# Patient Record
Sex: Male | Born: 1945 | Race: Black or African American | Hispanic: No | Marital: Single | Smoking: Current every day smoker
Health system: Southern US, Community
[De-identification: ages and names within clinical notes are randomized; demographics above are authoritative.]

## PROBLEM LIST (undated history)

## (undated) ENCOUNTER — Emergency Department (HOSPITAL_COMMUNITY): Disposition: A | Discharge status: Home / Self Care | Payer: Medicare Other

## (undated) DIAGNOSIS — I10 Essential (primary) hypertension: Secondary | ICD-10-CM

## (undated) DIAGNOSIS — Z923 Personal history of irradiation: Secondary | ICD-10-CM

## (undated) DIAGNOSIS — C3490 Malignant neoplasm of unspecified part of unspecified bronchus or lung: Secondary | ICD-10-CM

## (undated) HISTORY — PX: NO PAST SURGERIES: SHX2092

## (undated) HISTORY — DX: Malignant neoplasm of unspecified part of unspecified bronchus or lung: C34.90

---

## 2000-11-15 ENCOUNTER — Emergency Department (HOSPITAL_COMMUNITY): Admission: EM | Admit: 2000-11-15 | Discharge: 2000-11-15 | Payer: Self-pay | Admitting: Emergency Medicine

## 2000-11-15 ENCOUNTER — Encounter: Payer: Self-pay | Admitting: Emergency Medicine

## 2000-11-22 ENCOUNTER — Encounter: Payer: Self-pay | Admitting: Cardiology

## 2000-11-22 ENCOUNTER — Inpatient Hospital Stay (HOSPITAL_COMMUNITY): Admission: EM | Admit: 2000-11-22 | Discharge: 2000-11-23 | Payer: Self-pay | Admitting: Emergency Medicine

## 2000-11-23 ENCOUNTER — Encounter: Payer: Self-pay | Admitting: Cardiology

## 2000-11-26 ENCOUNTER — Ambulatory Visit (HOSPITAL_COMMUNITY): Admission: RE | Admit: 2000-11-26 | Discharge: 2000-11-26 | Payer: Self-pay | Admitting: Orthopaedic Surgery

## 2000-12-06 ENCOUNTER — Ambulatory Visit (HOSPITAL_BASED_OUTPATIENT_CLINIC_OR_DEPARTMENT_OTHER): Admission: RE | Admit: 2000-12-06 | Discharge: 2000-12-06 | Payer: Self-pay | Admitting: Orthopaedic Surgery

## 2005-09-24 ENCOUNTER — Emergency Department (HOSPITAL_COMMUNITY): Admission: EM | Admit: 2005-09-24 | Discharge: 2005-09-24 | Payer: Self-pay | Admitting: Emergency Medicine

## 2007-09-08 ENCOUNTER — Emergency Department (HOSPITAL_COMMUNITY): Admission: EM | Admit: 2007-09-08 | Discharge: 2007-09-08 | Payer: Self-pay | Admitting: Emergency Medicine

## 2007-09-16 ENCOUNTER — Emergency Department (HOSPITAL_COMMUNITY): Admission: EM | Admit: 2007-09-16 | Discharge: 2007-09-16 | Payer: Self-pay | Admitting: Internal Medicine

## 2010-05-19 NOTE — Op Note (Signed)
[place]. [facility]  Patient:    Carlos Beasley, Carlos Beasley Visit Number: [id number] MRN: [id number]          Service Type: DSU Location: [facility] 2857 01 Attending Physician:  Jacki Cones Dictated by:   Veverly Fells Ophelia Charter, M.D. Proc. Date: 11/28/00 Admit Date:  11/26/2000 Discharge Date: 11/26/2000                             Operative Report  PREOPERATIVE DIAGNOSIS:  Right fifth finger proximal phalanx fracture with angulation and displacement.  POSTOPERATIVE DIAGNOSIS:  Right fifth finger proximal phalanx fracture with angulation and displacement.  OPERATION PERFORMED:  Right fifth finger proximal phalanx fracture reduction and percutaneous pinning.  SURGEON:  Mark C. Ophelia Charter, M.D.  ANESTHESIA:  General.  DESCRIPTION OF PROCEDURE:  After induction of general anesthesia, standard prepping and draping under fluoroscopic visualization, reduction was performed on the proximal phalanx fracture which was at the junction of the metaphysis and shaft.  Fracture was ulnarly angulated and completely displaced with distal fragment volar to the proximal fragment.  The patient had previously been scheduled for this procedure as an outpatient but surgery had to be delayed due to EKG changes.  He is now nine to 10 days post fracture. Reduction was difficult, required multiple attempts to get satisfactory position and repositioning of pins.  After cross-pinning there was still five degrees of ulnar angulation.  Rotation was normal.  Checking both flexion and extension to make sure that cascade of rotation was appropriate.  The fracture was stable in pinned position and the patient was placed in an ulnar gutter splint with wrist extended, MCP flexed and PIP straight.  The pins were cut. Pin protectors were applied and the patient transferred to recovery room after extubation in stable condition. Dictated by:   Veverly Fells Ophelia Charter, M.D. Attending Physician:  Jacki Cones DD:  11/28/00 TD:  11/28/00 Job: [id number]

## 2010-05-19 NOTE — Op Note (Signed)
[place]. [facility]  Patient:    Carlos Beasley, Carlos Beasley Visit Number: [id number] MRN: [id number]          Service Type: DSU Location: [facility] 2857 01 Attending Physician:  Jacki Cones Dictated by:   Veverly Fells Ophelia Charter, M.D. Proc. Date: 11/26/00 Admit Date:  11/26/2000 Discharge Date: 11/26/2000                             Operative Report  PREOPERATIVE DIAGNOSIS:  Right fifth finger proximal phalanx fracture.  POSTOPERATIVE DIAGNOSIS:  Right fifth finger proximal phalanx fracture.  PROCEDURE:  Closed reduction, percutaneous pinning, right fifth finger proximal phalanx.  SURGEON:  Mark C. Ophelia Charter, M.D.  ANESTHESIA:  General.  TOURNIQUET TIME:  None.  DESCRIPTION OF PROCEDURE:  After the induction of general anesthesia, under fluoroscopic visualization after prepping with Duraprep, the finger was reduced with a combination of wrist extension, MCP flexion, distraction, and correction of the ulnar angulation at the fracture site.  Cross K-wire pins were drilled.  Initially pins were placed in the distal fragment using the pins to help manipulate, and then once satisfactory reduction was obtained, pins were passed across the fracture site into the proximal metaphyseal reaction but not across the joint.  After a satisfactory position was obtained, pins were cut, pin protectors placed, and an ulnar gutter splint with MCP flexed, PIP straight, and fourth and fifth finger both in the ulnar gutter splint.  The patient tolerated the procedure well, was transferred to the recovery room in stable condition. Dictated by:   Veverly Fells Ophelia Charter, M.D. Attending Physician:  Jacki Cones DD:  11/26/00 TD:  11/27/00 Job: [id number] [id number]

## 2010-05-19 NOTE — H&P (Signed)
[place]. [facility]  Patient:    Carlos Beasley, Carlos Beasley Visit Number: [id number] MRN: [id number]          Service Type: MED Location: 3700 3727 [id number] Attending Physician:  Learta Codding Dictated by:   Lewayne Bunting, M.D. [facility] Admit Date:  11/22/2000   CC:         Mark C. Ophelia Charter, M.D.   History and Physical  REFERRING PHYSICIAN:  Mark C. Ophelia Charter, M.D., of orthopedics surgery.  PRIMARY CARE PHYSICIAN:  None identified.  CURRENT COMPLAINTS:  Abnormal preoperative electrocardiogram.  HISTORY OF PRESENT ILLNESS:  Carlos Beasley is a 65 year old African-American male with no significant past medical history.  The patient was seen in preoperative evaluation for right hand surgery and was found to have an abnormal electrocardiogram.  His initial electrocardiogram showed biphasic T waves in V2 with ST elevation.  The patient was the referred to the emergency room for further evaluation.  He denies substernal chest pain, shortness of breath, orthopnea, or PND.  He has had no palpitations or syncope.  A repeat 12-lead electrocardiogram in the emergency room revealed left ventricular hypertrophy and nonspecific T-wave change, as well as early repolarization, most likely a normal variant.  The patient has had no prior cardiac history. In particular, there is no prior history of cardiac catheterization.  ALLERGIES:  None.  MEDICATIONS:  None.  SOCIAL HISTORY:  The patient lives in [place], [place], with his girlfriend.  He is occupied as a caddie.  ______.  He smokes two pack of cigarettes a day.  He likes walking.  He occasionally drinks beer.  He denies drug use.  He occasionally using ginseng.  FAMILY HISTORY:  Notable for his mother with coronary artery disease, but is alive and well.  She did have a CABG at age 53.  His father is alive and well. He has a sister who is alive and well.  REVIEW OF SYSTEMS:  No fevers, chills, or sweats.  No headaches or  visual abnormalities.  No skin rashes.  No chest pain, shortness of breath, or dyspnea on exertion.  No frequency or dysuria.  No nausea or vomiting.  No abdominal pain.  The remainder of the review of systems is within normal limits.  PHYSICAL EXAMINATION:  VITAL SIGNS:  Blood pressure 148/97 with a heart rate of 52 beats per minute. The temperature is 97.1 degrees.  Respirations are 20 breaths per minute and unlabored.  Saturation 100% on room air.  GENERAL APPEARANCE:  A well-nourished African-American male in no apparent distress.  HEENT:  PERRLA.  EOMI.  Sclerae clear.  NECK:  Supple.  No bruit.  No JVD.  No lymphadenopathy.  HEART:  Regular rate and rhythm.  Normal S1 and S2.  No murmurs, rubs, or gallops.  LUNGS:  Clear breath sounds bilaterally.  ABDOMEN:  Soft and nontender.  Good bowel sounds with no bruits.  SKIN:  No rashes.  GENITOURINARY:  Deferred.  RECTAL:  Deferred.  EXTREMITIES:  No clubbing, cyanosis, or edema.  No joint deformities or effusions.  NEUROLOGIC:  The patient is alert, oriented, and grossly nonfocal.  CHEST X-RAY:  Normal heart size, no infiltrates.  ELECTROCARDIOGRAM:  Heart rate 50 beats per minute, normal sinus rhythm, PR interval 158, QRS 96, QTC 426, LVH with secondary repolarization changes.  LABORATORY DATA:  CBC:  Hemoglobin 13.6, hematocrit 40.3, white count 44.2, platelet count 206.  Sodium 141, potassium 4.5, BUN 11, creatinine 1.1, glucose 97.  The first CK  was 46.  The PT and INR were within normal limits.  ASSESSMENT AND PLAN: 1. Abnormal electrocardiogram.  The patients EKG is consistent with left    ventricular hypertrophy with secondary repolarization changes.  There is no    obvious ischemia.  He is asymptomatic.  Given the fact that he has some    risk factors for coronary artery disease, the prudent approach would be to    admit the patient for 24 hours and obtain serial enzymes, particularly in    light of the  ST-T wave changes on his initial electrocardiogram.  The plan    is then to obtain an exercise Cardiolite study in the morning.  In the    interim, he can be started on aspirin. 2. Hypertension.  Agree with the addition of Altace 5 mg a day to his medical    regimen.  The patient likely has longstanding hypertension based on his    electrocardiographic changes. 3. Right hand surgery.  If the patients Cardiolite is within normal limits,    he can go ahead and have his surgery performed early next week. 4. Bradycardia.  This is of no significant concern.  The patient is    asymptomatic with normal vital signs.  DISPOSITION:  Admit for 24 hours and obtain an exercise Cardiolite in the morning. Dictated by:   Lewayne Bunting, M.D. [facility] Attending Physician:  Learta Codding DD:  11/22/00 TD:  11/22/00 Job: [id number]

## 2010-05-19 NOTE — Discharge Summary (Signed)
[place]. [facility]  Patient:    Carlos Beasley, Carlos Beasley Visit Number: [id number] MRN: [id number]          Service Type: MED Location: 3700 3727 [id number] Attending Physician:  Learta Codding Dictated by:   Rozell Searing, P.A. Admit Date:  11/22/2000 Disc. Date: 11/23/00   CC:         Mark C. Ophelia Charter, M.D.   Discharge Summary  PROCEDURE:  Exercise Cardiolite 11/23/00.  HOSPITAL COURSE:  [name] is a 65 year old male, with no prior history of heart disease, who was recently referred to Dr. Annell Greening for evaluation of possible right hand surgery.  He was found to have an abnormal EKG suggestive of inferolateral ischemia and was referred to the [facility] Emergency Room for further evaluation.  The patient was seen by Dr. Andee Lineman on presentation. Please refer to dictated admission note for full details.  The patient was brought in for further diagnostic evaluation.  Serial cardiac enzymes were negative for myocardial infarction.  Recommendation was to proceed with an exercise stress Cardiolite the following morning.  The patient exercised 10 minutes and 50 seconds on standard Bruce protocol, achieving 107 of predicted maximum heart rate; 13.1 METS.  Heart rate rose from 54 baseline, 78 maximum; blood pressure 140/84 baseline to 200/110 maximum with improvement to 142/90 at end of test.  The patient reported no chest pain during the procedure; no significant ST abnormalities noted on serial electrocardiograms.  Subsequent review of perfusion images revealed no evidence of ischemia with a question of apical thinning and increased LV cavity (study was not gated).  Chest x-ray on admission revealed no acute changes.  No further cardiac work-up was recommended, and patient was cleared for discharge following the results of the Cardiolite stress test.  Of note, the patient had been placed on Altace at time of this admission.  He denies any history of hypertension.  He was  engaged in a long discussion regarding this subject with Dr. Graciela Husbands, and final recommendations are for him to have ambulatory blood pressure checks for careful monitoring of possible hypertension.  Recommendation would be to resume Altace (he was placed on 5 mg q.d. this admission) given its proven benefits and large scale trials. We will, therefore, defer initiation of any antihypertensives at this time to his primary care physician.  He will otherwise return to Dr. Lewayne Bunting on as-needed basis.  LABORATORY DATA:  Metabolic profile normal.  Marginally decreased albumin at 3.4.  A normal CBC.  Cardiac enzymes:  CPK-MB negative x 3; troponin I 0.01 (x 3).  Lipid profile:  Total cholesterol 154, triglycerides 74, HDL 61, LDL 78.  TSH 1.51.  DISCHARGE MEDICATIONS:  None.  INSTRUCTIONS:  Return to Dr. Annell Greening for right hand surgery evaluation. The patient is also instructed to establish with a primary care physician for evaluation of possible hypertension.  DISCHARGE DIAGNOSES: 1. Abnormal electrocardiogram.    a. Negative serial cardiac enzymes.    b. Negative maximal exercise Cardiolite 11/23/00. 2. Borderline hypertension.    a. Question new onset. 3. Right hand injury.    a. Cleared from cardiac standpoint to proceed with surgery. 4. Tobacco. Dictated by:   Rozell Searing, P.A. Attending Physician:  Learta Codding DD:  11/23/00 TD:  11/23/00 Job: [id number]

## 2010-10-02 LAB — DIFFERENTIAL
Basophils Absolute: 0.1
Basophils Relative: 2 — ABNORMAL HIGH
Eosinophils Absolute: 0
Eosinophils Relative: 1
Monocytes Absolute: 0.3
Monocytes Relative: 9
Neutro Abs: 2.1

## 2010-10-02 LAB — BASIC METABOLIC PANEL
CO2: 30
Calcium: 8.9
Chloride: 105
Glucose, Bld: 136 — ABNORMAL HIGH
Sodium: 140

## 2010-10-02 LAB — URINALYSIS, ROUTINE W REFLEX MICROSCOPIC
Bilirubin Urine: NEGATIVE
Hgb urine dipstick: NEGATIVE
Ketones, ur: 15 — AB
Nitrite: NEGATIVE
Protein, ur: 30 — AB
Specific Gravity, Urine: 1.03
Urobilinogen, UA: 1

## 2010-10-02 LAB — CBC
Hemoglobin: 12.9 — ABNORMAL LOW
MCHC: 33.5
MCV: 93.6
RBC: 4.11 — ABNORMAL LOW
RDW: 13.3

## 2010-10-02 LAB — URINE MICROSCOPIC-ADD ON

## 2011-07-15 ENCOUNTER — Emergency Department (HOSPITAL_COMMUNITY)
Admission: EM | Admit: 2011-07-15 | Discharge: 2011-07-15 | Disposition: A | Discharge status: Home / Self Care | Payer: Medicare Other | Attending: Emergency Medicine | Admitting: Emergency Medicine

## 2011-07-15 ENCOUNTER — Encounter (HOSPITAL_COMMUNITY): Payer: Self-pay | Admitting: *Deleted

## 2011-07-15 DIAGNOSIS — H9209 Otalgia, unspecified ear: Secondary | ICD-10-CM | POA: Insufficient documentation

## 2011-07-15 DIAGNOSIS — R220 Localized swelling, mass and lump, head: Secondary | ICD-10-CM | POA: Insufficient documentation

## 2011-07-15 DIAGNOSIS — H6120 Impacted cerumen, unspecified ear: Secondary | ICD-10-CM | POA: Insufficient documentation

## 2011-07-15 DIAGNOSIS — F17200 Nicotine dependence, unspecified, uncomplicated: Secondary | ICD-10-CM | POA: Insufficient documentation

## 2011-07-15 DIAGNOSIS — H60399 Other infective otitis externa, unspecified ear: Secondary | ICD-10-CM | POA: Insufficient documentation

## 2011-07-15 DIAGNOSIS — H6090 Unspecified otitis externa, unspecified ear: Secondary | ICD-10-CM

## 2011-07-15 MED ORDER — ACETAMINOPHEN-CODEINE #3 300-30 MG PO TABS
2.0000 | ORAL_TABLET | Freq: Once | ORAL | Status: AC
  Administered 2011-07-15: 2 via ORAL
  Filled 2011-07-15: qty 2

## 2011-07-15 MED ORDER — CIPROFLOXACIN-DEXAMETHASONE 0.3-0.1 % OT SUSP: 4.0000 [drp] | Freq: Two times a day (BID) | OTIC | Status: AC

## 2011-07-15 MED ORDER — ACETAMINOPHEN-CODEINE #3 300-30 MG PO TABS: 1.0000 | ORAL_TABLET | Freq: Four times a day (QID) | ORAL | Status: AC | PRN

## 2011-07-15 NOTE — ED Provider Notes (Signed)
Medical screening examination/treatment/procedure(s) were performed by non-physician practitioner and as supervising physician I was immediately available for consultation/collaboration.  Leonora Gores, MD 07/15/11 2341 

## 2011-07-15 NOTE — ED Notes (Signed)
Pt c/o ear pain and swelling of R ear x 3 days. Pt states scratching at ear w/ copper wire x 1.5 weeks ago.

## 2011-07-15 NOTE — ED Provider Notes (Signed)
History     CSN: [id number]  Arrival date & time 07/15/11  2056   First MD Initiated Contact with Patient 07/15/11 2118      Chief Complaint  Patient presents with  . Otalgia    (Consider location/radiation/quality/duration/timing/severity/associated sxs/prior treatment) HPI  Patient presents to emergency department complaining of a three-day history of right ear pain and swelling. Patient states that about a week ago he noticed some itching in his right ear and scratch the inside of his ear with a copper wire to relieve the itch. Patient states that he didn't had no itching or problems with here until 3 days ago when he had gradual onset pain and burning of his left ear canal as well as some swelling within his ear. He denies any drainage. Patient states that he poured all of oil into the ear to help with the pain without any relief of pain. He is taken their aspirin with mild relief of pain. He denies fevers, chills, sore throat, headache, or dizziness. He denies pain or swelling of the outside portion of his ear. Pain is gradual onset, persistent, and worsening.  History reviewed. No pertinent past medical history.  History reviewed. No pertinent past surgical history.  History reviewed. No pertinent family history.  History  Substance Use Topics  . Smoking status: Current Everyday Smoker -- 1.0 packs/day    Types: Cigarettes  . Smokeless tobacco: Not on file  . Alcohol Use: Yes     occasionally      Review of Systems  All other systems reviewed and are negative.    Allergies  Review of patient's allergies indicates no known allergies.  Home Medications  No current outpatient prescriptions on file.  BP 168/86  Pulse 80  Temp 97.9 F (36.6 C) (Oral)  Resp 20  SpO2 100%  Physical Exam  Vitals reviewed. Constitutional: He is oriented to person, place, and time. He appears well-developed and well-nourished. No distress.  HENT:  Head: Normocephalic and  atraumatic.  Right Ear: External ear normal.  Left Ear: External ear normal.  Nose: Nose normal.  Mouth/Throat: No oropharyngeal exudate.       Mild erythema of posterior pharynx and tonsils no tonsillar exudate or enlargement. Patent airway. Swallowing secretions well  Cerumen impaction of right canal with edematous canal. TTP of tragus but no TTP or erythema of remainder of external ear or post auricular region.   Eyes: Conjunctivae are normal.  Neck: Normal range of motion. Neck supple.  Cardiovascular: Normal rate, regular rhythm and normal heart sounds.  Exam reveals no gallop and no friction rub.   No murmur heard. Pulmonary/Chest: Effort normal and breath sounds normal. No respiratory distress. He has no wheezes. He has no rales. He exhibits no tenderness.  Lymphadenopathy:    He has no cervical adenopathy.  Neurological: He is alert and oriented to person, place, and time. He has normal reflexes.  Skin: Skin is warm and dry. No rash noted. He is not diaphoretic. No erythema.  Psychiatric: He has a normal mood and affect.    ED Course  Procedures (including critical care time)  Cerumen impaction clear with irrigation with hydrogen peroxide with edematous erythematous canal after irrigation. TM intact and normal.   Labs Reviewed - No data to display No results found.   1. Otitis externa   2. Cerumen impaction       MDM  Cerumen impaction and OE following trauma with wire. TM intact. Afebrile. No evidence of Malignant OE.  Patient agreeable to abx ear drop and close fu with urgent care for recheck and avoiding any future FB placed in ear or use of olive oil in ear. Drucie Opitz, PA 07/15/11 2211

## 2012-03-03 ENCOUNTER — Encounter (HOSPITAL_COMMUNITY): Payer: Self-pay | Admitting: *Deleted

## 2012-03-03 ENCOUNTER — Emergency Department (HOSPITAL_COMMUNITY)
Admission: EM | Admit: 2012-03-03 | Discharge: 2012-03-03 | Disposition: A | Discharge status: Home / Self Care | Payer: Medicare Other | Attending: Emergency Medicine | Admitting: Emergency Medicine

## 2012-03-03 DIAGNOSIS — F17200 Nicotine dependence, unspecified, uncomplicated: Secondary | ICD-10-CM | POA: Insufficient documentation

## 2012-03-03 DIAGNOSIS — M79609 Pain in unspecified limb: Secondary | ICD-10-CM

## 2012-03-03 DIAGNOSIS — M7120 Synovial cyst of popliteal space [Baker], unspecified knee: Secondary | ICD-10-CM | POA: Insufficient documentation

## 2012-03-03 NOTE — Progress Notes (Signed)
*  Preliminary Results* Right lower extremity venous duplex completed. Right lower extremity is negative for deep vein thrombosis. There is evidence of a complex right Baker's cyst.  03/03/2012 4:00 PM Gertie Fey, RDMS, RDCS

## 2012-03-03 NOTE — ED Notes (Signed)
Pt states for the last 2 weeks has been having R leg/behing the knee pain, pt states it's a sore knot behind his knee.

## 2012-03-03 NOTE — ED Provider Notes (Signed)
History     CSN: [id number]  Arrival date & time 03/03/12  1212   First MD Initiated Contact with Patient 03/03/12 1250      Chief Complaint  Patient presents with  . Leg Pain    (Consider location/radiation/quality/duration/timing/severity/associated sxs/prior treatment) HPI Comments: Patient presenting with a chief complaint of pain of his right posterior knee.  He reports that he has been having the pain for the past 2 weeks.  Pain radiates down into his calf.  He has not taken anything for the pain.  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  He has never had anything like this before.  He thinks that he feels a knot behind his knee.  He also has had some swelling over the lateral part of his knee.  No erythema.  No warmth.  He denies fever or chills. Denies numbness or tingling.  Denies prior history of DVT or PE.  He denies prolonged travel or recent surgery in the past 4 weeks.    The history is provided by the patient.    History reviewed. No pertinent past medical history.  History reviewed. No pertinent past surgical history.  History reviewed. No pertinent family history.  History  Substance Use Topics  . Smoking status: Current Every Day Smoker -- 1.00 packs/day    Types: Cigarettes  . Smokeless tobacco: Not on file  . Alcohol Use: Yes     Comment: occasionally      Review of Systems  Constitutional: Negative for fever and chills.  Musculoskeletal: Positive for joint swelling.       Right knee pain  Skin: Negative for color change.  Neurological: Negative for numbness.  All other systems reviewed and are negative.    Allergies  Review of patient's allergies indicates no known allergies.  Home Medications  No current outpatient prescriptions on file.  BP 152/89  Pulse 56  Temp(Src) 98.2 F (36.8 C) (Oral)  Resp 18  SpO2 100%  Physical Exam  Nursing note and vitals reviewed. Constitutional: He appears well-developed and  well-nourished.  HENT:  Head: Normocephalic and atraumatic.  Mouth/Throat: Oropharynx is clear and moist.  Neck: Normal range of motion. Neck supple.  Cardiovascular: Normal rate, regular rhythm and normal heart sounds.   Pulses:      Dorsalis pedis pulses are 2+ on the right side, and 2+ on the left side.  Pulmonary/Chest: Effort normal and breath sounds normal.  Musculoskeletal:       Right shoulder: He exhibits normal range of motion, no bony tenderness, no effusion and normal pulse.  Mild swelling of the right knee medial to the patella Tightness and small mass of the posterior knee palpated.  No erythema or warmth of the right knee. Patient with full ROM of the right knee.  Neurological: He is alert. No sensory deficit. Gait normal.  Skin: Skin is warm and dry. No erythema.  Psychiatric: He has a normal mood and affect.    ED Course  Procedures (including critical care time)  Labs Reviewed - No data to display No results found.   No diagnosis found.    MDM  Patient presenting with pain to the right posterior knee.  Doppler ultrasound showing Baker's Cyst, negative for DVT.  Patient given referral to Orthopedics if symptoms do not improve.        Pascal Lux [place], PA-C 03/03/12 1824

## 2012-03-03 NOTE — ED Notes (Signed)
Vascular lab tech at bedside. 

## 2012-03-05 NOTE — ED Provider Notes (Signed)
Medical screening examination/treatment/procedure(s) were performed by non-physician practitioner and as supervising physician I was immediately available for consultation/collaboration.  Anthony T Allen, MD 03/05/12 1356 

## 2012-07-11 ENCOUNTER — Observation Stay (HOSPITAL_COMMUNITY)
Admission: EM | Admit: 2012-07-11 | Discharge: 2012-07-14 | Disposition: A | Discharge status: Home / Self Care | Payer: Medicare Other | Attending: Internal Medicine | Admitting: Internal Medicine

## 2012-07-11 ENCOUNTER — Emergency Department (HOSPITAL_COMMUNITY): Payer: Medicare Other

## 2012-07-11 ENCOUNTER — Encounter (HOSPITAL_COMMUNITY): Payer: Self-pay | Admitting: Emergency Medicine

## 2012-07-11 DIAGNOSIS — F17200 Nicotine dependence, unspecified, uncomplicated: Secondary | ICD-10-CM

## 2012-07-11 DIAGNOSIS — F1490 Cocaine use, unspecified, uncomplicated: Secondary | ICD-10-CM

## 2012-07-11 DIAGNOSIS — Z91199 Patient's noncompliance with other medical treatment and regimen due to unspecified reason: Secondary | ICD-10-CM | POA: Insufficient documentation

## 2012-07-11 DIAGNOSIS — I420 Dilated cardiomyopathy: Secondary | ICD-10-CM | POA: Diagnosis present

## 2012-07-11 DIAGNOSIS — Z9119 Patient's noncompliance with other medical treatment and regimen: Secondary | ICD-10-CM | POA: Insufficient documentation

## 2012-07-11 DIAGNOSIS — N179 Acute kidney failure, unspecified: Secondary | ICD-10-CM | POA: Insufficient documentation

## 2012-07-11 DIAGNOSIS — I10 Essential (primary) hypertension: Secondary | ICD-10-CM

## 2012-07-11 DIAGNOSIS — N183 Chronic kidney disease, stage 3 unspecified: Secondary | ICD-10-CM | POA: Insufficient documentation

## 2012-07-11 DIAGNOSIS — F1410 Cocaine abuse, uncomplicated: Secondary | ICD-10-CM

## 2012-07-11 DIAGNOSIS — Z720 Tobacco use: Secondary | ICD-10-CM | POA: Diagnosis present

## 2012-07-11 DIAGNOSIS — I428 Other cardiomyopathies: Secondary | ICD-10-CM | POA: Insufficient documentation

## 2012-07-11 DIAGNOSIS — R9431 Abnormal electrocardiogram [ECG] [EKG]: Secondary | ICD-10-CM

## 2012-07-11 DIAGNOSIS — R079 Chest pain, unspecified: Principal | ICD-10-CM

## 2012-07-11 DIAGNOSIS — I129 Hypertensive chronic kidney disease with stage 1 through stage 4 chronic kidney disease, or unspecified chronic kidney disease: Secondary | ICD-10-CM | POA: Insufficient documentation

## 2012-07-11 LAB — CBC
HCT: 37 % — ABNORMAL LOW (ref 39.0–52.0)
Hemoglobin: 12.2 g/dL — ABNORMAL LOW (ref 13.0–17.0)
Hemoglobin: 12.1 g/dL — ABNORMAL LOW (ref 13.0–17.0)
MCH: 30.4 pg (ref 26.0–34.0)
MCH: 30.8 pg (ref 26.0–34.0)
MCHC: 33 g/dL (ref 30.0–36.0)
MCV: 92.3 fL (ref 78.0–100.0)
MCV: 92.9 fL (ref 78.0–100.0)
RBC: 4.01 MIL/uL — ABNORMAL LOW (ref 4.22–5.81)
RBC: 3.93 MIL/uL — ABNORMAL LOW (ref 4.22–5.81)

## 2012-07-11 LAB — RAPID URINE DRUG SCREEN, HOSP PERFORMED
Barbiturates: NOT DETECTED
Cocaine: POSITIVE — AB

## 2012-07-11 LAB — BASIC METABOLIC PANEL
BUN: 15 mg/dL (ref 6–23)
CO2: 27 mEq/L (ref 19–32)
Calcium: 9.1 mg/dL (ref 8.4–10.5)
GFR calc non Af Amer: 50 mL/min — ABNORMAL LOW (ref >90)
Glucose, Bld: 141 mg/dL — ABNORMAL HIGH (ref 70–99)
Sodium: 140 mEq/L (ref 135–145)

## 2012-07-11 LAB — URINE MICROSCOPIC-ADD ON

## 2012-07-11 LAB — URINALYSIS, ROUTINE W REFLEX MICROSCOPIC
Bilirubin Urine: NEGATIVE
Leukocytes, UA: NEGATIVE
Nitrite: NEGATIVE
Specific Gravity, Urine: 1.034 — ABNORMAL HIGH (ref 1.005–1.030)
pH: 5 (ref 5.0–8.0)

## 2012-07-11 LAB — POCT I-STAT TROPONIN I: Troponin i, poc: 0.03 ng/mL (ref 0.00–0.08)

## 2012-07-11 MED ORDER — ACETAMINOPHEN 325 MG PO TABS: 650.0000 mg | ORAL_TABLET | Freq: Four times a day (QID) | ORAL | Status: DC | PRN

## 2012-07-11 MED ORDER — ONDANSETRON HCL 4 MG/2ML IJ SOLN: 4.0000 mg | Freq: Four times a day (QID) | INTRAMUSCULAR | Status: DC | PRN

## 2012-07-11 MED ORDER — AMLODIPINE BESYLATE 5 MG PO TABS
5.0000 mg | ORAL_TABLET | Freq: Every day | ORAL | Status: DC
  Administered 2012-07-11 – 2012-07-14 (×4): 5 mg via ORAL
  Filled 2012-07-11 (×4): qty 1

## 2012-07-11 MED ORDER — SODIUM CHLORIDE 0.9 % IV SOLN
INTRAVENOUS | Status: AC
  Administered 2012-07-11: 19:00:00 via INTRAVENOUS

## 2012-07-11 MED ORDER — ENOXAPARIN SODIUM 40 MG/0.4ML ~~LOC~~ SOLN
40.0000 mg | SUBCUTANEOUS | Status: DC
  Administered 2012-07-11 – 2012-07-13 (×3): 40 mg via SUBCUTANEOUS
  Filled 2012-07-11 (×4): qty 0.4

## 2012-07-11 MED ORDER — ASPIRIN 81 MG PO CHEW
324.0000 mg | CHEWABLE_TABLET | Freq: Once | ORAL | Status: AC
  Administered 2012-07-11: 324 mg via ORAL
  Filled 2012-07-11: qty 4

## 2012-07-11 MED ORDER — ONDANSETRON HCL 4 MG PO TABS: 4.0000 mg | ORAL_TABLET | Freq: Four times a day (QID) | ORAL | Status: DC | PRN

## 2012-07-11 MED ORDER — ALBUTEROL SULFATE (5 MG/ML) 0.5% IN NEBU: 2.5000 mg | INHALATION_SOLUTION | RESPIRATORY_TRACT | Status: DC | PRN

## 2012-07-11 MED ORDER — MORPHINE SULFATE 2 MG/ML IJ SOLN: 1.0000 mg | INTRAMUSCULAR | Status: DC | PRN

## 2012-07-11 MED ORDER — ASPIRIN EC 325 MG PO TBEC
325.0000 mg | DELAYED_RELEASE_TABLET | Freq: Every day | ORAL | Status: DC
  Administered 2012-07-11 – 2012-07-14 (×4): 325 mg via ORAL
  Filled 2012-07-11 (×4): qty 1

## 2012-07-11 MED ORDER — ALUM & MAG HYDROXIDE-SIMETH 200-200-20 MG/5ML PO SUSP: 30.0000 mL | Freq: Four times a day (QID) | ORAL | Status: DC | PRN

## 2012-07-11 MED ORDER — SODIUM CHLORIDE 0.9 % IJ SOLN
3.0000 mL | Freq: Two times a day (BID) | INTRAMUSCULAR | Status: DC
  Administered 2012-07-12 – 2012-07-13 (×3): 3 mL via INTRAVENOUS

## 2012-07-11 MED ORDER — NITROGLYCERIN 0.4 MG SL SUBL
0.4000 mg | SUBLINGUAL_TABLET | SUBLINGUAL | Status: DC | PRN
  Administered 2012-07-11 (×3): 0.4 mg via SUBLINGUAL
  Filled 2012-07-11: qty 25

## 2012-07-11 MED ORDER — HYDROCODONE-ACETAMINOPHEN 5-325 MG PO TABS: 1.0000 | ORAL_TABLET | ORAL | Status: DC | PRN

## 2012-07-11 MED ORDER — ACETAMINOPHEN 650 MG RE SUPP: 650.0000 mg | Freq: Four times a day (QID) | RECTAL | Status: DC | PRN

## 2012-07-11 MED ORDER — NITROGLYCERIN 0.4 MG SL SUBL
0.4000 mg | SUBLINGUAL_TABLET | SUBLINGUAL | Status: AC | PRN
  Administered 2012-07-11 (×3): 0.4 mg via SUBLINGUAL
  Filled 2012-07-11: qty 25

## 2012-07-11 NOTE — Consult Note (Signed)
Reason for Consult: Chest pain Referring Physician: [facility] Primary cardiologist: None  Carlos Beasley is an 67 y.o. male.  HPI: This 67 year old African American gentleman came to the emergency room after developing left-sided steady chest discomfort while at work.  He had been lifting some air-conditioner parts but they were not particularly heavy.  He states the chest discomfort lasted about 5 hours until the took a total of 3 sublingual nitroglycerin in the emergency room after which she experienced complete relief of pain and the pain has not recurred.  He denies any dyspnea or diaphoresis.  No nausea or vomiting associated with the chest pain.  Of note is the fact that earlier today he told his admitting physician that he had used cocaine last night.  He denied any illicit drug use to me today.  A drug screen has been requested and is pending.  The patient denies any prior history of known heart problems.  There is a family history of heart problems.  His mother died of heart trouble at 72 and he has a sister who died of a heart attack in her sleep.  Past Medical History  Diagnosis Date  . Medical history non-contributory     Past Surgical History  Procedure Laterality Date  . No past surgeries      Family History  Problem Relation Age of Onset  . Heart Problems Mother     Social History:  reports that he has been smoking Cigarettes.  He has a 12.5 pack-year smoking history. He has never used smokeless tobacco. He reports that  drinks alcohol. He reports that he uses illicit drugs (Cocaine).  Allergies: No Known Allergies  Medications:  Scheduled: . amLODipine  5 mg Oral Daily  . aspirin EC  325 mg Oral Daily  . enoxaparin (LOVENOX) injection  40 mg Subcutaneous Q24H  . sodium chloride  3 mL Intravenous Q12H    Results for orders placed during the hospital encounter of 07/11/12 (from the past 48 hour(s))  CBC     Status: Abnormal   Collection Time    07/11/12   2:33 PM      Result Value Range   WBC 4.8  4.0 - 10.5 K/uL   RBC 4.01 (*) 4.22 - 5.81 MIL/uL   Hemoglobin 12.2 (*) 13.0 - 17.0 g/dL   HCT 86.5 (*) 78.4 - 69.6 %   MCV 92.3  78.0 - 100.0 fL   MCH 30.4  26.0 - 34.0 pg   MCHC 33.0  30.0 - 36.0 g/dL   RDW 29.5  28.4 - 13.2 %   Platelets 197  150 - 400 K/uL  BASIC METABOLIC PANEL     Status: Abnormal   Collection Time    07/11/12  2:33 PM      Result Value Range   Sodium 140  135 - 145 mEq/L   Potassium 3.5  3.5 - 5.1 mEq/L   Chloride 105  96 - 112 mEq/L   CO2 27  19 - 32 mEq/L   Glucose, Bld 141 (*) 70 - 99 mg/dL   BUN 15  6 - 23 mg/dL   Creatinine, Ser 4.40 (*) 0.50 - 1.35 mg/dL   Calcium 9.1  8.4 - 10.2 mg/dL   GFR calc non Af Amer 50 (*) >90 mL/min   GFR calc Af Amer 58 (*) >90 mL/min   Comment:            The eGFR has been calculated  using the CKD EPI equation.     This calculation has not been     validated in all clinical     situations.     eGFR's persistently     <90 mL/min signify     possible Chronic Kidney Disease.  TROPONIN I     Status: None   Collection Time    07/11/12  2:33 PM      Result Value Range   Troponin I <0.30  <0.30 ng/mL   Comment:            Due to the release kinetics of cTnI,     a negative result within the first hours     of the onset of symptoms does not rule out     myocardial infarction with certainty.     If myocardial infarction is still suspected,     repeat the test at appropriate intervals.  POCT I-STAT TROPONIN I     Status: None   Collection Time    07/11/12  2:34 PM      Result Value Range   Troponin i, poc 0.03  0.00 - 0.08 ng/mL   Comment 3            Comment: Due to the release kinetics of cTnI,     a negative result within the first hours     of the onset of symptoms does not rule out     myocardial infarction with certainty.     If myocardial infarction is still suspected,     repeat the test at appropriate intervals.    Dg Chest 2 View  07/11/2012    *RADIOLOGY REPORT*  Clinical Data: Chest pain.  CHEST - 2 VIEW  Comparison:  None.  Findings:  The heart size and mediastinal contours are within normal limits.  Both lungs are clear.  The visualized skeletal structures are unremarkable.  IMPRESSION: No active cardiopulmonary disease.   Original Report Authenticated By: Myles Rosenthal, M.D.    Review of systems is unremarkable except for present illness.  He is generally healthy.  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  He has good exercise tolerance. Blood pressure 151/92, pulse 54, temperature 97.5 F (36.4 C), temperature source Oral, resp. rate 20, height 6\' 1"  (1.854 m), weight 160 lb 12.8 oz (72.938 kg), SpO2 100.00%. The patient appears to be in no distress.  Head and neck exam reveals that the pupils are equal and reactive.  The extraocular movements are full.  There is no scleral icterus.  Mouth and pharynx are benign.  No lymphadenopathy.  No carotid bruits.  The jugular venous pressure is normal.  Thyroid is not enlarged or tender.  Chest is clear to percussion and auscultation.  No rales or rhonchi.  Expansion of the chest is symmetrical.  Heart reveals a prominent left ventricular lift.  No murmur gallop rub or click.  The abdomen is soft and nontender.  Bowel sounds are normoactive.  There is no hepatosplenomegaly or mass.  There are no abdominal bruits.  Extremities reveal no phlebitis or edema.  Pedal pulses are good.  There is no cyanosis or clubbing.  Neurologic exam is normal strength and no lateralizing weakness.  No sensory deficits.  Integument reveals no rash  EKG on admission shows a pattern of widespread ST-T wave abnormalities.  The QTc interval was prolonged.  There is right axis deviation. No prior tracings available for comparison.  Assessment/Plan: 1.  Chest pain uncertain etiology.  Possible coronary spasm in the face of history of recent cocaine abuse. Initial troponins are normal 2.  hypertension,  untreated  Plan: Obtain serial enzymes.  Recheck EKG in a.m.  Obtain echocardiogram looking for wall motion abnormalities and overall left ventricular function.  No indication for cardiac catheterization at this point.  Check lipids.  Counsel regarding dangers of cocaine use.  Agree with use of aspirin and amlodipine.  Cassell Clement 07/11/2012, 6:47 PM

## 2012-07-11 NOTE — ED Provider Notes (Signed)
History    CSN: [id number] Arrival date & time 07/11/12  1410  First MD Initiated Contact with Patient 07/11/12 1437     Chief Complaint  Patient presents with  . Chest Pain   (Consider location/radiation/quality/duration/timing/severity/associated sxs/prior Treatment) HPI Comments: 67 yo male with smoking hx, no known heart issues presents with left sided chest pain since 10 am, constant, mild ache, different than previous.  No diaphoresis, vomiting or exertional sxs.  Pt tried cocaine last night for first time.  Non radiating.  No blood clot hx or recent surgery.  Patient is a 67 y.o. male presenting with chest pain. The history is provided by the patient.  Chest Pain Pain location:  L chest Associated symptoms: no abdominal pain, no back pain, no fever, no headache, no shortness of breath and not vomiting    History reviewed. No pertinent past medical history. History reviewed. No pertinent past surgical history. No family history on file. History  Substance Use Topics  . Smoking status: Current Every Day Smoker -- 0.50 packs/day for 25 years    Types: Cigarettes  . Smokeless tobacco: Never Used  . Alcohol Use: Yes     Comment: occasionally    Review of Systems  Constitutional: Negative for fever and chills.  HENT: Negative for neck pain and neck stiffness.   Eyes: Negative for visual disturbance.  Respiratory: Negative for shortness of breath.   Cardiovascular: Positive for chest pain.  Gastrointestinal: Negative for vomiting and abdominal pain.  Genitourinary: Negative for dysuria and flank pain.  Musculoskeletal: Negative for back pain.  Skin: Negative for rash.  Neurological: Negative for light-headedness and headaches.    Allergies  Review of patient's allergies indicates no known allergies.  Home Medications  No current outpatient prescriptions on file. BP 149/80  Pulse 63  Temp(Src) 98 F (36.7 C) (Oral)  Resp 16  SpO2 100% Physical Exam  Nursing  note and vitals reviewed. Constitutional: He is oriented to person, place, and time. He appears well-developed and well-nourished.  HENT:  Head: Normocephalic and atraumatic.  Eyes: Conjunctivae are normal. Right eye exhibits no discharge. Left eye exhibits no discharge.  Neck: Normal range of motion. Neck supple. No tracheal deviation present.  Cardiovascular: Normal rate, regular rhythm and intact distal pulses.   No murmur heard. Pulmonary/Chest: Effort normal and breath sounds normal.  Abdominal: Soft. He exhibits no distension. There is no tenderness. There is no guarding.  Musculoskeletal: He exhibits no edema and no tenderness.  Neurological: He is alert and oriented to person, place, and time.  Skin: Skin is warm. No rash noted.  Psychiatric: He has a normal mood and affect.    ED Course  Procedures (including critical care time) Labs Reviewed  CBC - Abnormal; Notable for the following:    RBC 4.01 (*)    Hemoglobin 12.2 (*)    HCT 37.0 (*)    All other components within normal limits  BASIC METABOLIC PANEL - Abnormal; Notable for the following:    Glucose, Bld 141 (*)    Creatinine, Ser 1.41 (*)    GFR calc non Af Amer 50 (*)    GFR calc Af Amer 58 (*)    All other components within normal limits  TROPONIN I  URINE RAPID DRUG SCREEN (HOSP PERFORMED)  POCT I-STAT TROPONIN I   Dg Chest 2 View  07/11/2012   *RADIOLOGY REPORT*  Clinical Data: Chest pain.  CHEST - 2 VIEW  Comparison:  None.  Findings:  The  heart size and mediastinal contours are within normal limits.  Both lungs are clear.  The visualized skeletal structures are unremarkable.  IMPRESSION: No active cardiopulmonary disease.   Original Report Authenticated By: Myles Rosenthal, M.D.   No diagnosis found.  MDM  No pcp or cardiac work up in the past.  With abnormal ekg, age, smoking, cocaine and unknown other medical hx concern for CAD. Repeat EKG no changes.  ASA given.   Pain resolved with nitro.  Paged [facility]  hospitalist for observation.    Date: 07/11/2012  Rate: 61  Rhythm: normal sinus rhythm  QRS Axis: right  Intervals: normal  ST/T Wave abnormalities: T wave inversions anter lateral  Conduction Disutrbances:none  Narrative Interpretation:   Old EKG Reviewed: changes noted  Date: 07/11/2012  Rate: 61  Rhythm: normal sinus rhythm  QRS Axis: right  Intervals: normal  ST/T Wave abnormalities: T wave inversions anter lateral  Conduction Disutrbances:none  Narrative Interpretation:   Old EKG Reviewed: no changes  Spoke with Dr Angelia Mould, accepted for admission.    Enid Skeens, MD 07/11/12 1630

## 2012-07-11 NOTE — Progress Notes (Signed)
UR completed 

## 2012-07-11 NOTE — Progress Notes (Signed)
Patient does not want to sign up for My Chart. Briscoe Burns BSN, RN-BC Admissions RN  07/11/2012 4:54 PM

## 2012-07-11 NOTE — ED Notes (Signed)
Pt reports 7/10 left sided chest pain. Pt denies shortness of breath, nausea, vomiting, radiation to arm, neck, jaw, or back. Pt reports being a 0.5 pack per day smoker.

## 2012-07-11 NOTE — Progress Notes (Signed)
   CARE MANAGEMENT ED NOTE 07/11/2012  Patient:  Carlos Beasley, Carlos Beasley   Account Number:  [id number]  Date Initiated:  07/11/2012  Documentation initiated by:  Radford Pax  Subjective/Objective Assessment:   Patient presents to ED with left sided chest pain.     Subjective/Objective Assessment Detail:     Action/Plan:   Action/Plan Detail:   Anticipated DC Date:       Status Recommendation to Physician:   Result of Recommendation:    Other ED Services  Consult Working Plan    DC Planning Services  CM consult  Other  PCP issues    Choice offered to / List presented to:            Status of service:  Completed, signed off  ED Comments:   ED Comments Detail:  [facility] consulted to see patient regarding pcp issues. Instructed patient to caal the phone number on the back of his insurance card so that he can find a doctor who is within network.  Also printed up list of physicians who accept patient's insurance within 10 miles of patient's zip code.  Patient thankful for information.

## 2012-07-11 NOTE — H&P (Addendum)
PATIENT DETAILS Name: Carlos Beasley Age: 67 y.o. Sex: male Date of Birth: 1945/09/10 Admit Date: 07/11/2012 PCP:No primary provider on file.   CHIEF COMPLAINT:  Left-sided chest pain  HPI: Carlos Beasley is a 67 y.o. male with a Past Medical History of hypertension-noncompliant to medications, tobacco use who presents today with the above noted complaint. The patient claims that, while at work today around 10 AM, patient started to experience left-sided chest pain, sharp in nature, without any radiation, with no associated nausea, vomiting, shortness of breath or diaphoresis, he then proceeded to come to the emergency room where he was given 3 sublingual nitroglycerin with complete relief of his chest. During my evaluation, patient did not complain of any chest pain. He was lying very comfortably in bed. Patient claims to have used cocaine last night, he claims that he did use cocaine 2 weeks ago as well. Apart from these 2 episodes, he claims that he has not used further cocaine. He denies any history of headaches, fever, abdominal pain, diarrhea or dysuria. In the emergency room, he was found to have T-wave inversions in the inferior lateral leads, I was asked to admit this patient for further evaluation and treatment.   ALLERGIES:  No Known Allergies  PAST MEDICAL HISTORY: Past Medical History  Diagnosis Date  . Medical history non-contributory     PAST SURGICAL HISTORY: Past Surgical History  Procedure Laterality Date  . No past surgeries      MEDICATIONS AT HOME: Prior to Admission medications   Not on File    FAMILY HISTORY: Family History  Problem Relation Age of Onset  . Heart Problems Mother     SOCIAL HISTORY:  reports that he has been smoking Cigarettes.  He has a 12.5 pack-year smoking history. He has never used smokeless tobacco. He reports that  drinks alcohol. He reports that he uses illicit drugs (Cocaine).  REVIEW OF SYSTEMS:  Constitutional:   No   weight loss, night sweats,  Fevers, chills, fatigue.  HEENT:    No headaches, Difficulty swallowing,Tooth/dental problems,Sore throat,  No sneezing, itching, ear ache, nasal congestion, post nasal drip,   Cardio-vascular: No  Orthopnea, PND, swelling in lower extremities, anasarca,  dizziness, palpitations  GI:  No heartburn, indigestion, abdominal pain, nausea, vomiting, diarrhea, change in  bowel habits, loss of appetite  Resp: No shortness of breath with exertion or at rest.  No excess mucus, no productive cough, No non-productive cough,  No coughing up of blood.No change in color of mucus.No wheezing.No chest wall deformity  Skin:  no Beasley or lesions.  GU:  no dysuria, change in color of urine, no urgency or frequency.  No flank pain.  Musculoskeletal: No joint pain or swelling.  No decreased range of motion.  No back pain.  Psych: No change in mood or affect. No depression or anxiety.  No memory loss.   PHYSICAL EXAM: Blood pressure 143/93, pulse 58, temperature 98 F (36.7 C), temperature source Oral, resp. rate 19, SpO2 100.00%.  General appearance :Awake, alert, not in any distress. Speech Clear. Not toxic Looking HEENT: Atraumatic and Normocephalic, pupils equally reactive to light and accomodation Neck: supple, no JVD. No cervical lymphadenopathy.  Chest:Good air entry bilaterally, no added sounds  CVS: S1 S2 regular, no murmurs.  Abdomen: Bowel sounds present, Non tender and not distended with no gaurding, rigidity or rebound. Extremities: B/L Lower Ext shows no edema, both legs are warm to touch Neurology: Awake alert, and oriented X 3,  CN II-XII intact, Non focal Skin:No Beasley Wounds:N/A  LABS ON ADMISSION:   Recent Labs  07/11/12 1433  NA 140  K 3.5  CL 105  CO2 27  GLUCOSE 141*  BUN 15  CREATININE 1.41*  CALCIUM 9.1   No results found for this basename: AST, ALT, ALKPHOS, BILITOT, PROT, ALBUMIN,  in the last 72 hours No results found for this  basename: LIPASE, AMYLASE,  in the last 72 hours  Recent Labs  07/11/12 1433  WBC 4.8  HGB 12.2*  HCT 37.0*  MCV 92.3  PLT 197    Recent Labs  07/11/12 1433  TROPONINI <0.30   No results found for this basename: DDIMER,  in the last 72 hours No components found with this basename: POCBNP,    RADIOLOGIC STUDIES ON ADMISSION: Dg Chest 2 View  07/11/2012   *RADIOLOGY REPORT*  Clinical Data: Chest pain.  CHEST - 2 VIEW  Comparison:  None.  Findings:  The heart size and mediastinal contours are within normal limits.  Both lungs are clear.  The visualized skeletal structures are unremarkable.  IMPRESSION: No active cardiopulmonary disease.   Original Report Authenticated By: Myles Rosenthal, M.D.     EKG: Independently reviewed. Normal sinus rhythm with inferior lateral T-wave inversions-not known whether this is ordered the  ASSESSMENT AND PLAN: Present on Admission:  . Chest pain - Admit to telemetry, cycle cardiac enzymes. Place on aspirin.  - Given EKG changes, we'll consult cardiology and also order a 2-D echocardiogram to assess for wall motion abnormality.   . Tobacco abuse - Counseled extensively. Currently patient does not have any desire to quit.   Marland Kitchen HTN (hypertension) - Start low-dose amlodipine, and titrate accordingly   . 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  . Cocaine abuse - Counseled extensively-regarding the harmful effects of cocaine   Above outline plan was discussed with patient, who was in full agreement.  Further plan will depend as patient's clinical course evolves and further radiologic and laboratory data become available. Patient will be monitored closely.   DVT Prophylaxis: Prophylactic Lovenox   Code Status: Full Code  Total time spent for admission equals 45 minutes.  [facility] Pager [phone]  If 7PM-7AM, please  contact night-coverage [email or web address] Password [facility] 07/11/2012, 4:54 PM

## 2012-07-12 DIAGNOSIS — I420 Dilated cardiomyopathy: Secondary | ICD-10-CM | POA: Diagnosis present

## 2012-07-12 DIAGNOSIS — R9431 Abnormal electrocardiogram [ECG] [EKG]: Secondary | ICD-10-CM

## 2012-07-12 LAB — COMPREHENSIVE METABOLIC PANEL
AST: 12 U/L (ref 0–37)
BUN: 15 mg/dL (ref 6–23)
CO2: 29 mEq/L (ref 19–32)
Chloride: 107 mEq/L (ref 96–112)
Creatinine, Ser: 1.36 mg/dL — ABNORMAL HIGH (ref 0.50–1.35)
GFR calc non Af Amer: 52 mL/min — ABNORMAL LOW (ref >90)
Glucose, Bld: 81 mg/dL (ref 70–99)
Total Bilirubin: 0.3 mg/dL (ref 0.3–1.2)

## 2012-07-12 LAB — LIPID PANEL
Cholesterol: 147 mg/dL (ref 0–200)
Total CHOL/HDL Ratio: 2 RATIO
VLDL: 12 mg/dL (ref 0–40)

## 2012-07-12 LAB — TROPONIN I: Troponin I: <0.3 ng/mL (ref <0.30)

## 2012-07-12 LAB — CBC
HCT: 37 % — ABNORMAL LOW (ref 39.0–52.0)
Hemoglobin: 12 g/dL — ABNORMAL LOW (ref 13.0–17.0)
MCV: 93 fL (ref 78.0–100.0)
Platelets: 182 10*3/uL (ref 150–400)
RBC: 3.98 MIL/uL — ABNORMAL LOW (ref 4.22–5.81)
WBC: 3.6 10*3/uL — ABNORMAL LOW (ref 4.0–10.5)

## 2012-07-12 MED ORDER — LISINOPRIL 5 MG PO TABS
5.0000 mg | ORAL_TABLET | Freq: Every day | ORAL | Status: DC
  Administered 2012-07-12 – 2012-07-14 (×3): 5 mg via ORAL
  Filled 2012-07-12 (×3): qty 1

## 2012-07-12 NOTE — Progress Notes (Signed)
Patient ID: EDON HOADLEY, male   DOB: 06-Aug-1945, 67 y.o.   MRN: [id number] [facility] PROGRESS NOTE  COLBY REELS WUJ:[id number] DOB: Nov 24, 1945 DOA: 07/11/2012 PCP: No primary provider on file.  Brief narrative: 67 y.o. male with past medical history of hypertension-noncompliant with medications, tobacco and cocaine abuse who presented to [facility] ED with main concern of left sided chest pain that started several hours prior to admission, sharp and constant, 7/10 in severity and non radiating with no specific alleviating or aggravating factors. He was given 3 tablets of SL NTG with complete resolution of CP in ED but 12 lead EKG showed T-wave inversion in the inferior lateral leads. Pt reports using cocaine prior to the onset of chest pain. [facility] asked to admit for further evaluation.   Principal Problem:   Chest pain - likely secondary to cocaine induced vasospasm, now resolved per pt - 4 sets of CE's with normal troponin, appreciate cardiology involvement - 2 D ECHO shows moderate to severe reduction in systolic function with ED 35% - continue to monitor on tele and follow up on cardiology recommendations Active Problems:   Tobacco abuse - cessation discussed in detail   Acute renal failure - creatinine is trending down, close monitoring of renal function  - BMP in AM   HTN (hypertension) - reasonable control over 24 hours  - continue Norvasc    Cocaine abuse - cessation discussed in detail   Consultants:  Cardiology Procedures/Studies: Dg Chest 2 View 07/11/2012  No active cardiopulmonary disease.    Antibiotics:  None  Code Status: Full Family Communication: Pt at bedside Disposition Plan: Home when medically stable  HPI/Subjective: No events overnight.   Objective: Filed Vitals:   07/11/12 1700 07/11/12 1830 07/11/12 2045 07/12/12 1114  BP: 137/86 151/92 154/91 143/87  Pulse: 53 54 76   Temp:  97.5 F (36.4 C) 98.2 F (36.8 C)   TempSrc:  Oral Oral   Resp: 20  20 18    Height:  6\' 1"  (1.854 m)    Weight:  72.938 kg (160 lb 12.8 oz)    SpO2: 100% 100% 100%     Intake/Output Summary (Last 24 hours) at 07/12/12 1208 Last data filed at 07/12/12 0805  Gross per 24 hour  Intake 869.17 ml  Output    500 ml  Net 369.17 ml    Exam:   General:  Pt is alert, follows commands appropriately, not in acute distress  Cardiovascular: Regular rhythm, bradycardic, S1/S2, no murmurs, no rubs, no gallops  Respiratory: Clear to auscultation bilaterally, no wheezing, no crackles, no rhonchi  Abdomen: Soft, non tender, non distended, bowel sounds present, no guarding  Extremities: No edema, pulses DP and PT palpable bilaterally  Neuro: Grossly nonfocal  Data Reviewed: Basic Metabolic Panel:  Recent Labs Lab 07/11/12 1433 07/11/12 1921 07/12/12 0712  NA 140  --  141  K 3.5  --  4.0  CL 105  --  107  CO2 27  --  29  GLUCOSE 141*  --  81  BUN 15  --  15  CREATININE 1.41* 1.51* 1.36*  CALCIUM 9.1  --  8.7   Liver Function Tests:  Recent Labs Lab 07/12/12 0712  AST 12  ALT 8  ALKPHOS 40  BILITOT 0.3  PROT 6.1  ALBUMIN 3.0*   CBC:  Recent Labs Lab 07/11/12 1433 07/11/12 1921 07/12/12 0712  WBC 4.8 5.1 3.6*  HGB 12.2* 12.1* 12.0*  HCT 37.0* 36.5* 37.0*  MCV  92.3 92.9 93.0  PLT 197 166 182   Cardiac Enzymes:  Recent Labs Lab 07/11/12 1433 07/11/12 1921 07/12/12 0105 07/12/12 0712  TROPONINI <0.30 <0.30 <0.30 <0.30   Scheduled Meds: . amLODipine  5 mg Oral Daily  . aspirin EC  325 mg Oral Daily  . enoxaparin (LOVENOX) injection  40 mg Subcutaneous Q24H  . sodium chloride  3 mL Intravenous Q12H   Continuous Infusions:   Debbora Presto, MD  [facility] Pager [phone]  If 7PM-7AM, please contact night-coverage [email or web address] Password [email or web address] 07/12/2012, 12:08 PM   LOS: 1 day

## 2012-07-12 NOTE — Progress Notes (Signed)
SUBJECTIVE:  No further CP OBJECTIVE:   Vitals:   Filed Vitals:   07/11/12 1830 07/11/12 2045 07/12/12 1114 07/12/12 1520  BP: 151/92 154/91 143/87 157/86  Pulse: 54 76  55  Temp: 97.5 F (36.4 C) 98.2 F (36.8 C)  98.6 F (37 C)  TempSrc: Oral Oral  Oral  Resp: 20 18  18   Height: 6\' 1"  (1.854 m)     Weight: 72.938 kg (160 lb 12.8 oz)     SpO2: 100% 100%  100%   I&O's:   Intake/Output Summary (Last 24 hours) at 07/12/12 1602 Last data filed at 07/12/12 1500  Gross per 24 hour  Intake 1229.17 ml  Output    800 ml  Net 429.17 ml   TELEMETRY: Reviewed telemetry pt in NSR:     PHYSICAL EXAM General: Well developed, well nourished, in no acute distress Head: Eyes PERRLA, No xanthomas.   Normal cephalic and atramatic  Lungs:   Clear bilaterally to auscultation and percussion. Heart:   HRRR S1 S2 Pulses are 2+ & equal. Abdomen: Bowel sounds are positive, abdomen soft and non-tender without masses  Extremities:   No clubbing, cyanosis or edema.  DP +1 Neuro: Alert and oriented X 3. Psych:  Good affect, responds appropriately   LABS: Basic Metabolic Panel:  Recent Labs  16/10/96 1433 07/11/12 1921 07/12/12 0712  NA 140  --  141  K 3.5  --  4.0  CL 105  --  107  CO2 27  --  29  GLUCOSE 141*  --  81  BUN 15  --  15  CREATININE 1.41* 1.51* 1.36*  CALCIUM 9.1  --  8.7   Liver Function Tests:  Recent Labs  07/12/12 0712  AST 12  ALT 8  ALKPHOS 40  BILITOT 0.3  PROT 6.1  ALBUMIN 3.0*   No results found for this basename: LIPASE, AMYLASE,  in the last 72 hours CBC:  Recent Labs  07/11/12 1921 07/12/12 0712  WBC 5.1 3.6*  HGB 12.1* 12.0*  HCT 36.5* 37.0*  MCV 92.9 93.0  PLT 166 182   Cardiac Enzymes:  Recent Labs  07/11/12 1921 07/12/12 0105 07/12/12 0712  TROPONINI <0.30 <0.30 <0.30   BNP: No components found with this basename: POCBNP,  D-Dimer: No results found for this basename: DDIMER,  in the last 72 hours Hemoglobin A1C:  Recent  Labs  07/11/12 1921  HGBA1C 5.6   Fasting Lipid Panel:  Recent Labs  07/12/12 0712  CHOL 147  HDL 72  LDLCALC 63  TRIG 61  CHOLHDL 2.0   Thyroid Function Tests: No results found for this basename: TSH, T4TOTAL, FREET3, T3FREE, THYROIDAB,  in the last 72 hours Anemia Panel: No results found for this basename: VITAMINB12, FOLATE, FERRITIN, TIBC, IRON, RETICCTPCT,  in the last 72 hours Coag Panel:   No results found for this basename: INR, PROTIME    RADIOLOGY: Dg Chest 2 View  07/11/2012   *RADIOLOGY REPORT*  Clinical Data: Chest pain.  CHEST - 2 VIEW  Comparison:  None.  Findings:  The heart size and mediastinal contours are within normal limits.  Both lungs are clear.  The visualized skeletal structures are unremarkable.  IMPRESSION: No active cardiopulmonary disease.   Original Report Authenticated By: Myles Rosenthal, M.D.    Assessment/Plan:  1. Chest pain uncertain etiology. Possible coronary spasm in the face of history of recent cocaine abuse. Cardiac enzymes all negative. 2. Hypertension poorly controlled 3.  Medical noncompliance 4.  Polysubstance abuse with tobacco and cocaine 5.  Moderate LV dysfunction EF 30-35% with ? PFO  Plan:  - Counsel regarding dangers of cocaine use.  -  Agree with use of aspirin and amlodipine.   -  no beta blockers at present secondary to recent cocaine use  -  Add lisinopril 5mg  daily for LV dysfunction and follow renal function closely  -  further workup with [facility] cardiology ? Cath vs nuclear stress test      Quintella Reichert, MD  07/12/2012  4:02 PM

## 2012-07-12 NOTE — Progress Notes (Signed)
Echo Lab  2D Echocardiogram completed.  Kymiah Araiza L Elira Colasanti, RDCS 07/12/2012 8:58 AM

## 2012-07-13 LAB — BASIC METABOLIC PANEL
GFR calc Af Amer: 54 mL/min — ABNORMAL LOW (ref >90)
GFR calc non Af Amer: 46 mL/min — ABNORMAL LOW (ref >90)
Potassium: 4.4 mEq/L (ref 3.5–5.1)
Sodium: 140 mEq/L (ref 135–145)

## 2012-07-13 LAB — CBC
MCHC: 32.9 g/dL (ref 30.0–36.0)
Platelets: 170 10*3/uL (ref 150–400)
RDW: 13.2 % (ref 11.5–15.5)

## 2012-07-13 NOTE — Progress Notes (Signed)
Notified by NT Neysa Bonito that she thought that the patient might be smoking in the room, that it smelled like smoke and that there was a lighter in the bathroom. Entered room and asked patient if he had been smoking and he denied it. Room smelled like smoke. Went into bathroom which reeked of smoke odor. Found lighter on toilet paper roll holder. Asked patient again if he had been smoking. He then admitted to smoking in the room earlier in the day.  Confiscated lighter and notified CN.  Educated patient on dangers to himself, hospital rules on smoking, and that cigarette smoke can affect other patients around him. Patient not very receptive to education as he has been smoking for forty years.  Notified Dr. Lenise Arena by text page. Will continue to monitor. Erskin Burnet RN

## 2012-07-13 NOTE — Progress Notes (Signed)
Patient ID: Carlos Beasley, male   DOB: 30-Aug-1945, 67 y.o.   MRN: [id number] [facility] PROGRESS NOTE  MICIAH COVELLI NFA:[id number] DOB: [birth year] DOA: 07/11/2012 PCP: No primary provider on file.  Brief narrative:  67 y.o. male with past medical history of hypertension-noncompliant with medications, tobacco and cocaine abuse who presented to [facility] ED with main concern of left sided chest pain that started several hours prior to admission, sharp and constant, 7/10 in severity and non radiating with no specific alleviating or aggravating factors. He was given 3 tablets of SL NTG with complete resolution of CP in ED but 12 lead EKG showed T-wave inversion in the inferior lateral leads. Pt reports using cocaine prior to the onset of chest pain. [facility] asked to admit for further evaluation.   Principal Problem:  Chest pain  - likely secondary to cocaine induced vasospasm, now resolved per pt  - 4 sets of CE's with normal troponin, appreciate cardiology involvement  - 2 D ECHO shows moderate to severe reduction in systolic function with ED 35%  - continue to monitor on tele and follow up on cardiology recommendations  - plan for stress test in AM Active Problems:  Tobacco abuse  - cessation discussed in detail  Acute renal failure  - creatinine still elevated and if continues to increase will need to hold Lisinopril, will ask cardiology for opinion  - BMP in AM  HTN (hypertension)  - continue Norvasc and Lisinopril  Cocaine abuse  - cessation discussed in detail   Consultants:  Cardiology Procedures/Studies:  Dg Chest 2 View 07/11/2012 No active cardiopulmonary disease.  Antibiotics:  None  Code Status: Full  Family Communication: Pt at bedside  Disposition Plan: Home when medically stable   HPI/Subjective: No events overnight.   Objective: Filed Vitals:   07/12/12 1520 07/12/12 2100 07/13/12 0418 07/13/12 0912  BP: 157/86 160/77 140/84 140/81  Pulse: 55 58 50   Temp: 98.6 F  (37 C) 97.3 F (36.3 C) 97.9 F (36.6 C)   TempSrc: Oral Oral Oral   Resp: 18 18 18    Height:      Weight:      SpO2: 100% 100% 100%     Intake/Output Summary (Last 24 hours) at 07/13/12 1009 Last data filed at 07/13/12 0700  Gross per 24 hour  Intake    540 ml  Output    500 ml  Net     40 ml    Exam:   General:  Pt is alert, follows commands appropriately, not in acute distress  Cardiovascular: Regular rate and rhythm, S1/S2, no murmurs, no rubs, no gallops  Respiratory: Clear to auscultation bilaterally, no wheezing, no crackles, no rhonchi  Abdomen: Soft, non tender, non distended, bowel sounds present, no guarding  Extremities: No edema, pulses DP and PT palpable bilaterally  Neuro: Grossly nonfocal  Data Reviewed: Basic Metabolic Panel:  Recent Labs Lab 07/11/12 1433 07/11/12 1921 07/12/12 0712 07/13/12 0416  NA 140  --  141 140  K 3.5  --  4.0 4.4  CL 105  --  107 106  CO2 27  --  29 30  GLUCOSE 141*  --  81 94  BUN 15  --  15 15  CREATININE 1.41* 1.51* 1.36* 1.50*  CALCIUM 9.1  --  8.7 8.9   Liver Function Tests:  Recent Labs Lab 07/12/12 0712  AST 12  ALT 8  ALKPHOS 40  BILITOT 0.3  PROT 6.1  ALBUMIN 3.0*  CBC:  Recent Labs Lab 07/11/12 1433 07/11/12 1921 07/12/12 0712 07/13/12 0416  WBC 4.8 5.1 3.6* 4.3  HGB 12.2* 12.1* 12.0* 11.8*  HCT 37.0* 36.5* 37.0* 35.9*  MCV 92.3 92.9 93.0 91.8  PLT 197 166 182 170   Cardiac Enzymes:  Recent Labs Lab 07/11/12 1433 07/11/12 1921 07/12/12 0105 07/12/12 0712  TROPONINI <0.30 <0.30 <0.30 <0.30   Scheduled Meds: . amLODipine  5 mg Oral Daily  . aspirin EC  325 mg Oral Daily  . enoxaparin (LOVENOX) injection  40 mg Subcutaneous Q24H  . lisinopril  5 mg Oral Daily  . sodium chloride  3 mL Intravenous Q12H   Continuous Infusions:    Debbora Presto, MD  [facility] Pager [phone]  If 7PM-7AM, please contact night-coverage [email or web address] Password [email or web address] 07/13/2012, 10:09 AM    LOS: 2 days

## 2012-07-13 NOTE — Progress Notes (Signed)
Patient had 6 beats SVT, asymptomatic.  Dr. Lenise Arena notified. Erskin Burnet RN

## 2012-07-13 NOTE — Progress Notes (Signed)
SUBJECTIVE:  No further CP  OBJECTIVE:   Vitals:   Filed Vitals:   07/12/12 1114 07/12/12 1520 07/12/12 2100 07/13/12 0418  BP: 143/87 157/86 160/77 140/84  Pulse:  55 58 50  Temp:  98.6 F (37 C) 97.3 F (36.3 C) 97.9 F (36.6 C)  TempSrc:  Oral Oral Oral  Resp:  18 18 18   Height:      Weight:      SpO2:  100% 100% 100%   I&O's:   Intake/Output Summary (Last 24 hours) at 07/13/12 0726 Last data filed at 07/12/12 1818  Gross per 24 hour  Intake    680 ml  Output    800 ml  Net   -120 ml   TELEMETRY: Reviewed telemetry pt in NSR:     PHYSICAL EXAM General: Well developed, well nourished, in no acute distress Head: Eyes PERRLA, No xanthomas.   Normal cephalic and atramatic  Lungs:   Clear bilaterally to auscultation and percussion. Heart:   HRRR S1 S2 Pulses are 2+ & equal. Abdomen: Bowel sounds are positive, abdomen soft and non-tender without masses  Extremities:   No clubbing, cyanosis or edema.  DP +1 Neuro: Alert and oriented X 3. Psych:  Good affect, responds appropriately   LABS: Basic Metabolic Panel:  Recent Labs  16/10/96 0712 07/13/12 0416  NA 141 140  K 4.0 4.4  CL 107 106  CO2 29 30  GLUCOSE 81 94  BUN 15 15  CREATININE 1.36* 1.50*  CALCIUM 8.7 8.9   Liver Function Tests:  Recent Labs  07/12/12 0712  AST 12  ALT 8  ALKPHOS 40  BILITOT 0.3  PROT 6.1  ALBUMIN 3.0*   No results found for this basename: LIPASE, AMYLASE,  in the last 72 hours CBC:  Recent Labs  07/12/12 0712 07/13/12 0416  WBC 3.6* 4.3  HGB 12.0* 11.8*  HCT 37.0* 35.9*  MCV 93.0 91.8  PLT 182 170   Cardiac Enzymes:  Recent Labs  07/11/12 1921 07/12/12 0105 07/12/12 0712  TROPONINI <0.30 <0.30 <0.30   BNP: No components found with this basename: POCBNP,  D-Dimer: No results found for this basename: DDIMER,  in the last 72 hours Hemoglobin A1C:  Recent Labs  07/11/12 1921  HGBA1C 5.6   Fasting Lipid Panel:  Recent Labs  07/12/12 0712  CHOL  147  HDL 72  LDLCALC 63  TRIG 61  CHOLHDL 2.0   Thyroid Function Tests: No results found for this basename: TSH, T4TOTAL, FREET3, T3FREE, THYROIDAB,  in the last 72 hours Anemia Panel: No results found for this basename: VITAMINB12, FOLATE, FERRITIN, TIBC, IRON, RETICCTPCT,  in the last 72 hours Coag Panel:   No results found for this basename: INR, PROTIME    RADIOLOGY: Dg Chest 2 View  07/11/2012   *RADIOLOGY REPORT*  Clinical Data: Chest pain.  CHEST - 2 VIEW  Comparison:  None.  Findings:  The heart size and mediastinal contours are within normal limits.  Both lungs are clear.  The visualized skeletal structures are unremarkable.  IMPRESSION: No active cardiopulmonary disease.   Original Report Authenticated By: Myles Rosenthal, M.D.    Assessment/Plan:  1. Chest pain uncertain etiology. Possible coronary spasm in the face of history of recent cocaine abuse. Cardiac enzymes all negative.  2. Hypertension somewhat improved - Lisinopril added yesterday 3. Medical noncompliance  4. Polysubstance abuse with tobacco and cocaine  5. Moderate LV dysfunction EF 30-35% with ? PFO   Plan:  -  Counsel regarding dangers of cocaine use.  - Agree with use of aspirin and amlodipine.  - no beta blockers at present secondary to recent cocaine use  - Add lisinopril 5mg  daily for LV dysfunction and follow renal function closely  - further workup with [facility] cardiology ? Cath vs nuclear stress test  - will make NPO after midnight       Quintella Reichert, MD  07/13/2012  7:26 AM

## 2012-07-14 DIAGNOSIS — I428 Other cardiomyopathies: Secondary | ICD-10-CM

## 2012-07-14 LAB — BASIC METABOLIC PANEL
CO2: 31 mEq/L (ref 19–32)
GFR calc non Af Amer: 48 mL/min — ABNORMAL LOW (ref >90)
Glucose, Bld: 83 mg/dL (ref 70–99)
Potassium: 4.1 mEq/L (ref 3.5–5.1)
Sodium: 140 mEq/L (ref 135–145)

## 2012-07-14 LAB — CBC
Hemoglobin: 12.2 g/dL — ABNORMAL LOW (ref 13.0–17.0)
Platelets: 174 10*3/uL (ref 150–400)
RBC: 4.08 MIL/uL — ABNORMAL LOW (ref 4.22–5.81)

## 2012-07-14 MED ORDER — NITROGLYCERIN 0.4 MG SL SUBL: 0.4000 mg | SUBLINGUAL_TABLET | SUBLINGUAL | Status: DC | PRN

## 2012-07-14 MED ORDER — LISINOPRIL 5 MG PO TABS: 5.0000 mg | ORAL_TABLET | Freq: Every day | ORAL | Status: DC

## 2012-07-14 MED ORDER — HYDROCODONE-ACETAMINOPHEN 5-325 MG PO TABS: 1.0000 | ORAL_TABLET | ORAL | Status: DC | PRN

## 2012-07-14 MED ORDER — AMLODIPINE BESYLATE 5 MG PO TABS: 5.0000 mg | ORAL_TABLET | Freq: Every day | ORAL | Status: DC

## 2012-07-14 MED ORDER — ASPIRIN 325 MG PO TBEC: 325.0000 mg | DELAYED_RELEASE_TABLET | Freq: Every day | ORAL | Status: DC

## 2012-07-14 NOTE — Progress Notes (Signed)
   SUBJECTIVE:  No chest pain.  No SOB.   PHYSICAL EXAM Filed Vitals:   07/13/12 0912 07/13/12 1300 07/13/12 2041 07/14/12 0451  BP: 140/81 126/89 143/76 130/90  Pulse:  59 58 50  Temp:  98.3 F (36.8 C) 98 F (36.7 C) 98.2 F (36.8 C)  TempSrc:  Oral Oral Oral  Resp:  20 20 20   Height:      Weight:      SpO2:  100% 100% 100%   General:  No distress Lungs:  Clear Heart:  RRR Abdomen:  Positive bowel sounds, no rebound no guarding Extremities:  No edema.   LABS: Lab Results  Component Value Date   TROPONINI <0.30 07/12/2012   Results for orders placed during the hospital encounter of 07/11/12 (from the past 24 hour(s))  CBC     Status: Abnormal   Collection Time    07/14/12  4:33 AM      Result Value Range   WBC 3.7 (*) 4.0 - 10.5 K/uL   RBC 4.08 (*) 4.22 - 5.81 MIL/uL   Hemoglobin 12.2 (*) 13.0 - 17.0 g/dL   HCT 47.8 (*) 29.5 - 62.1 %   MCV 91.7  78.0 - 100.0 fL   MCH 29.9  26.0 - 34.0 pg   MCHC 32.6  30.0 - 36.0 g/dL   RDW 30.8  65.7 - 84.6 %   Platelets 174  150 - 400 K/uL  BASIC METABOLIC PANEL     Status: Abnormal   Collection Time    07/14/12  4:33 AM      Result Value Range   Sodium 140  135 - 145 mEq/L   Potassium 4.1  3.5 - 5.1 mEq/L   Chloride 103  96 - 112 mEq/L   CO2 31  19 - 32 mEq/L   Glucose, Bld 83  70 - 99 mg/dL   BUN 16  6 - 23 mg/dL   Creatinine, Ser 9.62 (*) 0.50 - 1.35 mg/dL   Calcium 8.8  8.4 - 95.2 mg/dL   GFR calc non Af Amer 48 (*) >90 mL/min   GFR calc Af Amer 56 (*) >90 mL/min    Intake/Output Summary (Last 24 hours) at 07/14/12 0728 Last data filed at 07/13/12 1700  Gross per 24 hour  Intake    420 ml  Output      0 ml  Net    420 ml    ASSESSMENT AND PLAN:  Chest pain:  No further chest pain.  Ruled. Out.  Plan outpatient exercise Myoview.  He can have this scheduled in our office.     Tobacco abuse:  Smoking this admission in the bathroom.  He has been educated.    Cocaine abuse:  Continue to educate.   Congestive  dilated cardiomyopathy:  Discussed the need for medical management and compliance.    Avoiding beta blocker secondary to cocaine use.     Fayrene Fearing [facility] 07/14/2012 7:28 AM \

## 2012-07-14 NOTE — Discharge Summary (Signed)
Physician Discharge Summary  Carlos Beasley ZOX:[id number] DOB: [birth year] DOA: 07/11/2012  PCP: No primary provider on file.  Admit date: 07/11/2012 Discharge date: 07/14/2012  Recommendations for Outpatient Follow-up:  1. Pt will need to follow up with PCP in 2-3 weeks post discharge 2. Please obtain BMP to evaluate electrolytes and kidney function 3. Pleas note that pt was discharged on Lisinopril due to systolic and diastolic heart failure but close monitoring of renal function is necessary  4. Please also check CBC to evaluate Hg and Hct levels 5. Please note that pt was advised [facility] to establish the care 6. Please also note that pt advised to follow up with cardiologist to set up an outpatient stress test 7. Pt was noted to use cocaine in the room while inpatient  8. Cessation was discussed extensively   Discharge Diagnoses: Cocaine induced vasospasm  Principal Problem:   Chest pain Active Problems:   Tobacco abuse   HTN (hypertension)   Cocaine abuse   Congestive dilated cardiomyopathy  Discharge Condition: Stable  Diet recommendation: Heart healthy diet discussed in details   Brief narrative:  67 y.o. male with past medical history of hypertension-noncompliant with medications, tobacco and cocaine abuse who presented to [facility] ED with main concern of left sided chest pain that started several hours prior to admission, sharp and constant, 7/10 in severity and non radiating with no specific alleviating or aggravating factors. He was given 3 tablets of SL NTG with complete resolution of CP in ED but 12 lead EKG showed T-wave inversion in the inferior lateral leads. Pt reports using cocaine prior to the onset of chest pain. [facility] asked to admit for further evaluation.   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  Antibiotics:  None  Code Status: Full  Family Communication: Pt at bedside   Discharge Exam: Filed Vitals:   07/14/12 0451  BP: 130/90  Pulse: 50  Temp: 98.2 F (36.8 C)  Resp: 20   Filed Vitals:   07/13/12 0912 07/13/12 1300 07/13/12 2041 07/14/12 0451  BP: 140/81 126/89 143/76 130/90  Pulse:  59 58 50  Temp:  98.3 F (36.8 C) 98 F (36.7 C) 98.2 F (36.8 C)  TempSrc:  Oral Oral Oral  Resp:  20 20 20   Height:      Weight:      SpO2:  100% 100% 100%    General: Pt is alert, follows commands appropriately, not in acute distress Cardiovascular: Regular rate and rhythm, S1/S2 +, no murmurs, no rubs, no gallops Respiratory:  Clear to auscultation bilaterally, no wheezing, no crackles, no rhonchi Abdominal: Soft, non tender, non distended, bowel sounds +, no guarding Extremities: no edema, no cyanosis, pulses palpable bilaterally DP and PT Neuro: Grossly nonfocal  Discharge Instructions  Discharge Orders   Future Orders Complete By Expires     Diet - low sodium heart healthy  As directed     Increase activity slowly  As directed         Medication List         amLODipine 5 MG tablet  Commonly  known as:  NORVASC  Take 1 tablet (5 mg total) by mouth daily.     aspirin 325 MG EC tablet  Take 1 tablet (325 mg total) by mouth daily.     HYDROcodone-acetaminophen 5-325 MG per tablet  Commonly known as:  NORCO/VICODIN  Take 1-2 tablets by mouth every 4 (four) hours as needed.     lisinopril 5 MG tablet  Commonly known as:  PRINIVIL,ZESTRIL  Take 1 tablet (5 mg total) by mouth daily.     nitroGLYCERIN 0.4 MG SL tablet  Commonly known as:  NITROSTAT  Place 1 tablet (0.4 mg total) under the tongue every 5 (five) minutes as needed for chest pain.           Follow-up Information   Schedule an appointment as soon as possible for a visit with Standley Dakins, MD.   Contact information:   [address] [place] [address] [phone]        The results of significant diagnostics from this hospitalization (including imaging, microbiology, ancillary and laboratory) are listed below for reference.     Microbiology: No results found for this or any previous visit (from the past 240 hour(s)).   Labs: Basic Metabolic Panel:  Recent Labs Lab 07/11/12 1433 07/11/12 1921 07/12/12 0712 07/13/12 0416 07/14/12 0433  NA 140  --  141 140 140  K 3.5  --  4.0 4.4 4.1  CL 105  --  107 106 103  CO2 27  --  29 30 31   GLUCOSE 141*  --  81 94 83  BUN 15  --  15 15 16   CREATININE 1.41* 1.51* 1.36* 1.50* 1.46*  CALCIUM 9.1  --  8.7 8.9 8.8   Liver Function Tests:  Recent Labs Lab 07/12/12 0712  AST 12  ALT 8  ALKPHOS 40  BILITOT 0.3  PROT 6.1  ALBUMIN 3.0*   CBC:  Recent Labs Lab 07/11/12 1433 07/11/12 1921 07/12/12 0712 07/13/12 0416 07/14/12 0433  WBC 4.8 5.1 3.6* 4.3 3.7*  HGB 12.2* 12.1* 12.0* 11.8* 12.2*  HCT 37.0* 36.5* 37.0* 35.9* 37.4*  MCV 92.3 92.9 93.0 91.8 91.7  PLT 197 166 182 170 174   Cardiac Enzymes:  Recent Labs Lab 07/11/12 1433 07/11/12 1921 07/12/12 0105 07/12/12 0712  TROPONINI <0.30 <0.30 <0.30 <0.30   SIGNED: Time  coordinating discharge: Over 30 minutes  Debbora Presto, MD  [facility] 07/14/2012, 10:01 AM Pager [phone]  If 7PM-7AM, please contact night-coverage [email or web address] Password [email or web address]

## 2013-01-02 ENCOUNTER — Emergency Department (HOSPITAL_COMMUNITY): Payer: Medicare Other

## 2013-01-02 ENCOUNTER — Encounter (HOSPITAL_COMMUNITY): Payer: Self-pay | Admitting: Emergency Medicine

## 2013-01-02 ENCOUNTER — Inpatient Hospital Stay (HOSPITAL_COMMUNITY)
Admission: EM | Admit: 2013-01-02 | Discharge: 2013-01-09 | DRG: 040 | Disposition: A | Discharge status: Home / Self Care | Payer: Medicare Other | Attending: Internal Medicine | Admitting: Internal Medicine

## 2013-01-02 DIAGNOSIS — I5040 Unspecified combined systolic (congestive) and diastolic (congestive) heart failure: Secondary | ICD-10-CM | POA: Diagnosis present

## 2013-01-02 DIAGNOSIS — I6350 Cerebral infarction due to unspecified occlusion or stenosis of unspecified cerebral artery: Secondary | ICD-10-CM | POA: Diagnosis present

## 2013-01-02 DIAGNOSIS — I6789 Other cerebrovascular disease: Secondary | ICD-10-CM

## 2013-01-02 DIAGNOSIS — R590 Localized enlarged lymph nodes: Secondary | ICD-10-CM

## 2013-01-02 DIAGNOSIS — I428 Other cardiomyopathies: Secondary | ICD-10-CM | POA: Diagnosis present

## 2013-01-02 DIAGNOSIS — N183 Chronic kidney disease, stage 3 unspecified: Secondary | ICD-10-CM | POA: Diagnosis present

## 2013-01-02 DIAGNOSIS — I6529 Occlusion and stenosis of unspecified carotid artery: Secondary | ICD-10-CM | POA: Diagnosis present

## 2013-01-02 DIAGNOSIS — C7949 Secondary malignant neoplasm of other parts of nervous system: Principal | ICD-10-CM

## 2013-01-02 DIAGNOSIS — G9389 Other specified disorders of brain: Secondary | ICD-10-CM

## 2013-01-02 DIAGNOSIS — R591 Generalized enlarged lymph nodes: Secondary | ICD-10-CM

## 2013-01-02 DIAGNOSIS — F17200 Nicotine dependence, unspecified, uncomplicated: Secondary | ICD-10-CM | POA: Diagnosis present

## 2013-01-02 DIAGNOSIS — I10 Essential (primary) hypertension: Secondary | ICD-10-CM

## 2013-01-02 DIAGNOSIS — G8190 Hemiplegia, unspecified affecting unspecified side: Secondary | ICD-10-CM | POA: Diagnosis present

## 2013-01-02 DIAGNOSIS — R599 Enlarged lymph nodes, unspecified: Secondary | ICD-10-CM | POA: Diagnosis present

## 2013-01-02 DIAGNOSIS — R079 Chest pain, unspecified: Secondary | ICD-10-CM

## 2013-01-02 DIAGNOSIS — Z720 Tobacco use: Secondary | ICD-10-CM

## 2013-01-02 DIAGNOSIS — I420 Dilated cardiomyopathy: Secondary | ICD-10-CM

## 2013-01-02 DIAGNOSIS — I509 Heart failure, unspecified: Secondary | ICD-10-CM | POA: Diagnosis present

## 2013-01-02 DIAGNOSIS — R911 Solitary pulmonary nodule: Secondary | ICD-10-CM

## 2013-01-02 DIAGNOSIS — I129 Hypertensive chronic kidney disease with stage 1 through stage 4 chronic kidney disease, or unspecified chronic kidney disease: Secondary | ICD-10-CM | POA: Diagnosis present

## 2013-01-02 DIAGNOSIS — G936 Cerebral edema: Secondary | ICD-10-CM | POA: Diagnosis present

## 2013-01-02 DIAGNOSIS — C3491 Malignant neoplasm of unspecified part of right bronchus or lung: Secondary | ICD-10-CM

## 2013-01-02 DIAGNOSIS — F1410 Cocaine abuse, uncomplicated: Secondary | ICD-10-CM

## 2013-01-02 DIAGNOSIS — G40909 Epilepsy, unspecified, not intractable, without status epilepticus: Secondary | ICD-10-CM

## 2013-01-02 DIAGNOSIS — C7931 Secondary malignant neoplasm of brain: Principal | ICD-10-CM | POA: Diagnosis present

## 2013-01-02 DIAGNOSIS — C3490 Malignant neoplasm of unspecified part of unspecified bronchus or lung: Secondary | ICD-10-CM

## 2013-01-02 DIAGNOSIS — R569 Unspecified convulsions: Secondary | ICD-10-CM

## 2013-01-02 DIAGNOSIS — G9349 Other encephalopathy: Secondary | ICD-10-CM | POA: Diagnosis present

## 2013-01-02 DIAGNOSIS — I639 Cerebral infarction, unspecified: Secondary | ICD-10-CM | POA: Diagnosis present

## 2013-01-02 DIAGNOSIS — R918 Other nonspecific abnormal finding of lung field: Secondary | ICD-10-CM

## 2013-01-02 HISTORY — DX: Essential (primary) hypertension: I10

## 2013-01-02 LAB — CBC
HCT: 36.2 % — ABNORMAL LOW (ref 39.0–52.0)
HEMATOCRIT: 41.4 % (ref 39.0–52.0)
Hemoglobin: 13.5 g/dL (ref 13.0–17.0)
Hemoglobin: 11.9 g/dL — ABNORMAL LOW (ref 13.0–17.0)
MCH: 29.5 pg (ref 26.0–34.0)
MCH: 30 pg (ref 26.0–34.0)
MCHC: 32.6 g/dL (ref 30.0–36.0)
MCHC: 32.9 g/dL (ref 30.0–36.0)
MCV: 90.6 fL (ref 78.0–100.0)
MCV: 91.2 fL (ref 78.0–100.0)
PLATELETS: 238 10*3/uL (ref 150–400)
PLATELETS: 225 10*3/uL (ref 150–400)
RBC: 4.57 MIL/uL (ref 4.22–5.81)
RBC: 3.97 MIL/uL — AB (ref 4.22–5.81)
RDW: 13.7 % (ref 11.5–15.5)
RDW: 13.9 % (ref 11.5–15.5)
WBC: 4.9 10*3/uL (ref 4.0–10.5)
WBC: 5 10*3/uL (ref 4.0–10.5)

## 2013-01-02 LAB — MAGNESIUM: Magnesium: 2.2 mg/dL (ref 1.5–2.5)

## 2013-01-02 LAB — MRSA PCR SCREENING: MRSA BY PCR: NEGATIVE

## 2013-01-02 LAB — DIFFERENTIAL
Basophils Absolute: 0 10*3/uL (ref 0.0–0.1)
Basophils Relative: 0 % (ref 0–1)
EOS ABS: 0 10*3/uL (ref 0.0–0.7)
EOS PCT: 1 % (ref 0–5)
LYMPHS ABS: 2.3 10*3/uL (ref 0.7–4.0)
LYMPHS PCT: 46 % (ref 12–46)
MONO ABS: 0.4 10*3/uL (ref 0.1–1.0)
Monocytes Relative: 8 % (ref 3–12)
Neutro Abs: 2.2 10*3/uL (ref 1.7–7.7)
Neutrophils Relative %: 45 % (ref 43–77)

## 2013-01-02 LAB — POCT I-STAT, CHEM 8
BUN: 22 mg/dL (ref 6–23)
Calcium, Ion: 1.17 mmol/L (ref 1.13–1.30)
Chloride: 105 mEq/L (ref 96–112)
Creatinine, Ser: 1.8 mg/dL — ABNORMAL HIGH (ref 0.50–1.35)
Glucose, Bld: 127 mg/dL — ABNORMAL HIGH (ref 70–99)
HCT: 42 % (ref 39.0–52.0)
HEMOGLOBIN: 14.3 g/dL (ref 13.0–17.0)
Potassium: 4.6 mEq/L (ref 3.7–5.3)
SODIUM: 143 meq/L (ref 137–147)
TCO2: 27 mmol/L (ref 0–100)

## 2013-01-02 LAB — COMPREHENSIVE METABOLIC PANEL
ALT: 11 U/L (ref 0–53)
AST: 19 U/L (ref 0–37)
Albumin: 3.7 g/dL (ref 3.5–5.2)
Alkaline Phosphatase: 50 U/L (ref 39–117)
BUN: 19 mg/dL (ref 6–23)
CALCIUM: 9.3 mg/dL (ref 8.4–10.5)
CO2: 24 meq/L (ref 19–32)
CREATININE: 1.65 mg/dL — AB (ref 0.50–1.35)
Chloride: 103 mEq/L (ref 96–112)
GFR, EST AFRICAN AMERICAN: 48 mL/min — AB (ref >90)
GFR, EST NON AFRICAN AMERICAN: 41 mL/min — AB (ref >90)
GLUCOSE: 127 mg/dL — AB (ref 70–99)
Potassium: 4.8 mEq/L (ref 3.7–5.3)
SODIUM: 143 meq/L (ref 137–147)
TOTAL PROTEIN: 8.4 g/dL — AB (ref 6.0–8.3)
Total Bilirubin: 0.4 mg/dL (ref 0.3–1.2)

## 2013-01-02 LAB — RAPID URINE DRUG SCREEN, HOSP PERFORMED
Amphetamines: NOT DETECTED
BENZODIAZEPINES: NOT DETECTED
Barbiturates: NOT DETECTED
COCAINE: POSITIVE — AB
OPIATES: NOT DETECTED
Tetrahydrocannabinol: NOT DETECTED

## 2013-01-02 LAB — URINE MICROSCOPIC-ADD ON

## 2013-01-02 LAB — HEPATIC FUNCTION PANEL
ALBUMIN: 3.4 g/dL — AB (ref 3.5–5.2)
ALT: 10 U/L (ref 0–53)
AST: 15 U/L (ref 0–37)
Alkaline Phosphatase: 45 U/L (ref 39–117)
Bilirubin, Direct: <0.2 mg/dL (ref 0.0–0.3)
TOTAL PROTEIN: 7.4 g/dL (ref 6.0–8.3)
Total Bilirubin: 0.3 mg/dL (ref 0.3–1.2)

## 2013-01-02 LAB — LIPID PANEL
Cholesterol: 185 mg/dL (ref 0–200)
HDL: 74 mg/dL (ref >39)
LDL Cholesterol: 95 mg/dL (ref 0–99)
TRIGLYCERIDES: 82 mg/dL (ref <150)
Total CHOL/HDL Ratio: 2.5 RATIO
VLDL: 16 mg/dL (ref 0–40)

## 2013-01-02 LAB — POCT I-STAT TROPONIN I: TROPONIN I, POC: 0.02 ng/mL (ref 0.00–0.08)

## 2013-01-02 LAB — URINALYSIS, ROUTINE W REFLEX MICROSCOPIC
BILIRUBIN URINE: NEGATIVE
GLUCOSE, UA: NEGATIVE mg/dL
HGB URINE DIPSTICK: NEGATIVE
KETONES UR: 15 mg/dL — AB
Leukocytes, UA: NEGATIVE
NITRITE: NEGATIVE
PH: 6.5 (ref 5.0–8.0)
Protein, ur: 30 mg/dL — AB
SPECIFIC GRAVITY, URINE: 1.027 (ref 1.005–1.030)
Urobilinogen, UA: 1 mg/dL (ref 0.0–1.0)

## 2013-01-02 LAB — GLUCOSE, CAPILLARY: Glucose-Capillary: 112 mg/dL — ABNORMAL HIGH (ref 70–99)

## 2013-01-02 LAB — PROTIME-INR
INR: 1.01 (ref 0.00–1.49)
INR: 1.02 (ref 0.00–1.49)
PROTHROMBIN TIME: 13.1 s (ref 11.6–15.2)
Prothrombin Time: 13.2 seconds (ref 11.6–15.2)

## 2013-01-02 LAB — TROPONIN I: Troponin I: <0.3 ng/mL (ref <0.30)

## 2013-01-02 LAB — APTT: aPTT: 23 seconds — ABNORMAL LOW (ref 24–37)

## 2013-01-02 LAB — TSH: TSH: 0.896 u[IU]/mL (ref 0.350–4.500)

## 2013-01-02 LAB — HEMOGLOBIN A1C
Hgb A1c MFr Bld: 6.1 % — ABNORMAL HIGH (ref <5.7)
Mean Plasma Glucose: 128 mg/dL — ABNORMAL HIGH (ref <117)

## 2013-01-02 LAB — ETHANOL

## 2013-01-02 MED ORDER — ONDANSETRON HCL 4 MG/2ML IJ SOLN: 4.0000 mg | Freq: Four times a day (QID) | INTRAMUSCULAR | Status: DC | PRN

## 2013-01-02 MED ORDER — LORAZEPAM 2 MG/ML IJ SOLN
INTRAMUSCULAR | Status: AC
  Administered 2013-01-02: 2 mg
  Filled 2013-01-02: qty 1

## 2013-01-02 MED ORDER — DEXAMETHASONE SODIUM PHOSPHATE 10 MG/ML IJ SOLN
10.0000 mg | Freq: Once | INTRAMUSCULAR | Status: AC
  Administered 2013-01-02: 10 mg via INTRAVENOUS
  Filled 2013-01-02: qty 1

## 2013-01-02 MED ORDER — SODIUM CHLORIDE 0.9 % IV SOLN
INTRAVENOUS | Status: DC
  Administered 2013-01-03: 22:00:00 via INTRAVENOUS
  Administered 2013-01-04: 50 mL/h via INTRAVENOUS
  Administered 2013-01-07: 10 mL/h via INTRAVENOUS

## 2013-01-02 MED ORDER — ASPIRIN EC 325 MG PO TBEC
325.0000 mg | DELAYED_RELEASE_TABLET | Freq: Every day | ORAL | Status: DC
  Administered 2013-01-04 – 2013-01-09 (×5): 325 mg via ORAL
  Filled 2013-01-02 (×6): qty 1

## 2013-01-02 MED ORDER — ENOXAPARIN SODIUM 30 MG/0.3ML ~~LOC~~ SOLN
30.0000 mg | SUBCUTANEOUS | Status: DC
  Filled 2013-01-02 (×2): qty 0.3

## 2013-01-02 MED ORDER — ASPIRIN 300 MG RE SUPP
300.0000 mg | Freq: Every day | RECTAL | Status: DC
  Administered 2013-01-03: 300 mg via RECTAL
  Filled 2013-01-02 (×5): qty 1

## 2013-01-02 MED ORDER — HYDRALAZINE HCL 20 MG/ML IJ SOLN: 10.0000 mg | Freq: Four times a day (QID) | INTRAMUSCULAR | Status: DC | PRN

## 2013-01-02 MED ORDER — SODIUM CHLORIDE 0.9 % IV SOLN
1000.0000 mg | INTRAVENOUS | Status: AC
  Administered 2013-01-02: 1000 mg via INTRAVENOUS
  Filled 2013-01-02: qty 10

## 2013-01-02 MED ORDER — LORAZEPAM 2 MG/ML IJ SOLN: 2.0000 mg | Freq: Four times a day (QID) | INTRAMUSCULAR | Status: DC | PRN

## 2013-01-02 MED ORDER — ACETAMINOPHEN 650 MG RE SUPP: 650.0000 mg | Freq: Four times a day (QID) | RECTAL | Status: DC | PRN

## 2013-01-02 MED ORDER — SODIUM CHLORIDE 0.9 % IV SOLN
500.0000 mg | Freq: Two times a day (BID) | INTRAVENOUS | Status: DC
  Administered 2013-01-03: 500 mg via INTRAVENOUS
  Filled 2013-01-02 (×4): qty 5

## 2013-01-02 MED ORDER — LORAZEPAM BOLUS VIA INFUSION
1.0000 mg | INTRAVENOUS | Status: AC
  Filled 2013-01-02: qty 1

## 2013-01-02 MED ORDER — ACETAMINOPHEN 325 MG PO TABS: 650.0000 mg | ORAL_TABLET | Freq: Four times a day (QID) | ORAL | Status: DC | PRN

## 2013-01-02 MED ORDER — ONDANSETRON HCL 4 MG PO TABS: 4.0000 mg | ORAL_TABLET | Freq: Four times a day (QID) | ORAL | Status: DC | PRN

## 2013-01-02 MED ORDER — LORAZEPAM BOLUS VIA INFUSION: 2.0000 mg | Freq: Four times a day (QID) | INTRAVENOUS | Status: DC | PRN

## 2013-01-02 NOTE — Progress Notes (Signed)
UR completed 

## 2013-01-02 NOTE — H&P (Signed)
[facility] History and Physical  Carlos Beasley ZOX:[id number] DOB: 05-27-1945 DOA: 01/02/2013  Referring physician: EDB PCP: No primary provider on file.   Chief Complaint: Seizure  HPI:  68 year old male, presents with a chief complaint of weakness on the right side. The patient upon his arrival to the ER was found to have tonic-clonic movements in the right arm and the right leg. He was last seen on about 2 hours prior to arrival. Patient was noted to be fairly hypertensive with systolic blood pressure greater than 200. Code stroke was called and patient received Ativan and Keppra. He received a total of 4 mg of Ativan, thousand milligrams Keppra loading dose. He was seen by neurology the ER who recommended a full workup  Initial CT scan showed a large area of edema in the left parietal lobe  Please note that the patient has a history of hypertension, noncompliance with medications, cocaine abuse. After his last discharge he was supposed to be seen at the [facility] . I do not see that this ever transpired.    Review of Systems: negative for the following  Constitutional: As in history of present illness HEENT: Denies photophobia, eye pain, redness, hearing loss, ear pain, congestion, sore throat, rhinorrhea, sneezing, mouth sores, trouble swallowing, neck pain, neck stiffness and tinnitus.  Respiratory: Denies SOB, DOE, cough, chest tightness, and wheezing.  Cardiovascular: Denies chest pain, palpitations and leg swelling.  Gastrointestinal: Denies nausea, vomiting, abdominal pain, diarrhea, constipation, blood in stool and abdominal distention.  Genitourinary: Denies dysuria, urgency, frequency, hematuria, flank pain and difficulty urinating.  Musculoskeletal: Denies myalgias, back pain, joint swelling, arthralgias and gait problem.  Skin: Denies pallor, rash and wound.  Neurological: As in history of present illness light-headedness, numbness and headaches.   Hematological: Denies adenopathy. Easy bruising, personal or family bleeding history  Psychiatric/Behavioral: Denies suicidal ideation, mood changes, confusion, nervousness, sleep disturbance and agitation       Past Medical History  Diagnosis Date  . Medical history non-contributory   . Hypertension      Past Surgical History  Procedure Laterality Date  . No past surgeries        Social History:  reports that he has been smoking Cigarettes.  He has a 12.5 pack-year smoking history. He has never used smokeless tobacco. He reports that he drinks alcohol. He reports that he uses illicit drugs (Cocaine).      No Known Allergies  Family History  Problem Relation Age of Onset  . Heart Problems Mother      Prior to Admission medications   Medication Sig Start Date End Date Taking? Authorizing Provider  amLODipine (NORVASC) 5 MG tablet Take 1 tablet (5 mg total) by mouth daily. 07/14/12   Theodis Blaze, MD  aspirin EC 325 MG EC tablet Take 1 tablet (325 mg total) by mouth daily. 07/14/12   Theodis Blaze, MD  HYDROcodone-acetaminophen (NORCO/VICODIN) 5-325 MG per tablet Take 1-2 tablets by mouth every 4 (four) hours as needed. 07/14/12   Theodis Blaze, MD  lisinopril (PRINIVIL,ZESTRIL) 5 MG tablet Take 1 tablet (5 mg total) by mouth daily. 07/14/12   Theodis Blaze, MD  nitroGLYCERIN (NITROSTAT) 0.4 MG SL tablet Place 1 tablet (0.4 mg total) under the tongue every 5 (five) minutes as needed for chest pain. 07/14/12   Theodis Blaze, MD     Physical Exam: Filed Vitals:   01/02/13 1449  BP: 209/125  Pulse: 70  Temp: 99.2 F (37.3 C)  TempSrc: Oral  Resp: 18  SpO2: 100%     Constitutional: Vital signs reviewed. Patient is a well-developed and well-nourished in no acute distress and cooperative with exam. Alert and oriented x3.  Head: Normocephalic and atraumatic  Ear: TM normal bilaterally  Mouth: no erythema or exudates, MMM  Eyes: PERRL, EOMI, conjunctivae normal, No  scleral icterus.  Neck: Supple, Trachea midline normal ROM, No JVD, mass, thyromegaly, or carotid bruit present.  Cardiovascular: RRR, S1 normal, S2 normal, no MRG, pulses symmetric and intact bilaterally  Pulmonary/Chest: CTAB, no wheezes, rales, or rhonchi  Abdominal: Soft. Non-tender, non-distended, bowel sounds are normal, no masses, organomegaly, or guarding present.  GU: no CVA tenderness Musculoskeletal: No joint deformities, erythema, or stiffness, ROM full and no nontender Ext: no edema and no cyanosis, pulses palpable bilaterally (DP and PT)  Hematology: no cervical, inginal, or axillary adenopathy.  Neurological: Obtunded at the time of this interview,  Skin: Warm, dry and intact. No rash, cyanosis, or clubbing.  Psychiatric: Normal mood and affect. speech and behavior is normal. Judgment and thought content normal. Cognition and memory are normal.       Labs on Admission:    Basic Metabolic Panel:  Recent Labs Lab 01/02/13 1410 01/02/13 1420  NA 143 143  K 4.8 4.6  CL 103 105  CO2 24  --   GLUCOSE 127* 127*  BUN 19 22  CREATININE 1.65* 1.80*  CALCIUM 9.3  --    Liver Function Tests:  Recent Labs Lab 01/02/13 1410  AST 19  ALT 11  ALKPHOS 50  BILITOT 0.4  PROT 8.4*  ALBUMIN 3.7   No results found for this basename: LIPASE, AMYLASE,  in the last 168 hours No results found for this basename: AMMONIA,  in the last 168 hours CBC:  Recent Labs Lab 01/02/13 1410 01/02/13 1420  WBC 4.9  --   NEUTROABS 2.2  --   HGB 13.5 14.3  HCT 41.4 42.0  MCV 90.6  --   PLT 238  --    Cardiac Enzymes:  Recent Labs Lab 01/02/13 1410  TROPONINI <0.30    BNP (last 3 results) No results found for this basename: PROBNP,  in the last 8760 hours    CBG:  Recent Labs Lab 01/02/13 1445  GLUCAP 112*    Radiological Exams on Admission: Ct Head Wo Contrast  01/02/2013   CLINICAL DATA:  Right-sided weakness.  EXAM: CT HEAD WITHOUT CONTRAST  TECHNIQUE:  Contiguous axial images were obtained from the base of the skull through the vertex without intravenous contrast.  COMPARISON:  None.  FINDINGS: Sinuses/Soft tissues: Clear paranasal sinuses and mastoid air cells.  Intracranial: Large area of left parietal hypoattenuation and mass effect. This causes partial effacement of the occipital horn of the left lateral ventricle. 1-2 mm of left-to-right midline shift.  No complicating hemorrhage.  Basal cisterns patent.  Apparent hypoattenuation in the left cerebellar hemisphere on image 6/series 2 is favored to be a artifactual.  No hydrocephalus, intra-axial, or extra-axial fluid collection.  IMPRESSION: 1. Large area of edema within the left parietal lobe. Given the clinical history, this most likely represents a subacute infarct. Given the appearance, differential consideration includes a mass lesion such as a metastasis. There is mass effect and minimal midline shift. Critical test results telephoned to.Dr. Regenia Skeeter. at the time of interpretation at 2:30 p.m.on 01/02/2013. 2. Probable artifactual hypoattenuation in the left cerebellar hemisphere. If the patient undergoes MRI, recommend attention to this area.  Electronically Signed   By: Abigail Miyamoto M.D.   On: 01/02/2013 14:31    EKG: Independently reviewed.EKG Interpretation  Date/Time: Friday January 02 2013 15:07:04 EST  Ventricular Rate: 67  PR Interval: 147  QRS Duration: 92  QT Interval: 416  QTC Calculation: 439  R Axis: 82  Text Interpretation: Sinus rhythm Probable left atrial enlargement Borderline right   Assessment/Plan Active Problems:   Stroke   Seizure cerebral   Altered mental status, possible CVA, simple partial seizure activity versus mass Seen by neurology Continue Keppra When necessary Ativan ICU admission 2-D echo, carotid Doppler Neurochecks   Hypertensive urgency We'll use hydralazine Avoid beta blocker because of history of cocaine abuse Patient is on Norvasc and  lisinopril    Chronic kidney disease stage III Creatinine mildly elevated, baseline of 1.6 Restart ACE inhibitor when able to take by mouth   History of dilated cardiomyopathy Systolic and diastolic CHF, 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

## 2013-01-02 NOTE — ED Notes (Signed)
Verified with family, no wallet or valuables with pt at this time. Family confirmed.

## 2013-01-02 NOTE — Code Documentation (Signed)
68 year old male presents to [facility] via [facility] as code stroke.  Per report patient was LSW at 1200 - then was found leaning against a wall with right sided weakness.  EMS reports active right side seizure on their arrival.  On arrival to ED patient was alert - having right side tonic clonic type movements  = he is alert and able to answer questions and follow some commands.  NIHSS 6 - see note for details.  CT scan report per Dr. Aram Beecham ? Acute infarct vs mass with an edema pattern.    Patient with worsening seizure activity - given total 4 mg Ativan IV - then loaded with 1000 mg Keppra.  Also given 10 mg Decadron per MD order.  Seizure activity controlled - patient sleepy but arouses - maintaining airway and O2 sats - right arm/leg weakness remains - has tone.  For MRI with contrast.  Handoff to [employer].

## 2013-01-02 NOTE — Consult Note (Signed)
NEURO HOSPITALIST CONSULT NOTE    Reason for Consult: Seizure  LSN 1200 on 01/02/13.   tPA was not given due to seizure activity and CT findings concerning for mass with edema NIHSS 6  HPI:                                                                                                                                          Carlos Beasley is an 68 y.o. male who was last seen at 1200 by family .  One hour later they came back to find him on the floor with right sided weakness. EMS was called and patient was found with active right arm and leg partial seizure activity.  He was alert and oriented able to follow commands with left arm and leg.   Code stroke was called and patient was brought to [facility] ED as a code stroke. On arrival patient was alert and oriented with rhythmic right arm and leg jerking consist ant with simple partial seizure.  Ativan was given and seizure activity ceased. After 2 minutes seizure activity again started and 2 more mg Ativan was administered with cessation of seizure activity.  1000 mg Keppra was loaded and additional 500 mg IV BID was ordered.   CT head was obtained showing large left parietal region of edema with differential being stroke versus mass.   Past Medical History  Diagnosis Date  . Medical history non-contributory     Past Surgical History  Procedure Laterality Date  . No past surgeries      Family History  Problem Relation Age of Onset  . Heart Problems Mother     Social History:  reports that he has been smoking Cigarettes.  He has a 12.5 pack-year smoking history. He has never used smokeless tobacco. He reports that he drinks alcohol. He reports that he uses illicit drugs (Cocaine).  No Known Allergies  MEDICATIONS:                                                                                                                     Current Facility-Administered Medications  Medication Dose Route Frequency Provider Last  Rate Last Dose  . levETIRAcetam (KEPPRA) 1,000 mg in sodium chloride 0.9 % 100 mL IVPB  1,000 mg Intravenous  STAT Marliss Coots, PA-C      . levETIRAcetam (KEPPRA) 500 mg in sodium chloride 0.9 % 100 mL IVPB  500 mg Intravenous Q12H Marliss Coots, PA-C      . LORazepam (ATIVAN) bolus via infusion 1 mg  1 mg Intravenous STAT Marliss Coots, PA-C       Current Outpatient Prescriptions  Medication Sig Dispense Refill  . amLODipine (NORVASC) 5 MG tablet Take 1 tablet (5 mg total) by mouth daily.  30 tablet  1  . aspirin EC 325 MG EC tablet Take 1 tablet (325 mg total) by mouth daily.  30 tablet  1  . HYDROcodone-acetaminophen (NORCO/VICODIN) 5-325 MG per tablet Take 1-2 tablets by mouth every 4 (four) hours as needed.  30 tablet  0  . lisinopril (PRINIVIL,ZESTRIL) 5 MG tablet Take 1 tablet (5 mg total) by mouth daily.  30 tablet  1  . nitroGLYCERIN (NITROSTAT) 0.4 MG SL tablet Place 1 tablet (0.4 mg total) under the tongue every 5 (five) minutes as needed for chest pain.  30 tablet  12      ROS:                                                                                                                                       History obtained from the patient  General ROS: negative for - chills, fatigue, fever, night sweats, weight gain or weight loss Psychological ROS: negative for - behavioral disorder, hallucinations, memory difficulties, mood swings or suicidal ideation Ophthalmic ROS: negative for - blurry vision, double vision, eye pain or loss of vision ENT ROS: negative for - epistaxis, nasal discharge, oral lesions, sore throat, tinnitus or vertigo Allergy and Immunology ROS: negative for - hives or itchy/watery eyes Hematological and Lymphatic ROS: negative for - bleeding problems, bruising or swollen lymph nodes Endocrine ROS: negative for - galactorrhea, hair pattern changes, polydipsia/polyuria or temperature intolerance Respiratory ROS: negative for - cough, hemoptysis, shortness  of breath or wheezing Cardiovascular ROS: negative for - chest pain, dyspnea on exertion, edema or irregular heartbeat Gastrointestinal ROS: negative for - abdominal pain, diarrhea, hematemesis, nausea/vomiting or stool incontinence Genito-Urinary ROS: negative for - dysuria, hematuria, incontinence or urinary frequency/urgency Musculoskeletal ROS: negative for - joint swelling or muscular weakness Neurological ROS: as noted in HPI Dermatological ROS: negative for rash and skin lesion changes   Blood pressure 209/125, pulse 70, temperature 99.2 F (37.3 C), temperature source Oral, resp. rate 18, SpO2 100.00%.   Neurologic Examination:  Mental Status: Alert, oriented, thought content appropriate.  Speech fluent without evidence of aphasia.  Able to follow 3 step commands without difficulty. Cranial Nerves: II: Discs flat bilaterally; Visual fields grossly normal, pupils equal, round, reactive to light and accommodation III,IV, VI: ptosis not present, extra-ocular motions intact bilaterally V,VII: smile symmetric, facial light touch sensation normal bilaterally VIII: hearing normal bilaterally IX,X: gag reflex present XI: bilateral shoulder shrug XII: midline tongue extension without atrophy or fasciculations  Motor: Right : Upper extremity   3/5    Left:     Upper extremity   5/5  Lower extremity   0/5     Lower extremity   5/5 --initially showing right arm and leg seizure activity.  Tone and bulk:normal tone throughout; no atrophy noted Sensory: Pinprick and light touch intact throughout, bilaterally Deep Tendon Reflexes:  Right: Upper Extremity   Left: Upper extremity   biceps (C-5 to C-6) 2/4   biceps (C-5 to C-6) 2/4 tricep (C7) 2/4    triceps (C7) 2/4 Brachioradialis (C6) 2/4  Brachioradialis (C6) 2/4  Lower Extremity Lower Extremity  quadriceps (L-2 to L-4) 1/4   quadriceps  (L-2 to L-4) 1/4 Achilles (S1) 1/4   Achilles (S1) 1/4  Plantars: Mute bilaterally Cerebellar: normal finger-to-nose on the left,  normal heel-to-shin test on the left Gait: not able to test due to seizure activity CV: pulses palpable throughout    Lab Results  Component Value Date/Time   CHOL 147 07/12/2012  7:12 AM    Results for orders placed during the hospital encounter of 01/02/13 (from the past 48 hour(s))  PROTIME-INR     Status: None   Collection Time    01/02/13  2:10 PM      Result Value Range   Prothrombin Time 13.1  11.6 - 15.2 seconds   INR 1.01  0.00 - 1.49  APTT     Status: Abnormal   Collection Time    01/02/13  2:10 PM      Result Value Range   aPTT 23 (*) 24 - 37 seconds  CBC     Status: None   Collection Time    01/02/13  2:10 PM      Result Value Range   WBC 4.9  4.0 - 10.5 K/uL   RBC 4.57  4.22 - 5.81 MIL/uL   Hemoglobin 13.5  13.0 - 17.0 g/dL   HCT 41.4  39.0 - 52.0 %   MCV 90.6  78.0 - 100.0 fL   MCH 29.5  26.0 - 34.0 pg   MCHC 32.6  30.0 - 36.0 g/dL   RDW 13.7  11.5 - 15.5 %   Platelets 238  150 - 400 K/uL  DIFFERENTIAL     Status: None   Collection Time    01/02/13  2:10 PM      Result Value Range   Neutrophils Relative % 45  43 - 77 %   Neutro Abs 2.2  1.7 - 7.7 K/uL   Lymphocytes Relative 46  12 - 46 %   Lymphs Abs 2.3  0.7 - 4.0 K/uL   Monocytes Relative 8  3 - 12 %   Monocytes Absolute 0.4  0.1 - 1.0 K/uL   Eosinophils Relative 1  0 - 5 %   Eosinophils Absolute 0.0  0.0 - 0.7 K/uL   Basophils Relative 0  0 - 1 %   Basophils Absolute 0.0  0.0 - 0.1 K/uL  POCT I-STAT TROPONIN I  Status: None   Collection Time    01/02/13  2:19 PM      Result Value Range   Troponin i, poc 0.02  0.00 - 0.08 ng/mL   Comment 3            Comment: Due to the release kinetics of cTnI,     a negative result within the first hours     of the onset of symptoms does not rule out     myocardial infarction with certainty.     If myocardial infarction  is still suspected,     repeat the test at appropriate intervals.  POCT I-STAT, CHEM 8     Status: Abnormal   Collection Time    01/02/13  2:20 PM      Result Value Range   Sodium 143  137 - 147 mEq/L   Potassium 4.6  3.7 - 5.3 mEq/L   Chloride 105  96 - 112 mEq/L   BUN 22  6 - 23 mg/dL   Creatinine, Ser 1.80 (*) 0.50 - 1.35 mg/dL   Glucose, Bld 127 (*) 70 - 99 mg/dL   Calcium, Ion 1.17  1.13 - 1.30 mmol/L   TCO2 27  0 - 100 mmol/L   Hemoglobin 14.3  13.0 - 17.0 g/dL   HCT 42.0  39.0 - 52.0 %  GLUCOSE, CAPILLARY     Status: Abnormal   Collection Time    01/02/13  2:45 PM      Result Value Range   Glucose-Capillary 112 (*) 70 - 99 mg/dL    Ct Head Wo Contrast  01/02/2013   CLINICAL DATA:  Right-sided weakness.  EXAM: CT HEAD WITHOUT CONTRAST  TECHNIQUE: Contiguous axial images were obtained from the base of the skull through the vertex without intravenous contrast.  COMPARISON:  None.  FINDINGS: Sinuses/Soft tissues: Clear paranasal sinuses and mastoid air cells.  Intracranial: Large area of left parietal hypoattenuation and mass effect. This causes partial effacement of the occipital horn of the left lateral ventricle. 1-2 mm of left-to-right midline shift.  No complicating hemorrhage.  Basal cisterns patent.  Apparent hypoattenuation in the left cerebellar hemisphere on image 6/series 2 is favored to be a artifactual.  No hydrocephalus, intra-axial, or extra-axial fluid collection.  IMPRESSION: 1. Large area of edema within the left parietal lobe. Given the clinical history, this most likely represents a subacute infarct. Given the appearance, differential consideration includes a mass lesion such as a metastasis. There is mass effect and minimal midline shift. Critical test results telephoned to.Dr. Regenia Skeeter. at the time of interpretation at 2:30 p.m.on 01/02/2013. 2. Probable artifactual hypoattenuation in the left cerebellar hemisphere. If the patient undergoes MRI, recommend attention to  this area.   Electronically Signed   By: Abigail Miyamoto M.D.   On: 01/02/2013 14:31    Assessment and plan per attending neurologist  Etta Quill PA-C [facility] [phone]  01/02/2013, 2:52 PM   Assessment/Plan:  68 YO male presenting to [facility] hospital with simple partial motor seizure of right arm and leg which responded to 4 mg Ativan. CT findings concerning for subacute left parietal infarct versus mass with edema.    Recommend: 1) MRI brain with and without contrast 2) Keppra 500 mg BID IV 3) ICU or step down unit 4) BP control  Patient seen and examined together with physician assistant and I concur with the assessment and plan.  Dorian Pod, MD

## 2013-01-02 NOTE — ED Notes (Signed)
To ED via [facility] from [facility]--with family c/o unable to stand up and weak on right side. On arrival to ED pt having tonic clonic type movements in right arm and leg, alert and oriented during episodes. To CT 2 at 1410

## 2013-01-02 NOTE — ED Provider Notes (Signed)
CSN: [id number]     Arrival date & time 01/02/13  1408 History   First MD Initiated Contact with Patient 01/02/13 1414     No chief complaint on file.  (Consider location/radiation/quality/duration/timing/severity/associated sxs/prior Treatment) HPI Comments: 68 year old male with acute onset of right arm and right leg weakness and pain. Less than 2 hours prior to arrival he was normal. The patient denies any headaches, numbness or blurry vision. Not been ill recently. He does not have a primary care provider. He has a known history of hypertension she states he has never had a stroke before. No slurred speech.   Past Medical History  Diagnosis Date  . Medical history non-contributory    Past Surgical History  Procedure Laterality Date  . No past surgeries     Family History  Problem Relation Age of Onset  . Heart Problems Mother    History  Substance Use Topics  . Smoking status: Current Every Day Smoker -- 0.50 packs/day for 25 years    Types: Cigarettes  . Smokeless tobacco: Never Used  . Alcohol Use: Yes     Comment: occasionally    Review of Systems  Constitutional: Negative for fever and chills.  Respiratory: Negative for shortness of breath.   Gastrointestinal: Negative for vomiting.  Neurological: Positive for seizures and weakness. Negative for headaches.  All other systems reviewed and are negative.    Allergies  Review of patient's allergies indicates no known allergies.  Home Medications   Current Outpatient Rx  Name  Route  Sig  Dispense  Refill  . amLODipine (NORVASC) 5 MG tablet   Oral   Take 1 tablet (5 mg total) by mouth daily.   30 tablet   1   . aspirin EC 325 MG EC tablet   Oral   Take 1 tablet (325 mg total) by mouth daily.   30 tablet   1   . HYDROcodone-acetaminophen (NORCO/VICODIN) 5-325 MG per tablet   Oral   Take 1-2 tablets by mouth every 4 (four) hours as needed.   30 tablet   0   . lisinopril (PRINIVIL,ZESTRIL) 5 MG tablet  Oral   Take 1 tablet (5 mg total) by mouth daily.   30 tablet   1   . nitroGLYCERIN (NITROSTAT) 0.4 MG SL tablet   Sublingual   Place 1 tablet (0.4 mg total) under the tongue every 5 (five) minutes as needed for chest pain.   30 tablet   12    There were no vitals taken for this visit. Physical Exam  Nursing note and vitals reviewed. Constitutional: He is oriented to person, place, and time. He appears well-developed and well-nourished.  HENT:  Head: Normocephalic and atraumatic.  Right Ear: External ear normal.  Left Ear: External ear normal.  Nose: Nose normal.  Eyes: Right eye exhibits no discharge. Left eye exhibits no discharge.  Neck: Neck supple.  Cardiovascular: Normal rate, regular rhythm, normal heart sounds and intact distal pulses.   Pulmonary/Chest: Effort normal.  Abdominal: Soft. There is no tenderness.  Musculoskeletal: He exhibits no edema.  Neurological: He is alert and oriented to person, place, and time. No cranial nerve deficit or sensory deficit. He exhibits abnormal muscle tone (increased tone to right upper extremity with mild shaking). GCS eye subscore is 4. GCS verbal subscore is 5. GCS motor subscore is 6.  Decreased strength right upper and lower extremities.  Skin: Skin is warm and dry.    ED Course  Procedures (including critical  care time) Labs Review Labs Reviewed  APTT - Abnormal; Notable for the following:    aPTT 23 (*)    All other components within normal limits  COMPREHENSIVE METABOLIC PANEL - Abnormal; Notable for the following:    Glucose, Bld 127 (*)    Creatinine, Ser 1.65 (*)    Total Protein 8.4 (*)    GFR calc non Af Amer 41 (*)    GFR calc Af Amer 48 (*)    All other components within normal limits  GLUCOSE, CAPILLARY - Abnormal; Notable for the following:    Glucose-Capillary 112 (*)    All other components within normal limits  POCT I-STAT, CHEM 8 - Abnormal; Notable for the following:    Creatinine, Ser 1.80 (*)     Glucose, Bld 127 (*)    All other components within normal limits  ETHANOL  PROTIME-INR  CBC  DIFFERENTIAL  TROPONIN I  URINE RAPID DRUG SCREEN (HOSP PERFORMED)  URINALYSIS, ROUTINE W REFLEX MICROSCOPIC  POCT I-STAT TROPONIN I   Imaging Review Ct Head Wo Contrast  01/02/2013   CLINICAL DATA:  Right-sided weakness.  EXAM: CT HEAD WITHOUT CONTRAST  TECHNIQUE: Contiguous axial images were obtained from the base of the skull through the vertex without intravenous contrast.  COMPARISON:  None.  FINDINGS: Sinuses/Soft tissues: Clear paranasal sinuses and mastoid air cells.  Intracranial: Large area of left parietal hypoattenuation and mass effect. This causes partial effacement of the occipital horn of the left lateral ventricle. 1-2 mm of left-to-right midline shift.  No complicating hemorrhage.  Basal cisterns patent.  Apparent hypoattenuation in the left cerebellar hemisphere on image 6/series 2 is favored to be a artifactual.  No hydrocephalus, intra-axial, or extra-axial fluid collection.  IMPRESSION: 1. Large area of edema within the left parietal lobe. Given the clinical history, this most likely represents a subacute infarct. Given the appearance, differential consideration includes a mass lesion such as a metastasis. There is mass effect and minimal midline shift. Critical test results telephoned to.Dr. Regenia Skeeter. at the time of interpretation at 2:30 p.m.on 01/02/2013. 2. Probable artifactual hypoattenuation in the left cerebellar hemisphere. If the patient undergoes MRI, recommend attention to this area.   Electronically Signed   By: Abigail Miyamoto M.D.   On: 01/02/2013 14:31    EKG Interpretation    Date/Time:  Friday January 02 2013 15:07:04 EST Ventricular Rate:  67 PR Interval:  147 QRS Duration: 92 QT Interval:  416 QTC Calculation: 439 R Axis:   82 Text Interpretation:  Sinus rhythm Probable left atrial enlargement Borderline right axis deviation Anteroseptal infarct, old Abnrm T,  consider ischemia, anterolateral lds Confirmed by Pershing Skidmore  MD, Ajai Harville ([id number]) on 01/02/2013 3:40:59 PM            MDM   1. CVA (cerebral infarction)   2. Partial seizure    Patient's presentation is consistent with acute stroke. However there is concern that this may be an underlying neck causing 3 broad edema. More likely it is acute stroke, however given his partial seizure he will need an acute MRI. Is not a candidate for acute intervention at this time. He shaking/seizure did stop with Ativan and Keppra. Stable for the floor. His blood pressure did improve just by controlling the seizure.    Ephraim Hamburger, MD 01/02/13 [phone]

## 2013-01-02 NOTE — ED Notes (Signed)
  Ronne Stefanski (Father) [phone]  Sahith Nurse (cousin) [phone]

## 2013-01-03 ENCOUNTER — Inpatient Hospital Stay (HOSPITAL_COMMUNITY): Payer: Medicare Other

## 2013-01-03 DIAGNOSIS — F1410 Cocaine abuse, uncomplicated: Secondary | ICD-10-CM

## 2013-01-03 DIAGNOSIS — I10 Essential (primary) hypertension: Secondary | ICD-10-CM

## 2013-01-03 DIAGNOSIS — I059 Rheumatic mitral valve disease, unspecified: Secondary | ICD-10-CM

## 2013-01-03 LAB — COMPREHENSIVE METABOLIC PANEL
ALT: 9 U/L (ref 0–53)
AST: 14 U/L (ref 0–37)
Albumin: 3.1 g/dL — ABNORMAL LOW (ref 3.5–5.2)
Alkaline Phosphatase: 45 U/L (ref 39–117)
BUN: 18 mg/dL (ref 6–23)
CALCIUM: 8.7 mg/dL (ref 8.4–10.5)
CO2: 26 meq/L (ref 19–32)
Chloride: 105 mEq/L (ref 96–112)
Creatinine, Ser: 1.42 mg/dL — ABNORMAL HIGH (ref 0.50–1.35)
GFR calc Af Amer: 58 mL/min — ABNORMAL LOW (ref >90)
GFR, EST NON AFRICAN AMERICAN: 50 mL/min — AB (ref >90)
Glucose, Bld: 105 mg/dL — ABNORMAL HIGH (ref 70–99)
Potassium: 4.9 mEq/L (ref 3.7–5.3)
Sodium: 142 mEq/L (ref 137–147)
TOTAL PROTEIN: 7.2 g/dL (ref 6.0–8.3)
Total Bilirubin: <0.2 mg/dL — ABNORMAL LOW (ref 0.3–1.2)

## 2013-01-03 LAB — CBC
HEMATOCRIT: 39 % (ref 39.0–52.0)
Hemoglobin: 12.6 g/dL — ABNORMAL LOW (ref 13.0–17.0)
MCH: 29.2 pg (ref 26.0–34.0)
MCHC: 32.3 g/dL (ref 30.0–36.0)
MCV: 90.3 fL (ref 78.0–100.0)
Platelets: 238 10*3/uL (ref 150–400)
RBC: 4.32 MIL/uL (ref 4.22–5.81)
RDW: 13.5 % (ref 11.5–15.5)
WBC: 4.8 10*3/uL (ref 4.0–10.5)

## 2013-01-03 LAB — TROPONIN I

## 2013-01-03 LAB — GLUCOSE, CAPILLARY: Glucose-Capillary: 123 mg/dL — ABNORMAL HIGH (ref 70–99)

## 2013-01-03 MED ORDER — SALINE SPRAY 0.65 % NA SOLN
1.0000 | NASAL | Status: DC | PRN
  Administered 2013-01-04: 1 via NASAL
  Filled 2013-01-03 (×2): qty 44

## 2013-01-03 MED ORDER — GADOBENATE DIMEGLUMINE 529 MG/ML IV SOLN
15.0000 mL | Freq: Once | INTRAVENOUS | Status: AC | PRN
  Administered 2013-01-03: 15 mL via INTRAVENOUS

## 2013-01-03 MED ORDER — LEVETIRACETAM 500 MG PO TABS
500.0000 mg | ORAL_TABLET | Freq: Two times a day (BID) | ORAL | Status: DC
Start: 2013-01-03 — End: 2013-01-04
  Administered 2013-01-03 (×2): 500 mg via ORAL
  Filled 2013-01-03 (×4): qty 1

## 2013-01-03 MED ORDER — AMLODIPINE BESYLATE 5 MG PO TABS
5.0000 mg | ORAL_TABLET | Freq: Every day | ORAL | Status: DC
  Administered 2013-01-03 – 2013-01-05 (×3): 5 mg via ORAL
  Filled 2013-01-03 (×3): qty 1

## 2013-01-03 MED ORDER — LORAZEPAM 2 MG/ML IJ SOLN
1.0000 mg | Freq: Once | INTRAMUSCULAR | Status: DC
  Filled 2013-01-03: qty 1

## 2013-01-03 MED ORDER — ENOXAPARIN SODIUM 40 MG/0.4ML ~~LOC~~ SOLN
40.0000 mg | SUBCUTANEOUS | Status: DC
  Administered 2013-01-03 – 2013-01-09 (×6): 40 mg via SUBCUTANEOUS
  Filled 2013-01-03 (×7): qty 0.4

## 2013-01-03 NOTE — Progress Notes (Signed)
Echocardiogram 2D Echocardiogram has been performed.  Joelene Millin 01/03/2013, 11:33 AM

## 2013-01-03 NOTE — Progress Notes (Signed)
VASCULAR LAB PRELIMINARY  PRELIMINARY  PRELIMINARY  PRELIMINARY  Carotid Dopplers completed.    Preliminary report: Right: 1-39% ICA stenosis.  Left: 60-79% stenosis at the bulb.  Bilateral vertebral artery flow is antegrade.  Ephraim Reichel, RVT 01/03/2013, 12:49 PM

## 2013-01-03 NOTE — Progress Notes (Addendum)
[facility] PROGRESS NOTE  Carlos Beasley XTG:[id number] DOB: May 29, 1945 DOA: 01/02/2013 PCP: No primary provider on file.  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%  Restart ACE inhibitor when able  This is likely cocaine induced   -resume diet after SLP eval  Code Status: full Family Communication: patient Disposition Plan:    Consultants:  neuro  Procedures:    Antibiotics:    HPI/Subjective:  asking to eat something Says he is feeling better  Objective: Filed Vitals:   01/03/13 0737  BP: 154/89  Pulse: 68  Temp: 97.2 F (36.2 C)  Resp: 16    Intake/Output Summary (Last 24 hours) at 01/03/13 0901 Last data filed at 01/03/13 0742  Gross per 24 hour  Intake  692.5 ml  Output    750 ml  Net  -57.5 ml   Filed Weights   01/02/13 1800  Weight: 71 kg (156 lb 8.4 oz)    Exam:   General:  Awake, NAD  Cardiovascular: rrr  Respiratory: clear anterior  Abdomen: +BS, soft  Musculoskeletal: moves all 4 ext   Data Reviewed: Basic Metabolic Panel:  Recent Labs Lab 01/02/13 1410 01/02/13 1420 01/02/13 1600 01/03/13 0255  NA 143 143  --  142  K 4.8 4.6  --  4.9  CL 103 105  --  105  CO2 24  --   --  26  GLUCOSE 127* 127*  --  105*  BUN 19 22  --  18  CREATININE 1.65* 1.80*  --  1.42*  CALCIUM 9.3  --   --  8.7  MG  --   --  2.2  --    Liver Function Tests:  Recent Labs Lab 01/02/13 1410 01/02/13 1600 01/03/13 0255   AST 19 15 14   ALT 11 10 9   ALKPHOS 50 45 45  BILITOT 0.4 0.3 <0.2*  PROT 8.4* 7.4 7.2  ALBUMIN 3.7 3.4* 3.1*   No results found for this basename: LIPASE, AMYLASE,  in the last 168 hours No results found for this basename: AMMONIA,  in the last 168 hours CBC:  Recent Labs Lab 01/02/13 1410 01/02/13 1420 01/02/13 1600 01/03/13 0255  WBC 4.9  --  5.0 4.8  NEUTROABS 2.2  --   --   --   HGB 13.5 14.3 11.9* 12.6*  HCT 41.4 42.0 36.2* 39.0  MCV 90.6  --  91.2 90.3  PLT 238  --  225 238   Cardiac Enzymes:  Recent Labs Lab 01/02/13 1410 01/02/13 1600 01/02/13 2130 01/03/13 0255  TROPONINI <0.30 <0.30 <0.30 <0.30   BNP (last 3 results) No results found for this basename: PROBNP,  in the last 8760 hours CBG:  Recent Labs Lab 01/02/13 1445 01/03/13 0605  GLUCAP 112* 123*    Recent Results (from the past 240 hour(s))  MRSA PCR SCREENING     Status: None   Collection Time    01/02/13  5:56 PM      Result Value  Range Status   MRSA by PCR NEGATIVE  NEGATIVE Final   Comment:            The GeneXpert MRSA Assay (FDA     approved for NASAL specimens     only), is one component of a     comprehensive MRSA colonization     surveillance program. It is not     intended to diagnose MRSA     infection nor to guide or     monitor treatment for     MRSA infections.     Studies: Ct Head Wo Contrast  01/02/2013   CLINICAL DATA:  Right-sided weakness.  EXAM: CT HEAD WITHOUT CONTRAST  TECHNIQUE: Contiguous axial images were obtained from the base of the skull through the vertex without intravenous contrast.  COMPARISON:  None.  FINDINGS: Sinuses/Soft tissues: Clear paranasal sinuses and mastoid air cells.  Intracranial: Large area of left parietal hypoattenuation and mass effect. This causes partial effacement of the occipital horn of the left lateral ventricle. 1-2 mm of left-to-right midline shift.  No complicating hemorrhage.  Basal cisterns patent.  Apparent hypoattenuation in  the left cerebellar hemisphere on image 6/series 2 is favored to be a artifactual.  No hydrocephalus, intra-axial, or extra-axial fluid collection.  IMPRESSION: 1. Large area of edema within the left parietal lobe. Given the clinical history, this most likely represents a subacute infarct. Given the appearance, differential consideration includes a mass lesion such as a metastasis. There is mass effect and minimal midline shift. Critical test results telephoned to.Dr. Regenia Skeeter. at the time of interpretation at 2:30 p.m.on 01/02/2013. 2. Probable artifactual hypoattenuation in the left cerebellar hemisphere. If the patient undergoes MRI, recommend attention to this area.   Electronically Signed   By: Abigail Miyamoto M.D.   On: 01/02/2013 14:31    Scheduled Meds: . aspirin EC  325 mg Oral Daily  . aspirin  300 mg Rectal Daily  . enoxaparin (LOVENOX) injection  30 mg Subcutaneous Q24H  . levETIRAcetam  500 mg Intravenous Q12H   Continuous Infusions: . sodium chloride 50 mL/hr at 01/02/13 1615    Active Problems:   Stroke   Seizure cerebral    Time spent: 35 min    Silvina Hackleman  [facility] Pager [phone]. If 7PM-7AM, please contact night-coverage at [email or web address], password [facility] 01/03/2013, 9:01 AM  LOS: 1 day

## 2013-01-03 NOTE — Evaluation (Signed)
Clinical/Bedside Swallow Evaluation Patient Details  Name: Carlos Beasley MRN: [id number] Date of Birth: [birth year]  Today's Date: 01/03/2013 Time: 1200-1220 SLP Time Calculation (min): 20 min  Past Medical History:  Past Medical History  Diagnosis Date  . Medical history non-contributory   . Hypertension    Past Surgical History:  Past Surgical History  Procedure Laterality Date  . No past surgeries     HPI:  68 year old male presents to [facility] via [place] as code stroke.  Per report patient was LSW at 1200 - then was found leaning against a wall with right sided weakness.  EMS reports active right side seizure on their arrival.  On arrival to ED patient was alert - having right side tonic clonic type movements  = he is alert and able to answer questions and follow some commands.  NIHSS 6 - see note for details.  CT scan report per Dr. Aram Beecham ? Acute infarct vs mass with an edema pattern.    Patient with worsening seizure activity - given total 4 mg Ativan IV - then loaded with 1000 mg Keppra.  Also given 10 mg Decadron per MD order.  Seizure activity controlled - patient sleepy but arouses - maintaining airway and O2 sats - right arm/leg weakness remains - has tone.  BSE indicated per Stroke Protocol.    Assessment / Plan / Recommendation Clinical Impression  BSE completed.  Oral phase affected by lack of dentition, decreased sensory and lingual strength.  Slight delay in initiation of the swallow with slow hyoid laryngeal elevation per palpation.  Soft signs of suspected penetration s/p sips of thin liquid in succession.  No other s/s of aspiration noted.  Due to weakness but mostly due to lack of dentition recommend dysphagia 3 ( mechanical soft) and thin liquids by cup sips only.  Recommend intermittent supervision with meals and monitor closely for aspiration.  ST to follow closely in acute care setting for diet tolerance due to increased risk for silent aspiration.  Completion of objective  evaluation to be determined pending results of MRI.       Aspiration Risk  Moderate    Diet Recommendation Dysphagia 3 (Mechanical Soft);Thin liquid   Liquid Administration via: Cup;No straw Medication Administration: Crushed with puree Supervision: Staff to assist with self feeding;Full supervision/cueing for compensatory strategies Compensations: Slow rate;Small sips/bites;Multiple dry swallows after each bite/sip;Clear throat after each swallow;Hard cough after swallow Postural Changes and/or Swallow Maneuvers: Seated upright 90 degrees;Upright 30-60 min after meal    Other  Recommendations Oral Care Recommendations: Oral care Q4 per protocol   Follow Up Recommendations   (TBD)    Frequency and Duration min 2x/week  2 weeks       SLP Swallow Goals Please refer to Care Plans for listed goals   Swallow Study Prior Functional Status   Lives at home.  No prior reports of dysphagia     General Date of Onset: 01/02/13 HPI: 68 year old male presents to [facility] via [place] as code stroke.  Per report patient was LSW at 1200 - then was found leaning against a wall with right sided weakness.  EMS reports active right side seizure on their arrival.  On arrival to ED patient was alert - having right side tonic clonic type movements  = he is alert and able to answer questions and follow some commands.  NIHSS 6 - see note for details.  CT scan report per Dr. Aram Beecham ? Acute infarct vs mass with  an edema pattern.    Patient with worsening seizure activity - given total 4 mg Ativan IV - then loaded with 1000 mg Keppra.  Also given 10 mg Decadron per MD order.  Seizure activity controlled - patient sleepy but arouses - maintaining airway and O2 sats - right arm/leg weakness remains - has tone Type of Study: Bedside swallow evaluation Diet Prior to this Study: NPO Temperature Spikes Noted: No Respiratory Status: Room air History of Recent Intubation: No Behavior/Cognition: Alert;Cooperative;Pleasant  mood Oral Cavity - Dentition: Poor condition;Missing dentition Self-Feeding Abilities: Able to feed self;Needs assist;Needs set up Patient Positioning: Upright in bed Baseline Vocal Quality: Clear Volitional Cough: Strong Volitional Swallow: Able to elicit    Oral/Motor/Sensory Function Overall Oral Motor/Sensory Function: Impaired Labial ROM: Reduced right Labial Symmetry: Within Functional Limits Labial Strength: Reduced Labial Sensation: Reduced Lingual ROM: Within Functional Limits Lingual Symmetry: Abnormal symmetry right Lingual Strength: Reduced Lingual Sensation: Reduced Facial ROM: Within Functional Limits Facial Symmetry: Within Functional Limits Facial Strength: Within Functional Limits Facial Sensation: Within Functional Limits Velum: Within Functional Limits Mandible: Within Functional Limits   Ice Chips Ice chips: Impaired Pharyngeal Phase Impairments: Suspected delayed Swallow;Decreased hyoid-laryngeal movement   Thin Liquid Thin Liquid: Impaired Presentation: Cup;Spoon Pharyngeal  Phase Impairments: Suspected delayed Swallow;Decreased hyoid-laryngeal movement;Multiple swallows    Nectar Thick Nectar Thick Liquid: Not tested   Honey Thick Honey Thick Liquid: Not tested   Puree Puree: Within functional limits   Solid   GO    Solid: Impaired Pharyngeal Phase Impairments: Throat Clearing - Delayed;Suspected delayed Swallow;Decreased hyoid-laryngeal movement      Sharman Crate [place], [facility] 01/03/2013,12:21 PM

## 2013-01-03 NOTE — Progress Notes (Signed)
NEURO HOSPITALIST PROGRESS NOTE   SUBJECTIVE:                                                                                                                        Feels better this morning.  No further seizures noted. On IV keppra 500 mg BID. Afebrile. MRI brain pending.   OBJECTIVE:                                                                                                                           Vital signs in last 24 hours: Temp:  [97.2 F (36.2 C)-99.2 F (37.3 C)] 97.2 F (36.2 C) (01/03 0737) Pulse Rate:  [58-74] 68 (01/03 0737) Resp:  [10-22] 16 (01/03 0737) BP: (129-209)/(78-125) 154/89 mmHg (01/03 0737) SpO2:  [97 %-100 %] 100 % (01/03 0737) Weight:  [71 kg (156 lb 8.4 oz)] 71 kg (156 lb 8.4 oz) (01/02 1800)  Intake/Output from previous day: 01/02 0701 - 01/03 0700 In: 692.5 [I.V.:587.5; IV Piggyback:105] Out: 650 [Urine:650] Intake/Output this shift: Total I/O In: -  Out: 100 [Urine:100] Nutritional status: NPO  Past Medical History  Diagnosis Date  . Medical history non-contributory   . Hypertension     Neurologic Exam:  Mental status: alert, awake, oriented x 4. Comprehension, naming, repetition intact. No dysarthria or dysphasia. Cranial Nerves:  II: Discs flat bilaterally; Visual fields grossly normal, pupils equal, round, reactive to light and accommodation  III,IV, VI: ptosis not present, extra-ocular motions intact bilaterally  V,VII: smile symmetric, facial light touch sensation normal bilaterally  VIII: hearing normal bilaterally  IX,X: gag reflex present  XI: bilateral shoulder shrug  XII: midline tongue extension without atrophy or fasciculations  Motor:  Significant for mild right HP.  Tone and bulk:normal tone throughout; no atrophy noted  Sensory: Pinprick and light touch intact throughout, bilaterally  Deep Tendon Reflexes:  Right: Upper Extremity Left: Upper extremity  biceps (C-5 to C-6)  2/4 biceps (C-5 to C-6) 2/4  tricep (C7) 2/4 triceps (C7) 2/4  Brachioradialis (C6) 2/4 Brachioradialis (C6) 2/4  Lower Extremity Lower Extremity  quadriceps (L-2 to L-4) 1/4 quadriceps (L-2 to L-4) 1/4  Achilles (S1) 1/4 Achilles (S1) 1/4  Plantars:  [place] bilaterally  Cerebellar:  normal  finger-to-nose on the left, normal heel-to-shin test on the left  Gait: not tested.   Lab Results: Lab Results  Component Value Date/Time   CHOL 185 01/02/2013  4:00 PM   Lipid Panel  Recent Labs  01/02/13 1600  CHOL 185  TRIG 82  HDL 74  CHOLHDL 2.5  VLDL 16  LDLCALC 95    Studies/Results: Ct Head Wo Contrast  01/02/2013   CLINICAL DATA:  Right-sided weakness.  EXAM: CT HEAD WITHOUT CONTRAST  TECHNIQUE: Contiguous axial images were obtained from the base of the skull through the vertex without intravenous contrast.  COMPARISON:  None.  FINDINGS: Sinuses/Soft tissues: Clear paranasal sinuses and mastoid air cells.  Intracranial: Large area of left parietal hypoattenuation and mass effect. This causes partial effacement of the occipital horn of the left lateral ventricle. 1-2 mm of left-to-right midline shift.  No complicating hemorrhage.  Basal cisterns patent.  Apparent hypoattenuation in the left cerebellar hemisphere on image 6/series 2 is favored to be a artifactual.  No hydrocephalus, intra-axial, or extra-axial fluid collection.  IMPRESSION: 1. Large area of edema within the left parietal lobe. Given the clinical history, this most likely represents a subacute infarct. Given the appearance, differential consideration includes a mass lesion such as a metastasis. There is mass effect and minimal midline shift. Critical test results telephoned to.Dr. Regenia Skeeter. at the time of interpretation at 2:30 p.m.on 01/02/2013. 2. Probable artifactual hypoattenuation in the left cerebellar hemisphere. If the patient undergoes MRI, recommend attention to this area.   Electronically Signed   By: Abigail Miyamoto M.D.    On: 01/02/2013 14:31    MEDICATIONS                                                                                                                       I have reviewed the patient's current medications.  ASSESSMENT/PLAN:                                                                                                           68 y/o admitted to [facility] with new onset right focal motor seizures, right hemiparesis, and CT brain revealing large area of edema within the left parietal lobe, suggestive of mass versus subacute infarct. PRES can not be entirely excluded as he was very hypertensive upon arrival. Will continue keppra 500 mg BID (can be switch to PO). MRI brain with and without contrast this morning. Will follow up.  Dorian Pod, MD [facility] [phone]  01/03/2013, 9:26 AM

## 2013-01-04 ENCOUNTER — Inpatient Hospital Stay (HOSPITAL_COMMUNITY): Payer: Medicare Other

## 2013-01-04 DIAGNOSIS — G936 Cerebral edema: Secondary | ICD-10-CM

## 2013-01-04 DIAGNOSIS — G9389 Other specified disorders of brain: Secondary | ICD-10-CM

## 2013-01-04 LAB — RAPID URINE DRUG SCREEN, HOSP PERFORMED
Amphetamines: NOT DETECTED
BARBITURATES: NOT DETECTED
Benzodiazepines: NOT DETECTED
Cocaine: POSITIVE — AB
Opiates: NOT DETECTED
TETRAHYDROCANNABINOL: NOT DETECTED

## 2013-01-04 LAB — HIV ANTIBODY (ROUTINE TESTING W REFLEX): HIV: NONREACTIVE

## 2013-01-04 LAB — C-REACTIVE PROTEIN: CRP: <0.5 mg/dL — ABNORMAL LOW (ref <0.60)

## 2013-01-04 LAB — GLUCOSE, CAPILLARY: Glucose-Capillary: 84 mg/dL (ref 70–99)

## 2013-01-04 LAB — SEDIMENTATION RATE: Sed Rate: 32 mm/hr — ABNORMAL HIGH (ref 0–16)

## 2013-01-04 MED ORDER — LEVETIRACETAM 500 MG PO TABS
1000.0000 mg | ORAL_TABLET | Freq: Two times a day (BID) | ORAL | Status: DC
  Administered 2013-01-04 – 2013-01-09 (×11): 1000 mg via ORAL
  Filled 2013-01-04 (×14): qty 2

## 2013-01-04 MED ORDER — IOHEXOL 300 MG/ML  SOLN
100.0000 mL | Freq: Once | INTRAMUSCULAR | Status: AC | PRN
  Administered 2013-01-04: 100 mL via INTRAVENOUS

## 2013-01-04 MED ORDER — DEXAMETHASONE SODIUM PHOSPHATE 4 MG/ML IJ SOLN
4.0000 mg | Freq: Four times a day (QID) | INTRAMUSCULAR | Status: DC
  Administered 2013-01-04 – 2013-01-07 (×12): 4 mg via INTRAVENOUS
  Filled 2013-01-04 (×24): qty 1

## 2013-01-04 MED ORDER — IOHEXOL 300 MG/ML  SOLN: 25.0000 mL | INTRAMUSCULAR | Status: AC

## 2013-01-04 MED ORDER — CHLORHEXIDINE GLUCONATE 0.12 % MT SOLN
15.0000 mL | Freq: Two times a day (BID) | OROMUCOSAL | Status: DC
  Administered 2013-01-04 – 2013-01-09 (×10): 15 mL via OROMUCOSAL
  Filled 2013-01-04 (×12): qty 15

## 2013-01-04 MED ORDER — BIOTENE DRY MOUTH MT LIQD
15.0000 mL | Freq: Two times a day (BID) | OROMUCOSAL | Status: DC
  Administered 2013-01-05 – 2013-01-08 (×7): 15 mL via OROMUCOSAL

## 2013-01-04 NOTE — Progress Notes (Signed)
NEURO HOSPITALIST PROGRESS NOTE   SUBJECTIVE:                                                                                                                        No neurological complains at this moment, but stated that around 9 pm last night he had another episode of uncontrollable stiffening of the right arm. MRI brain with and without contrast last night demonstrated an 11 x 13 mm enhancing lesion in the left parietal lobe with pronounced regional vasogenic edema causing mass-effect on the posterior aspect of the left lateral ventricle.  Second enhancing focus in the inferior cerebellum on the left measuring 6 mm in diameter with mild regional edema. On keppra 500 mg BID.   OBJECTIVE:                                                                                                                           Vital signs in last 24 hours: Temp:  [97.2 F (36.2 C)-98.1 F (36.7 C)] 97.8 F (36.6 C) (01/04 0453) Pulse Rate:  [51-80] 53 (01/04 0453) Resp:  [10-25] 11 (01/04 0453) BP: (110-155)/(83-89) 110/83 mmHg (01/04 0453) SpO2:  [99 %-100 %] 99 % (01/04 0453)  Intake/Output from previous day: 01/03 0701 - 01/04 0700 In: 1320 [P.O.:120; I.V.:1200] Out: 400 [Urine:400] Intake/Output this shift:   Nutritional status: Dysphagia  Past Medical History  Diagnosis Date  . Medical history non-contributory   . Hypertension     Neurologic Exam:  Mental status: alert, awake, oriented x 4. Comprehension, naming, repetition intact. No dysarthria or dysphasia.  Cranial Nerves:  II: Discs flat bilaterally; Visual fields grossly normal, pupils equal, round, reactive to light and accommodation  III,IV, VI: ptosis not present, extra-ocular motions intact bilaterally  V,VII: smile symmetric, facial light touch sensation normal bilaterally  VIII: hearing normal bilaterally  IX,X: gag reflex present  XI: bilateral shoulder shrug  XII: midline tongue  extension without atrophy or fasciculations  Motor:  Significant for mild right HP.  Tone and bulk:normal tone throughout; no atrophy noted  Sensory: Pinprick and light touch intact throughout, bilaterally  Deep Tendon Reflexes:  Right: Upper Extremity Left: Upper extremity  biceps (C-5 to C-6) 2/4 biceps (C-5 to C-6) 2/4  tricep (C7) 2/4 triceps (C7) 2/4  Brachioradialis (C6) 2/4 Brachioradialis (C6) 2/4  Lower Extremity Lower Extremity  quadriceps (L-2 to L-4) 1/4 quadriceps (L-2 to L-4) 1/4  Achilles (S1) 1/4 Achilles (S1) 1/4  Plantars:  Mute bilaterally  Cerebellar:  normal finger-to-nose on the left, normal heel-to-shin test on the left  Gait: not tested.   Lab Results: Lab Results  Component Value Date/Time   CHOL 185 01/02/2013  4:00 PM   Lipid Panel  Recent Labs  01/02/13 1600  CHOL 185  TRIG 82  HDL 74  CHOLHDL 2.5  VLDL 16  LDLCALC 95    Studies/Results: Ct Head Wo Contrast  01/02/2013   CLINICAL DATA:  Right-sided weakness.  EXAM: CT HEAD WITHOUT CONTRAST  TECHNIQUE: Contiguous axial images were obtained from the base of the skull through the vertex without intravenous contrast.  COMPARISON:  None.  FINDINGS: Sinuses/Soft tissues: Clear paranasal sinuses and mastoid air cells.  Intracranial: Large area of left parietal hypoattenuation and mass effect. This causes partial effacement of the occipital horn of the left lateral ventricle. 1-2 mm of left-to-right midline shift.  No complicating hemorrhage.  Basal cisterns patent.  Apparent hypoattenuation in the left cerebellar hemisphere on image 6/series 2 is favored to be a artifactual.  No hydrocephalus, intra-axial, or extra-axial fluid collection.  IMPRESSION: 1. Large area of edema within the left parietal lobe. Given the clinical history, this most likely represents a subacute infarct. Given the appearance, differential consideration includes a mass lesion such as a metastasis. There is mass effect and minimal  midline shift. Critical test results telephoned to.Dr. Regenia Skeeter. at the time of interpretation at 2:30 p.m.on 01/02/2013. 2. Probable artifactual hypoattenuation in the left cerebellar hemisphere. If the patient undergoes MRI, recommend attention to this area.   Electronically Signed   By: Abigail Miyamoto M.D.   On: 01/02/2013 14:31   Mr Jeri Cos PP Contrast  01/03/2013   CLINICAL DATA:  Seizure activity. History of drug abuse. Right-sided weakness.  EXAM: MRI HEAD WITHOUT AND WITH CONTRAST  TECHNIQUE: Multiplanar, multiecho pulse sequences of the brain and surrounding structures were obtained without and with intravenous contrast.  CONTRAST:  39mL MULTIHANCE GADOBENATE DIMEGLUMINE 529 MG/ML IV SOLN  COMPARISON:  Head CT 01/02/2013  FINDINGS: There is no evidence of ischemic infarction. Within the left parietal lobe, there is an 11 x 13 mm enhancing focus with pronounced regional vasogenic edema. A 2nd enhancing focus is present within the inferior cerebellum on the left measuring 6 mm in diameter. There is minimal edema in that region. Elsewhere, there are a few punctate foci of T2 and FLAIR signal in the hemispheric white matter not associated with abnormal enhancement and most likely indicative of old small vessel insults. There is a small focus of hemorrhage within the region of edema in the left parietal lobe. This is no larger than 3-4 mm. No hydrocephalus or extra-axial collection. Because of the edema, there is mass-effect on the posterior aspect of the left lateral ventricle. No midline shift. No pituitary mass. Sinuses are clear. No skull or skullbase lesion.  IMPRESSION: 11 x 13 mm enhancing lesion in the left parietal lobe with pronounced regional vasogenic edema. Second enhancing focus in the inferior cerebellum on the left measuring 6 mm in diameter with mild regional edema. The differential diagnosis is metastatic disease versus areas of focal cerebritis. Given the history of drug abuse, infectious  cerebritis is certainly possible. The amount of vasogenic edema associated with the left parietal  lesion is extreme, a finding often associated with acute cerebritis. .   Electronically Signed   By: Nelson Chimes M.D.   On: 01/03/2013 19:30    MEDICATIONS                                                                                                                       I have reviewed the patient's current medications.  ASSESSMENT/PLAN:                                                                                                           69 y/o with new onset structural right focal motor seizure in the context of  11 x 13 mm enhancing lesion in the left parietal lobe with pronounced regional vasogenic edema causing mass-effect on the posterior aspect of the left lateral ventricle. Second enhancing focus in the inferior cerebellum on the left measuring 6 mm in diameter with mild regional edema. 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 Significant vasogenic edema with mass effect precludes LP at this moment. Will start IV decadron 4 mg every 6 hours. Empiric antibiotic treatment pending ID recommendations. CT chest/abdomen/pelvis with an without contrast. Will suggest getting ID and neurosurgery consultation. Had another focal seizure last night, thus will increase keppra to 1,000 mg BID. Will follow up.  Dorian Pod, MD [facility] [phone]  01/04/2013, 7:34 AM

## 2013-01-04 NOTE — Consult Note (Signed)
Reason for Consult: Brain mass Referring Physician: [facility]  BLISS TSANG is an 68 y.o. male.  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 He denies any more episodes either before or after this 1. He is brought to the hospital he is been admitted he has been seen by neurology as well as internal medicine doctors he worked up with CT scans and MRI of his brain that revealed 2 ring-enhancing lesions 1 left parietal 1 cerebellar extensive amount of vasogenic edema around the parietal lesion. Patient is then placed on IV Decadron infectious disease is been consult and patient initiated on both in ID and a metastatic workup. Patient denies any fevers recently denies any known weight loss denies any travel.  Past Medical History  Diagnosis Date  . Medical history non-contributory   . Hypertension     Past Surgical History  Procedure Laterality Date  . No past surgeries      Family History  Problem Relation Age of Onset  . Heart Problems Mother     Social History:  reports that he has been smoking Cigarettes.  He has a 12.5 pack-year smoking history. He has never used smokeless tobacco. He reports that he drinks alcohol. He reports that he uses illicit drugs (Cocaine).  Allergies: No Known Allergies  Medications: I have reviewed the patient's current medications.  Results for orders placed during the hospital encounter of 01/02/13 (from the past 48 hour(s))  ETHANOL     Status: None   Collection Time    01/02/13  2:10 PM      Result Value Range   Alcohol, Ethyl (B) <11  0 - 11 mg/dL   Comment:            LOWEST DETECTABLE LIMIT FOR     SERUM ALCOHOL IS 11 mg/dL     FOR MEDICAL PURPOSES ONLY  PROTIME-INR     Status: None   Collection Time     01/02/13  2:10 PM      Result Value Range   Prothrombin Time 13.1  11.6 - 15.2 seconds   INR 1.01  0.00 - 1.49  APTT     Status: Abnormal   Collection Time    01/02/13  2:10 PM      Result Value Range   aPTT 23 (*) 24 - 37 seconds  CBC     Status: None   Collection Time    01/02/13  2:10 PM      Result Value Range   WBC 4.9  4.0 - 10.5 K/uL   RBC 4.57  4.22 - 5.81 MIL/uL   Hemoglobin 13.5  13.0 - 17.0 g/dL   HCT 41.4  39.0 - 52.0 %   MCV 90.6  78.0 - 100.0 fL   MCH 29.5  26.0 - 34.0 pg   MCHC 32.6  30.0 - 36.0 g/dL   RDW 13.7  11.5 - 15.5 %   Platelets 238  150 - 400 K/uL  DIFFERENTIAL     Status: None   Collection Time    01/02/13  2:10 PM      Result Value Range   Neutrophils Relative % 45  43 - 77 %   Neutro Abs 2.2  1.7 - 7.7 K/uL  Lymphocytes Relative 46  12 - 46 %   Lymphs Abs 2.3  0.7 - 4.0 K/uL   Monocytes Relative 8  3 - 12 %   Monocytes Absolute 0.4  0.1 - 1.0 K/uL   Eosinophils Relative 1  0 - 5 %   Eosinophils Absolute 0.0  0.0 - 0.7 K/uL   Basophils Relative 0  0 - 1 %   Basophils Absolute 0.0  0.0 - 0.1 K/uL  COMPREHENSIVE METABOLIC PANEL     Status: Abnormal   Collection Time    01/02/13  2:10 PM      Result Value Range   Sodium 143  137 - 147 mEq/L   Comment: Please note change in reference range.   Potassium 4.8  3.7 - 5.3 mEq/L   Comment: Please note change in reference range.   Chloride 103  96 - 112 mEq/L   CO2 24  19 - 32 mEq/L   Glucose, Bld 127 (*) 70 - 99 mg/dL   BUN 19  6 - 23 mg/dL   Creatinine, Ser 1.65 (*) 0.50 - 1.35 mg/dL   Calcium 9.3  8.4 - 10.5 mg/dL   Total Protein 8.4 (*) 6.0 - 8.3 g/dL   Albumin 3.7  3.5 - 5.2 g/dL   AST 19  0 - 37 U/L   Comment: HEMOLYSIS AT THIS LEVEL MAY AFFECT RESULT   ALT 11  0 - 53 U/L   Alkaline Phosphatase 50  39 - 117 U/L   Total Bilirubin 0.4  0.3 - 1.2 mg/dL   GFR calc non Af Amer 41 (*) >90 mL/min   GFR calc Af Amer 48 (*) >90 mL/min   Comment: (NOTE)     The eGFR has been calculated using  the CKD EPI equation.     This calculation has not been validated in all clinical situations.     eGFR's persistently <90 mL/min signify possible Chronic Kidney     Disease.  TROPONIN I     Status: None   Collection Time    01/02/13  2:10 PM      Result Value Range   Troponin I <0.30  <0.30 ng/mL   Comment:            Due to the release kinetics of cTnI,     a negative result within the first hours     of the onset of symptoms does not rule out     myocardial infarction with certainty.     If myocardial infarction is still suspected,     repeat the test at appropriate intervals.  POCT I-STAT TROPONIN I     Status: None   Collection Time    01/02/13  2:19 PM      Result Value Range   Troponin i, poc 0.02  0.00 - 0.08 ng/mL   Comment 3            Comment: Due to the release kinetics of cTnI,     a negative result within the first hours     of the onset of symptoms does not rule out     myocardial infarction with certainty.     If myocardial infarction is still suspected,     repeat the test at appropriate intervals.  POCT I-STAT, CHEM 8     Status: Abnormal   Collection Time    01/02/13  2:20 PM      Result Value Range   Sodium 143  137 - 147  mEq/L   Potassium 4.6  3.7 - 5.3 mEq/L   Chloride 105  96 - 112 mEq/L   BUN 22  6 - 23 mg/dL   Creatinine, Ser 1.80 (*) 0.50 - 1.35 mg/dL   Glucose, Bld 127 (*) 70 - 99 mg/dL   Calcium, Ion 1.17  1.13 - 1.30 mmol/L   TCO2 27  0 - 100 mmol/L   Hemoglobin 14.3  13.0 - 17.0 g/dL   HCT 42.0  39.0 - 52.0 %  GLUCOSE, CAPILLARY     Status: Abnormal   Collection Time    01/02/13  2:45 PM      Result Value Range   Glucose-Capillary 112 (*) 70 - 99 mg/dL  URINE RAPID DRUG SCREEN (HOSP PERFORMED)     Status: Abnormal   Collection Time    01/02/13  3:33 PM      Result Value Range   Opiates NONE DETECTED  NONE DETECTED   Cocaine POSITIVE (*) NONE DETECTED   Benzodiazepines NONE DETECTED  NONE DETECTED   Amphetamines NONE DETECTED  NONE  DETECTED   Tetrahydrocannabinol NONE DETECTED  NONE DETECTED   Barbiturates NONE DETECTED  NONE DETECTED   Comment:            DRUG SCREEN FOR MEDICAL PURPOSES     ONLY.  IF CONFIRMATION IS NEEDED     FOR ANY PURPOSE, NOTIFY LAB     WITHIN 5 DAYS.                LOWEST DETECTABLE LIMITS     FOR URINE DRUG SCREEN     Drug Class       Cutoff (ng/mL)     Amphetamine      1000     Barbiturate      200     Benzodiazepine   774     Tricyclics       128     Opiates          300     Cocaine          300     THC              50  URINALYSIS, ROUTINE W REFLEX MICROSCOPIC     Status: Abnormal   Collection Time    01/02/13  3:33 PM      Result Value Range   Color, Urine YELLOW  YELLOW   APPearance HAZY (*) CLEAR   Specific Gravity, Urine 1.027  1.005 - 1.030   pH 6.5  5.0 - 8.0   Glucose, UA NEGATIVE  NEGATIVE mg/dL   Hgb urine dipstick NEGATIVE  NEGATIVE   Bilirubin Urine NEGATIVE  NEGATIVE   Ketones, ur 15 (*) NEGATIVE mg/dL   Protein, ur 30 (*) NEGATIVE mg/dL   Urobilinogen, UA 1.0  0.0 - 1.0 mg/dL   Nitrite NEGATIVE  NEGATIVE   Leukocytes, UA NEGATIVE  NEGATIVE  URINE MICROSCOPIC-ADD ON     Status: Abnormal   Collection Time    01/02/13  3:33 PM      Result Value Range   Squamous Epithelial / LPF RARE  RARE   WBC, UA 0-2  <3 WBC/hpf   Bacteria, UA FEW (*) RARE   Casts HYALINE CASTS (*) NEGATIVE   Urine-Other MUCOUS PRESENT     Comment: AMORPHOUS URATES/PHOSPHATES  CBC     Status: Abnormal   Collection Time    01/02/13  4:00 PM      Result Value  Range   WBC 5.0  4.0 - 10.5 K/uL   RBC 3.97 (*) 4.22 - 5.81 MIL/uL   Hemoglobin 11.9 (*) 13.0 - 17.0 g/dL   Comment: REPEATED TO VERIFY     DELTA CHECK NOTED   HCT 36.2 (*) 39.0 - 52.0 %   MCV 91.2  78.0 - 100.0 fL   MCH 30.0  26.0 - 34.0 pg   MCHC 32.9  30.0 - 36.0 g/dL   RDW 13.9  11.5 - 15.5 %   Platelets 225  150 - 400 K/uL  HEPATIC FUNCTION PANEL     Status: Abnormal   Collection Time    01/02/13  4:00 PM      Result  Value Range   Total Protein 7.4  6.0 - 8.3 g/dL   Albumin 3.4 (*) 3.5 - 5.2 g/dL   AST 15  0 - 37 U/L   ALT 10  0 - 53 U/L   Alkaline Phosphatase 45  39 - 117 U/L   Total Bilirubin 0.3  0.3 - 1.2 mg/dL   Bilirubin, Direct <0.2  0.0 - 0.3 mg/dL   Indirect Bilirubin NOT CALCULATED  0.3 - 0.9 mg/dL  MAGNESIUM     Status: None   Collection Time    01/02/13  4:00 PM      Result Value Range   Magnesium 2.2  1.5 - 2.5 mg/dL  PROTIME-INR     Status: None   Collection Time    01/02/13  4:00 PM      Result Value Range   Prothrombin Time 13.2  11.6 - 15.2 seconds   INR 1.02  0.00 - 1.49  TSH     Status: None   Collection Time    01/02/13  4:00 PM      Result Value Range   TSH 0.896  0.350 - 4.500 uIU/mL   Comment: Performed at [facility]  TROPONIN I     Status: None   Collection Time    01/02/13  4:00 PM      Result Value Range   Troponin I <0.30  <0.30 ng/mL   Comment:            Due to the release kinetics of cTnI,     a negative result within the first hours     of the onset of symptoms does not rule out     myocardial infarction with certainty.     If myocardial infarction is still suspected,     repeat the test at appropriate intervals.  HEMOGLOBIN A1C     Status: Abnormal   Collection Time    01/02/13  4:00 PM      Result Value Range   Hemoglobin A1C 6.1 (*) <5.7 %   Comment: (NOTE)                                                                               According to the ADA Clinical Practice Recommendations for 2011, when     HbA1c is used as a screening test:      >=6.5%   Diagnostic of Diabetes Mellitus               (  if abnormal result is confirmed)     5.7-6.4%   Increased risk of developing Diabetes Mellitus     References:Diagnosis and Classification of Diabetes Mellitus,Diabetes     DGLO,7564,33(IRJJO 1):S62-S69 and Standards of Medical Care in             Diabetes - 2011,Diabetes ACZY,6063,01 (Suppl 1):S11-S61.   Mean Plasma Glucose 128 (*) <117  mg/dL   Comment: Performed at [place]: None   Collection Time    01/02/13  4:00 PM      Result Value Range   Cholesterol 185  0 - 200 mg/dL   Triglycerides 82  <150 mg/dL   HDL 74  >39 mg/dL   Total CHOL/HDL Ratio 2.5     VLDL 16  0 - 40 mg/dL   LDL Cholesterol 95  0 - 99 mg/dL   Comment:            Total Cholesterol/HDL:CHD Risk     Coronary Heart Disease Risk Table                         Men   Women      1/2 Average Risk   3.4   3.3      Average Risk       5.0   4.4      2 X Average Risk   9.6   7.1      3 X Average Risk  23.4   11.0                Use the calculated Patient Ratio     above and the CHD Risk Table     to determine the patient's CHD Risk.                ATP III CLASSIFICATION (LDL):      <100     mg/dL   Optimal      100-129  mg/dL   Near or Above                        Optimal      130-159  mg/dL   Borderline      160-189  mg/dL   High      >190     mg/dL   Very High  MRSA PCR SCREENING     Status: None   Collection Time    01/02/13  5:56 PM      Result Value Range   MRSA by PCR NEGATIVE  NEGATIVE   Comment:            The GeneXpert MRSA Assay (FDA     approved for NASAL specimens     only), is one component of a     comprehensive MRSA colonization     surveillance program. It is not     intended to diagnose MRSA     infection nor to guide or     monitor treatment for     MRSA infections.  TROPONIN I     Status: None   Collection Time    01/02/13  9:30 PM      Result Value Range   Troponin I <0.30  <0.30 ng/mL   Comment:            Due to the release kinetics of cTnI,     a negative result within  the first hours     of the onset of symptoms does not rule out     myocardial infarction with certainty.     If myocardial infarction is still suspected,     repeat the test at appropriate intervals.  TROPONIN I     Status: None   Collection Time    01/03/13  2:55 AM      Result Value Range   Troponin I  <0.30  <0.30 ng/mL   Comment:            Due to the release kinetics of cTnI,     a negative result within the first hours     of the onset of symptoms does not rule out     myocardial infarction with certainty.     If myocardial infarction is still suspected,     repeat the test at appropriate intervals.  COMPREHENSIVE METABOLIC PANEL     Status: Abnormal   Collection Time    01/03/13  2:55 AM      Result Value Range   Sodium 142  137 - 147 mEq/L   Comment: Please note change in reference range.   Potassium 4.9  3.7 - 5.3 mEq/L   Comment: Please note change in reference range.   Chloride 105  96 - 112 mEq/L   CO2 26  19 - 32 mEq/L   Glucose, Bld 105 (*) 70 - 99 mg/dL   BUN 18  6 - 23 mg/dL   Creatinine, Ser 1.42 (*) 0.50 - 1.35 mg/dL   Calcium 8.7  8.4 - 10.5 mg/dL   Total Protein 7.2  6.0 - 8.3 g/dL   Albumin 3.1 (*) 3.5 - 5.2 g/dL   AST 14  0 - 37 U/L   ALT 9  0 - 53 U/L   Alkaline Phosphatase 45  39 - 117 U/L   Total Bilirubin <0.2 (*) 0.3 - 1.2 mg/dL   GFR calc non Af Amer 50 (*) >90 mL/min   GFR calc Af Amer 58 (*) >90 mL/min   Comment: (NOTE)     The eGFR has been calculated using the CKD EPI equation.     This calculation has not been validated in all clinical situations.     eGFR's persistently <90 mL/min signify possible Chronic Kidney     Disease.  CBC     Status: Abnormal   Collection Time    01/03/13  2:55 AM      Result Value Range   WBC 4.8  4.0 - 10.5 K/uL   RBC 4.32  4.22 - 5.81 MIL/uL   Hemoglobin 12.6 (*) 13.0 - 17.0 g/dL   HCT 39.0  39.0 - 52.0 %   MCV 90.3  78.0 - 100.0 fL   MCH 29.2  26.0 - 34.0 pg   MCHC 32.3  30.0 - 36.0 g/dL   RDW 13.5  11.5 - 15.5 %   Platelets 238  150 - 400 K/uL  GLUCOSE, CAPILLARY     Status: Abnormal   Collection Time    01/03/13  6:05 AM      Result Value Range   Glucose-Capillary 123 (*) 70 - 99 mg/dL  GLUCOSE, CAPILLARY     Status: None   Collection Time    01/04/13  7:48 AM      Result Value Range    Glucose-Capillary 84  70 - 99 mg/dL   Comment 1 Notify RN     Comment 2 Documented in Chart  URINE RAPID DRUG SCREEN (HOSP PERFORMED)     Status: Abnormal   Collection Time    01/04/13  8:38 AM      Result Value Range   Opiates NONE DETECTED  NONE DETECTED   Cocaine POSITIVE (*) NONE DETECTED   Benzodiazepines NONE DETECTED  NONE DETECTED   Amphetamines NONE DETECTED  NONE DETECTED   Tetrahydrocannabinol NONE DETECTED  NONE DETECTED   Barbiturates NONE DETECTED  NONE DETECTED   Comment:            DRUG SCREEN FOR MEDICAL PURPOSES     ONLY.  IF CONFIRMATION IS NEEDED     FOR ANY PURPOSE, NOTIFY LAB     WITHIN 5 DAYS.                LOWEST DETECTABLE LIMITS     FOR URINE DRUG SCREEN     Drug Class       Cutoff (ng/mL)     Amphetamine      1000     Barbiturate      200     Benzodiazepine   882     Tricyclics       800     Opiates          300     Cocaine          300     THC              50    Ct Head Wo Contrast  01/02/2013   CLINICAL DATA:  Right-sided weakness.  EXAM: CT HEAD WITHOUT CONTRAST  TECHNIQUE: Contiguous axial images were obtained from the base of the skull through the vertex without intravenous contrast.  COMPARISON:  None.  FINDINGS: Sinuses/Soft tissues: Clear paranasal sinuses and mastoid air cells.  Intracranial: Large area of left parietal hypoattenuation and mass effect. This causes partial effacement of the occipital horn of the left lateral ventricle. 1-2 mm of left-to-right midline shift.  No complicating hemorrhage.  Basal cisterns patent.  Apparent hypoattenuation in the left cerebellar hemisphere on image 6/series 2 is favored to be a artifactual.  No hydrocephalus, intra-axial, or extra-axial fluid collection.  IMPRESSION: 1. Large area of edema within the left parietal lobe. Given the clinical history, this most likely represents a subacute infarct. Given the appearance, differential consideration includes a mass lesion such as a metastasis. There is mass  effect and minimal midline shift. Critical test results telephoned to.Dr. Regenia Skeeter. at the time of interpretation at 2:30 p.m.on 01/02/2013. 2. Probable artifactual hypoattenuation in the left cerebellar hemisphere. If the patient undergoes MRI, recommend attention to this area.   Electronically Signed   By: Abigail Miyamoto M.D.   On: 01/02/2013 14:31   Mr Jeri Cos LK Contrast  01/03/2013   CLINICAL DATA:  Seizure activity. History of drug abuse. Right-sided weakness.  EXAM: MRI HEAD WITHOUT AND WITH CONTRAST  TECHNIQUE: Multiplanar, multiecho pulse sequences of the brain and surrounding structures were obtained without and with intravenous contrast.  CONTRAST:  58m MULTIHANCE GADOBENATE DIMEGLUMINE 529 MG/ML IV SOLN  COMPARISON:  Head CT 01/02/2013  FINDINGS: There is no evidence of ischemic infarction. Within the left parietal lobe, there is an 11 x 13 mm enhancing focus with pronounced regional vasogenic edema. A 2nd enhancing focus is present within the inferior cerebellum on the left measuring 6 mm in diameter. There is minimal edema in that region. Elsewhere, there are a few punctate foci of T2 and FLAIR signal  in the hemispheric white matter not associated with abnormal enhancement and most likely indicative of old small vessel insults. There is a small focus of hemorrhage within the region of edema in the left parietal lobe. This is no larger than 3-4 mm. No hydrocephalus or extra-axial collection. Because of the edema, there is mass-effect on the posterior aspect of the left lateral ventricle. No midline shift. No pituitary mass. Sinuses are clear. No skull or skullbase lesion.  IMPRESSION: 11 x 13 mm enhancing lesion in the left parietal lobe with pronounced regional vasogenic edema. Second enhancing focus in the inferior cerebellum on the left measuring 6 mm in diameter with mild regional edema. The differential diagnosis is metastatic disease versus areas of focal cerebritis. Given the history of drug  abuse, infectious cerebritis is certainly possible. The amount of vasogenic edema associated with the left parietal lesion is extreme, a finding often associated with acute cerebritis. .   Electronically Signed   By: Nelson Chimes M.D.   On: 01/03/2013 19:30    Review of Systems  Constitutional: Negative.   HENT: Negative.   Eyes: Negative.   Respiratory: Negative.   Cardiovascular: Negative.   Gastrointestinal: Negative.   Genitourinary: Negative.   Musculoskeletal: Negative.   Skin: Negative.   Neurological: Positive for dizziness, tingling and sensory change.  Endo/Heme/Allergies: Negative.    Blood pressure 144/92, pulse 53, temperature 97.8 F (36.6 C), temperature source Oral, resp. rate 11, height 6' 4"  (1.93 m), weight 71 kg (156 lb 8.4 oz), SpO2 99.00%. Physical Exam  Constitutional: He appears well-developed and well-nourished.  HENT:  Head: Normocephalic.  Eyes: Pupils are equal, round, and reactive to light.  Cardiovascular: Normal rate.   Respiratory: Effort normal.  GI: Soft.  Musculoskeletal: Normal range of motion.  Neurological: He is alert. GCS eye subscore is 4. GCS verbal subscore is 5. GCS motor subscore is 6.  The patient is awake alert oriented posterior equal extraocular movements are intact with no sign of nystagmus or double vision strength appears to be 5 out of 5 in his upper and lower extremities on the left with a slight right hemiparesis however there does seem to be a slight right hemiapraxia and parietal neglect he does have a slight pronator drift    Assessment/Plan: 68 year old gentleman presents with new diagnoses of multiple brain lesions one left parietal with an extensive amount of vasogenic edema another left cerebellar hemisphere deep with a small amount of vasogenic edema. Due to the multiplicity of lesions certainly raises a question of metastatic disease versus infectious cerebritis these lesions a very small and deep. I would aggressively  pursue both metastatic infectious workup with CT scans of his chest abdomen pelvis as well as infectious workup with blood cultures sedimentation rate C-reactive protein PSA is other laboratory data rule out or help make the diagnoses. If no diagnosis is able to be made we can attempt to biopsy left parietal lesion however due to the size and location this does does have some risk of complication as well as significant risk of having nondiagnostic tissue. Depending on the results of his metastatic workup I would think consideration could be made to putting on him. Antibiotics and repeating an MRI of his brain in 6 weeks to see if we see any interval improvement. We'll discuss this with both medicine and infectious disease as a workup progresses.  Xiara Knisley P 01/04/2013, 12:45 PM

## 2013-01-04 NOTE — Progress Notes (Addendum)
[facility] PROGRESS NOTE  Carlos Beasley KGM:[id number] DOB: 1945-06-17 DOA: 01/02/2013 PCP: No primary provider on file.  Assessment/Plan: Altered mental status, possible CVA, simple partial seizure activity versus mass  -neurology/ID/Dr. Saintclair Halsted (NS) -MRI brain with and without contrast: 11 x 13 mm enhancing lesion in the left parietal lobe with  pronounced regional vasogenic edema. Second enhancing focus in the  inferior cerebellum on the left measuring 6 mm in diameter with mild  regional edema. The differential diagnosis is metastatic disease  versus areas of focal cerebritis. Given the history of drug abuse,  infectious cerebritis is certainly possible. The amount of vasogenic  edema associated with the left parietal lesion is extreme, a finding  often associated with acute cerebritis. -carotids: Right: 1-39% ICA stenosis. Left: 60-79% stenosis at the bulb. 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 Wall thickness was increased increased in a pattern of mild to moderate LVH. Systolic function was moderately to severely reduced. The estimated ejection fraction was in the range of 30% to 35%. Diffuse hypokinesis. Doppler parameters are consistent with restrictive physiology, indicative of decreased left ventricular diastolic compliance and/or increased  left atrial pressure. Doppler parameters are consistent with high ventricular filling pressure. - Aortic valve: Trileaflet; mildly thickened leaflets. No significant regurgitation. - Mitral valve: Mildly thickened leaflets . Mild regurgitation. - Left atrium: The atrium was severely dilated. - Right atrium: The atrium was at the upper limits of normal in size. Central venous pressure: 40mm Hg (est). - Tricuspid valve: Trivial regurgitation. - Pulmonary arteries: Systolic pressure could not be accurately estimated. - Pericardium, extracardiac: There was no pericardial effusion. Impressions: - Mild to moderate LVH with LVEF approximately 30-35%, restrictive diastolic filling pattern, diffuse hypokinesis. Severe left atrial enlargement. MIld mitral and trivial tricuspid regurgitation. Unable to assess PASP.   Antibiotics:    HPI/Subjective:  C/o tongue pain  Objective: Filed Vitals:   01/04/13 0453  BP: 110/83  Pulse: 53  Temp: 97.8 F (36.6 C)  Resp: 11    Intake/Output Summary (Last 24 hours) at 01/04/13 0719 Last data filed at 01/04/13 0600  Gross per 24 hour  Intake   1320 ml  Output    400 ml  Net    920 ml   Filed Weights   01/02/13 1800  Weight: 71 kg (156 lb 8.4 oz)    Exam:   General:  Awake, NAD- VERY poor dentation  Cardiovascular: rrr  Respiratory: clear anterior  Abdomen: +BS, soft  Musculoskeletal: moves all 4 ext   Data Reviewed: Basic Metabolic Panel:  Recent Labs Lab 01/02/13 1410 01/02/13 1420 01/02/13 1600 01/03/13 0255  NA 143 143  --  142  K 4.8 4.6  --  4.9  CL 103 105  --  105  CO2 24  --   --  26  GLUCOSE 127* 127*  --  105*  BUN 19  22  --  18  CREATININE 1.65* 1.80*  --  1.42*  CALCIUM 9.3  --   --  8.7  MG  --   --  2.2  --    Liver Function Tests:  Recent Labs Lab 01/02/13 1410 01/02/13 1600 01/03/13 0255  AST 19 15 14   ALT 11 10 9   ALKPHOS 50 45 45  BILITOT 0.4 0.3 <0.2*  PROT 8.4* 7.4 7.2   ALBUMIN 3.7 3.4* 3.1*   No results found for this basename: LIPASE, AMYLASE,  in the last 168 hours No results found for this basename: AMMONIA,  in the last 168 hours CBC:  Recent Labs Lab 01/02/13 1410 01/02/13 1420 01/02/13 1600 01/03/13 0255  WBC 4.9  --  5.0 4.8  NEUTROABS 2.2  --   --   --   HGB 13.5 14.3 11.9* 12.6*  HCT 41.4 42.0 36.2* 39.0  MCV 90.6  --  91.2 90.3  PLT 238  --  225 238   Cardiac Enzymes:  Recent Labs Lab 01/02/13 1410 01/02/13 1600 01/02/13 2130 01/03/13 0255  TROPONINI <0.30 <0.30 <0.30 <0.30   BNP (last 3 results) No results found for this basename: PROBNP,  in the last 8760 hours CBG:  Recent Labs Lab 01/02/13 1445 01/03/13 0605  GLUCAP 112* 123*    Recent Results (from the past 240 hour(s))  MRSA PCR SCREENING     Status: None   Collection Time    01/02/13  5:56 PM      Result Value Range Status   MRSA by PCR NEGATIVE  NEGATIVE Final   Comment:            The GeneXpert MRSA Assay (FDA     approved for NASAL specimens     only), is one component of a     comprehensive MRSA colonization     surveillance program. It is not     intended to diagnose MRSA     infection nor to guide or     monitor treatment for     MRSA infections.     Studies: Ct Head Wo Contrast  01/02/2013   CLINICAL DATA:  Right-sided weakness.  EXAM: CT HEAD WITHOUT CONTRAST  TECHNIQUE: Contiguous axial images were obtained from the base of the skull through the vertex without intravenous contrast.  COMPARISON:  None.  FINDINGS: Sinuses/Soft tissues: Clear paranasal sinuses and mastoid air cells.  Intracranial: Large area of left parietal hypoattenuation and mass effect. This causes partial effacement of the occipital horn of the left lateral ventricle. 1-2 mm of left-to-right midline shift.  No complicating hemorrhage.  Basal cisterns patent.  Apparent hypoattenuation in the left cerebellar hemisphere on image 6/series 2 is favored to be a artifactual.  No  hydrocephalus, intra-axial, or extra-axial fluid collection.  IMPRESSION: 1. Large area of edema within the left parietal lobe. Given the clinical history, this most likely represents a subacute infarct. Given the appearance, differential consideration includes a mass lesion such as a metastasis. There is mass effect and minimal midline shift. Critical test results telephoned to.Dr. Regenia Skeeter. at the time of interpretation at 2:30 p.m.on 01/02/2013. 2. Probable artifactual hypoattenuation in the left cerebellar hemisphere. If the patient undergoes MRI, recommend attention to this area.   Electronically Signed   By: Abigail Miyamoto M.D.   On: 01/02/2013 14:31   Mr Jeri Cos CB Contrast  01/03/2013   CLINICAL DATA:  Seizure activity. History of drug abuse. Right-sided weakness.  EXAM: MRI HEAD  WITHOUT AND WITH CONTRAST  TECHNIQUE: Multiplanar, multiecho pulse sequences of the brain and surrounding structures were obtained without and with intravenous contrast.  CONTRAST:  56mL MULTIHANCE GADOBENATE DIMEGLUMINE 529 MG/ML IV SOLN  COMPARISON:  Head CT 01/02/2013  FINDINGS: There is no evidence of ischemic infarction. Within the left parietal lobe, there is an 11 x 13 mm enhancing focus with pronounced regional vasogenic edema. A 2nd enhancing focus is present within the inferior cerebellum on the left measuring 6 mm in diameter. There is minimal edema in that region. Elsewhere, there are a few punctate foci of T2 and FLAIR signal in the hemispheric white matter not associated with abnormal enhancement and most likely indicative of old small vessel insults. There is a small focus of hemorrhage within the region of edema in the left parietal lobe. This is no larger than 3-4 mm. No hydrocephalus or extra-axial collection. Because of the edema, there is mass-effect on the posterior aspect of the left lateral ventricle. No midline shift. No pituitary mass. Sinuses are clear. No skull or skullbase lesion.  IMPRESSION: 11 x 13 mm  enhancing lesion in the left parietal lobe with pronounced regional vasogenic edema. Second enhancing focus in the inferior cerebellum on the left measuring 6 mm in diameter with mild regional edema. The differential diagnosis is metastatic disease versus areas of focal cerebritis. Given the history of drug abuse, infectious cerebritis is certainly possible. The amount of vasogenic edema associated with the left parietal lesion is extreme, a finding often associated with acute cerebritis. .   Electronically Signed   By: Nelson Chimes M.D.   On: 01/03/2013 19:30    Scheduled Meds: . amLODipine  5 mg Oral Daily  . aspirin EC  325 mg Oral Daily  . aspirin  300 mg Rectal Daily  . enoxaparin (LOVENOX) injection  40 mg Subcutaneous Q24H  . levETIRAcetam  500 mg Oral BID  . LORazepam  1 mg Intravenous Once   Continuous Infusions: . sodium chloride 50 mL/hr at 01/03/13 2135    Active Problems:   Stroke   Seizure cerebral    Time spent: 35 min    [facility], Carlos Beasley  [facility] Pager [phone]. If 7PM-7AM, please contact night-coverage at [email or web address], password [facility] 01/04/2013, 7:19 AM  LOS: 2 days

## 2013-01-04 NOTE — Consult Note (Addendum)
[facility]  Total days of antibiotics 0               Reason for Consult: brain mass, possible abscess    Referring Physician: vann  Active Problems:   Stroke   Seizure cerebral    HPI: Carlos Beasley is a 68 y.o. 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 Second enhancing focus in the inferior cerebellum on the left measuring 6 mm in diameter with mild regional edema. He has been started on anti-epileptics and steroids. Due to past history of drug abuse and 2 lesions there is strong suspicion for infectious cause. ID and neurosurgery consulted for further management. Remains afebrile, no remote travel overseas.   Past Medical History  Diagnosis Date  . Medical history non-contributory   . Hypertension     Allergies: No Known Allergies   MEDICATIONS: . amLODipine  5 mg Oral Daily  . aspirin EC  325 mg Oral Daily  . aspirin  300 mg Rectal Daily  . dexamethasone  4 mg Intravenous Q6H  . enoxaparin (LOVENOX) injection  40 mg Subcutaneous Q24H  . levETIRAcetam  1,000 mg Oral BID  . LORazepam  1 mg Intravenous Once    History  Substance Use Topics  . Smoking status: Current Every Day Smoker -- 0.50 packs/day for 25 years    Types: Cigarettes  . Smokeless tobacco: Never Used  . Alcohol Use: Yes     Comment: occasionally    Family History  Problem Relation Age of Onset  . Heart Problems Mother     Review of Systems  Constitutional: Negative for fever, chills, diaphoresis, activity change, appetite change, fatigue and unexpected weight change.  HENT: Negative for congestion, sore throat, rhinorrhea, sneezing, trouble swallowing and sinus pressure.    Eyes: Negative for photophobia and visual disturbance.  Respiratory: Negative for cough, chest tightness, shortness of breath, wheezing and stridor.  Cardiovascular: Negative for chest pain, palpitations and leg swelling.  Gastrointestinal: Negative for nausea, vomiting, abdominal pain, diarrhea, constipation, blood in stool, abdominal distention and anal bleeding.  Genitourinary: Negative for dysuria, hematuria, flank pain and difficulty urinating.  Musculoskeletal: Negative for myalgias, back pain, joint swelling, arthralgias and gait problem.  Skin: Negative for color change, pallor, rash and wound.  Neurological: Negative for dizziness, tremors, weakness and light-headedness.  Hematological: Negative for adenopathy. Does not bruise/bleed easily.  Psychiatric/Behavioral: Negative for behavioral problems, confusion, sleep disturbance, dysphoric mood, decreased concentration and agitation.     OBJECTIVE: Temp:  [97.8 F (36.6 C)-98.2 F (36.8 C)] 97.8 F (36.6 C) (01/04 1204) Pulse Rate:  [51-80] 53 (01/04 0453) Resp:  [10-25] 11 (01/04 0453) BP: (110-163)/(83-99) 163/99 mmHg (01/04 1252) SpO2:  [99 %-100 %] 99 % (01/04 0453)  Physical Exam  Constitutional: He is oriented to person, place, and time. He appears well-developed and well-nourished. No distress.  HENT:  Mouth/Throat: Oropharynx is clear and moist. No oropharyngeal exudate. Poor dentition Cardiovascular: Normal rate, regular rhythm and normal heart sounds. Exam reveals no gallop and no friction rub.  No murmur heard.  Pulmonary/Chest: Effort normal and breath sounds normal. No respiratory distress. He has no wheezes.  Abdominal: Soft. Bowel sounds are normal. He  exhibits no distension. There is no tenderness.  Lymphadenopathy:  He has no cervical adenopathy.  Neurological: He is alert and oriented to person, place, and time.  Skin: Skin is warm and dry. No rash noted. No erythema.  Psychiatric: He has a normal mood  and affect. His behavior is normal.    LABS: Results for orders placed during the hospital encounter of 01/02/13 (from the past 48 hour(s))  ETHANOL     Status: None   Collection Time    01/02/13  2:10 PM      Result Value Range   Alcohol, Ethyl (B) <11  0 - 11 mg/dL   Comment:            LOWEST DETECTABLE LIMIT FOR     SERUM ALCOHOL IS 11 mg/dL     FOR MEDICAL PURPOSES ONLY  PROTIME-INR     Status: None   Collection Time    01/02/13  2:10 PM      Result Value Range   Prothrombin Time 13.1  11.6 - 15.2 seconds   INR 1.01  0.00 - 1.49  APTT     Status: Abnormal   Collection Time    01/02/13  2:10 PM      Result Value Range   aPTT 23 (*) 24 - 37 seconds  CBC     Status: None   Collection Time    01/02/13  2:10 PM      Result Value Range   WBC 4.9  4.0 - 10.5 K/uL   RBC 4.57  4.22 - 5.81 MIL/uL   Hemoglobin 13.5  13.0 - 17.0 g/dL   HCT 41.4  39.0 - 52.0 %   MCV 90.6  78.0 - 100.0 fL   MCH 29.5  26.0 - 34.0 pg   MCHC 32.6  30.0 - 36.0 g/dL   RDW 13.7  11.5 - 15.5 %   Platelets 238  150 - 400 K/uL  DIFFERENTIAL     Status: None   Collection Time    01/02/13  2:10 PM      Result Value Range   Neutrophils Relative % 45  43 - 77 %   Neutro Abs 2.2  1.7 - 7.7 K/uL   Lymphocytes Relative 46  12 - 46 %   Lymphs Abs 2.3  0.7 - 4.0 K/uL   Monocytes Relative 8  3 - 12 %   Monocytes Absolute 0.4  0.1 - 1.0 K/uL   Eosinophils Relative 1  0 - 5 %   Eosinophils Absolute 0.0  0.0 - 0.7 K/uL   Basophils Relative 0  0 - 1 %   Basophils Absolute 0.0  0.0 - 0.1 K/uL  COMPREHENSIVE METABOLIC PANEL     Status: Abnormal   Collection Time    01/02/13  2:10 PM      Result Value Range   Sodium 143  137 - 147 mEq/L   Comment: Please note change in reference range.   Potassium 4.8  3.7 - 5.3 mEq/L   Comment: Please note change in reference range.   Chloride 103  96 - 112 mEq/L   CO2 24  19 - 32 mEq/L   Glucose, Bld 127 (*) 70 - 99 mg/dL   BUN 19  6 - 23 mg/dL   Creatinine, Ser 1.65  (*) 0.50 - 1.35 mg/dL   Calcium 9.3  8.4 - 10.5 mg/dL   Total Protein 8.4 (*) 6.0 - 8.3 g/dL   Albumin 3.7  3.5 -  5.2 g/dL   AST 19  0 - 37 U/L   Comment: HEMOLYSIS AT THIS LEVEL MAY AFFECT RESULT   ALT 11  0 - 53 U/L   Alkaline Phosphatase 50  39 - 117 U/L   Total Bilirubin 0.4  0.3 - 1.2 mg/dL   GFR calc non Af Amer 41 (*) >90 mL/min   GFR calc Af Amer 48 (*) >90 mL/min   Comment: (NOTE)     The eGFR has been calculated using the CKD EPI equation.     This calculation has not been validated in all clinical situations.     eGFR's persistently <90 mL/min signify possible Chronic Kidney     Disease.  TROPONIN I     Status: None   Collection Time    01/02/13  2:10 PM      Result Value Range   Troponin I <0.30  <0.30 ng/mL   Comment:            Due to the release kinetics of cTnI,     a negative result within the first hours     of the onset of symptoms does not rule out     myocardial infarction with certainty.     If myocardial infarction is still suspected,     repeat the test at appropriate intervals.  POCT I-STAT TROPONIN I     Status: None   Collection Time    01/02/13  2:19 PM      Result Value Range   Troponin i, poc 0.02  0.00 - 0.08 ng/mL   Comment 3            Comment: Due to the release kinetics of cTnI,     a negative result within the first hours     of the onset of symptoms does not rule out     myocardial infarction with certainty.     If myocardial infarction is still suspected,     repeat the test at appropriate intervals.  POCT I-STAT, CHEM 8     Status: Abnormal   Collection Time    01/02/13  2:20 PM      Result Value Range   Sodium 143  137 - 147 mEq/L   Potassium 4.6  3.7 - 5.3 mEq/L   Chloride 105  96 - 112 mEq/L   BUN 22  6 - 23 mg/dL   Creatinine, Ser 1.80 (*) 0.50 - 1.35 mg/dL   Glucose, Bld 127 (*) 70 - 99 mg/dL   Calcium, Ion 1.17  1.13 - 1.30 mmol/L   TCO2 27  0 - 100 mmol/L   Hemoglobin 14.3  13.0 - 17.0 g/dL   HCT 42.0  39.0 - 52.0 %   GLUCOSE, CAPILLARY     Status: Abnormal   Collection Time    01/02/13  2:45 PM      Result Value Range   Glucose-Capillary 112 (*) 70 - 99 mg/dL  URINE RAPID DRUG SCREEN (HOSP PERFORMED)     Status: Abnormal   Collection Time    01/02/13  3:33 PM      Result Value Range   Opiates NONE DETECTED  NONE DETECTED   Cocaine POSITIVE (*) NONE DETECTED   Benzodiazepines NONE DETECTED  NONE DETECTED   Amphetamines NONE DETECTED  NONE DETECTED   Tetrahydrocannabinol NONE DETECTED  NONE DETECTED   Barbiturates NONE DETECTED  NONE DETECTED   Comment:            DRUG  SCREEN FOR MEDICAL PURPOSES     ONLY.  IF CONFIRMATION IS NEEDED     FOR ANY PURPOSE, NOTIFY LAB     WITHIN 5 DAYS.                LOWEST DETECTABLE LIMITS     FOR URINE DRUG SCREEN     Drug Class       Cutoff (ng/mL)     Amphetamine      1000     Barbiturate      200     Benzodiazepine   361     Tricyclics       443     Opiates          300     Cocaine          300     THC              50  URINALYSIS, ROUTINE W REFLEX MICROSCOPIC     Status: Abnormal   Collection Time    01/02/13  3:33 PM      Result Value Range   Color, Urine YELLOW  YELLOW   APPearance HAZY (*) CLEAR   Specific Gravity, Urine 1.027  1.005 - 1.030   pH 6.5  5.0 - 8.0   Glucose, UA NEGATIVE  NEGATIVE mg/dL   Hgb urine dipstick NEGATIVE  NEGATIVE   Bilirubin Urine NEGATIVE  NEGATIVE   Ketones, ur 15 (*) NEGATIVE mg/dL   Protein, ur 30 (*) NEGATIVE mg/dL   Urobilinogen, UA 1.0  0.0 - 1.0 mg/dL   Nitrite NEGATIVE  NEGATIVE   Leukocytes, UA NEGATIVE  NEGATIVE  URINE MICROSCOPIC-ADD ON     Status: Abnormal   Collection Time    01/02/13  3:33 PM      Result Value Range   Squamous Epithelial / LPF RARE  RARE   WBC, UA 0-2  <3 WBC/hpf   Bacteria, UA FEW (*) RARE   Casts HYALINE CASTS (*) NEGATIVE   Urine-Other MUCOUS PRESENT     Comment: AMORPHOUS URATES/PHOSPHATES  CBC     Status: Abnormal   Collection Time    01/02/13  4:00 PM      Result  Value Range   WBC 5.0  4.0 - 10.5 K/uL   RBC 3.97 (*) 4.22 - 5.81 MIL/uL   Hemoglobin 11.9 (*) 13.0 - 17.0 g/dL   Comment: REPEATED TO VERIFY     DELTA CHECK NOTED   HCT 36.2 (*) 39.0 - 52.0 %   MCV 91.2  78.0 - 100.0 fL   MCH 30.0  26.0 - 34.0 pg   MCHC 32.9  30.0 - 36.0 g/dL   RDW 13.9  11.5 - 15.5 %   Platelets 225  150 - 400 K/uL  HEPATIC FUNCTION PANEL     Status: Abnormal   Collection Time    01/02/13  4:00 PM      Result Value Range   Total Protein 7.4  6.0 - 8.3 g/dL   Albumin 3.4 (*) 3.5 - 5.2 g/dL   AST 15  0 - 37 U/L   ALT 10  0 - 53 U/L   Alkaline Phosphatase 45  39 - 117 U/L   Total Bilirubin 0.3  0.3 - 1.2 mg/dL   Bilirubin, Direct <0.2  0.0 - 0.3 mg/dL   Indirect Bilirubin NOT CALCULATED  0.3 - 0.9 mg/dL  MAGNESIUM     Status: None   Collection Time  01/02/13  4:00 PM      Result Value Range   Magnesium 2.2  1.5 - 2.5 mg/dL  PROTIME-INR     Status: None   Collection Time    01/02/13  4:00 PM      Result Value Range   Prothrombin Time 13.2  11.6 - 15.2 seconds   INR 1.02  0.00 - 1.49  TSH     Status: None   Collection Time    01/02/13  4:00 PM      Result Value Range   TSH 0.896  0.350 - 4.500 uIU/mL   Comment: Performed at [facility]  TROPONIN I     Status: None   Collection Time    01/02/13  4:00 PM      Result Value Range   Troponin I <0.30  <0.30 ng/mL   Comment:            Due to the release kinetics of cTnI,     a negative result within the first hours     of the onset of symptoms does not rule out     myocardial infarction with certainty.     If myocardial infarction is still suspected,     repeat the test at appropriate intervals.  HEMOGLOBIN A1C     Status: Abnormal   Collection Time    01/02/13  4:00 PM      Result Value Range   Hemoglobin A1C 6.1 (*) <5.7 %   Comment: (NOTE)                                                                               According to the ADA Clinical Practice Recommendations for 2011, when      HbA1c is used as a screening test:      >=6.5%   Diagnostic of Diabetes Mellitus               (if abnormal result is confirmed)     5.7-6.4%   Increased risk of developing Diabetes Mellitus     References:Diagnosis and Classification of Diabetes Mellitus,Diabetes     AXKP,5374,82(LMBEM 1):S62-S69 and Standards of Medical Care in             Diabetes - 2011,Diabetes Care,2011,34 (Suppl 1):S11-S61.   Mean Plasma Glucose 128 (*) <117 mg/dL   Comment: Performed at [facility]     Status: None   Collection Time    01/02/13  4:00 PM      Result Value Range   Cholesterol 185  0 - 200 mg/dL   Triglycerides 82  <150 mg/dL   HDL 74  >39 mg/dL   Total CHOL/HDL Ratio 2.5     VLDL 16  0 - 40 mg/dL   LDL Cholesterol 95  0 - 99 mg/dL   Comment:            Total Cholesterol/HDL:CHD Risk     Coronary Heart Disease Risk Table                         Men   Women      1/2 Average Risk  3.4   3.3      Average Risk       5.0   4.4      2 X Average Risk   9.6   7.1      3 X Average Risk  23.4   11.0                Use the calculated Patient Ratio     above and the CHD Risk Table     to determine the patient's CHD Risk.                ATP III CLASSIFICATION (LDL):      <100     mg/dL   Optimal      100-129  mg/dL   Near or Above                        Optimal      130-159  mg/dL   Borderline      160-189  mg/dL   High      >190     mg/dL   Very High  MRSA PCR SCREENING     Status: None   Collection Time    01/02/13  5:56 PM      Result Value Range   MRSA by PCR NEGATIVE  NEGATIVE   Comment:            The GeneXpert MRSA Assay (FDA     approved for NASAL specimens     only), is one component of a     comprehensive MRSA colonization     surveillance program. It is not     intended to diagnose MRSA     infection nor to guide or     monitor treatment for     MRSA infections.  TROPONIN I     Status: None   Collection Time    01/02/13  9:30 PM      Result Value  Range   Troponin I <0.30  <0.30 ng/mL   Comment:            Due to the release kinetics of cTnI,     a negative result within the first hours     of the onset of symptoms does not rule out     myocardial infarction with certainty.     If myocardial infarction is still suspected,     repeat the test at appropriate intervals.  TROPONIN I     Status: None   Collection Time    01/03/13  2:55 AM      Result Value Range   Troponin I <0.30  <0.30 ng/mL   Comment:            Due to the release kinetics of cTnI,     a negative result within the first hours     of the onset of symptoms does not rule out     myocardial infarction with certainty.     If myocardial infarction is still suspected,     repeat the test at appropriate intervals.  COMPREHENSIVE METABOLIC PANEL     Status: Abnormal   Collection Time    01/03/13  2:55 AM      Result Value Range   Sodium 142  137 - 147 mEq/L   Comment: Please note change in reference range.   Potassium 4.9  3.7 - 5.3 mEq/L   Comment: Please note change in reference  range.   Chloride 105  96 - 112 mEq/L   CO2 26  19 - 32 mEq/L   Glucose, Bld 105 (*) 70 - 99 mg/dL   BUN 18  6 - 23 mg/dL   Creatinine, Ser 1.42 (*) 0.50 - 1.35 mg/dL   Calcium 8.7  8.4 - 10.5 mg/dL   Total Protein 7.2  6.0 - 8.3 g/dL   Albumin 3.1 (*) 3.5 - 5.2 g/dL   AST 14  0 - 37 U/L   ALT 9  0 - 53 U/L   Alkaline Phosphatase 45  39 - 117 U/L   Total Bilirubin <0.2 (*) 0.3 - 1.2 mg/dL   GFR calc non Af Amer 50 (*) >90 mL/min   GFR calc Af Amer 58 (*) >90 mL/min   Comment: (NOTE)     The eGFR has been calculated using the CKD EPI equation.     This calculation has not been validated in all clinical situations.     eGFR's persistently <90 mL/min signify possible Chronic Kidney     Disease.  CBC     Status: Abnormal   Collection Time    01/03/13  2:55 AM      Result Value Range   WBC 4.8  4.0 - 10.5 K/uL   RBC 4.32  4.22 - 5.81 MIL/uL   Hemoglobin 12.6 (*) 13.0 - 17.0 g/dL    HCT 39.0  39.0 - 52.0 %   MCV 90.3  78.0 - 100.0 fL   MCH 29.2  26.0 - 34.0 pg   MCHC 32.3  30.0 - 36.0 g/dL   RDW 13.5  11.5 - 15.5 %   Platelets 238  150 - 400 K/uL  GLUCOSE, CAPILLARY     Status: Abnormal   Collection Time    01/03/13  6:05 AM      Result Value Range   Glucose-Capillary 123 (*) 70 - 99 mg/dL  GLUCOSE, CAPILLARY     Status: None   Collection Time    01/04/13  7:48 AM      Result Value Range   Glucose-Capillary 84  70 - 99 mg/dL   Comment 1 Notify RN     Comment 2 Documented in Chart    URINE RAPID DRUG SCREEN (HOSP PERFORMED)     Status: Abnormal   Collection Time    01/04/13  8:38 AM      Result Value Range   Opiates NONE DETECTED  NONE DETECTED   Cocaine POSITIVE (*) NONE DETECTED   Benzodiazepines NONE DETECTED  NONE DETECTED   Amphetamines NONE DETECTED  NONE DETECTED   Tetrahydrocannabinol NONE DETECTED  NONE DETECTED   Barbiturates NONE DETECTED  NONE DETECTED   Comment:            DRUG SCREEN FOR MEDICAL PURPOSES     ONLY.  IF CONFIRMATION IS NEEDED     FOR ANY PURPOSE, NOTIFY LAB     WITHIN 5 DAYS.                LOWEST DETECTABLE LIMITS     FOR URINE DRUG SCREEN     Drug Class       Cutoff (ng/mL)     Amphetamine      1000     Barbiturate      200     Benzodiazepine   537     Tricyclics       482     Opiates  300     Cocaine          300     THC              50    MICRO: 1/4 blood cx PENDING  IMAGING: Ct Head Wo Contrast  01/02/2013   CLINICAL DATA:  Right-sided weakness.  EXAM: CT HEAD WITHOUT CONTRAST  TECHNIQUE: Contiguous axial images were obtained from the base of the skull through the vertex without intravenous contrast.  COMPARISON:  None.  FINDINGS: Sinuses/Soft tissues: Clear paranasal sinuses and mastoid air cells.  Intracranial: Large area of left parietal hypoattenuation and mass effect. This causes partial effacement of the occipital horn of the left lateral ventricle. 1-2 mm of left-to-right midline shift.  No  complicating hemorrhage.  Basal cisterns patent.  Apparent hypoattenuation in the left cerebellar hemisphere on image 6/series 2 is favored to be a artifactual.  No hydrocephalus, intra-axial, or extra-axial fluid collection.  IMPRESSION: 1. Large area of edema within the left parietal lobe. Given the clinical history, this most likely represents a subacute infarct. Given the appearance, differential consideration includes a mass lesion such as a metastasis. There is mass effect and minimal midline shift. Critical test results telephoned to.Dr. Regenia Skeeter. at the time of interpretation at 2:30 p.m.on 01/02/2013. 2. Probable artifactual hypoattenuation in the left cerebellar hemisphere. If the patient undergoes MRI, recommend attention to this area.   Electronically Signed   By: Abigail Miyamoto M.D.   On: 01/02/2013 14:31   Mr Jeri Cos NM Contrast  01/03/2013   CLINICAL DATA:  Seizure activity. History of drug abuse. Right-sided weakness.  EXAM: MRI HEAD WITHOUT AND WITH CONTRAST  TECHNIQUE: Multiplanar, multiecho pulse sequences of the brain and surrounding structures were obtained without and with intravenous contrast.  CONTRAST:  81m MULTIHANCE GADOBENATE DIMEGLUMINE 529 MG/ML IV SOLN  COMPARISON:  Head CT 01/02/2013  FINDINGS: There is no evidence of ischemic infarction. Within the left parietal lobe, there is an 11 x 13 mm enhancing focus with pronounced regional vasogenic edema. A 2nd enhancing focus is present within the inferior cerebellum on the left measuring 6 mm in diameter. There is minimal edema in that region. Elsewhere, there are a few punctate foci of T2 and FLAIR signal in the hemispheric white matter not associated with abnormal enhancement and most likely indicative of old small vessel insults. There is a small focus of hemorrhage within the region of edema in the left parietal lobe. This is no larger than 3-4 mm. No hydrocephalus or extra-axial collection. Because of the edema, there is mass-effect  on the posterior aspect of the left lateral ventricle. No midline shift. No pituitary mass. Sinuses are clear. No skull or skullbase lesion.  IMPRESSION: 11 x 13 mm enhancing lesion in the left parietal lobe with pronounced regional vasogenic edema. Second enhancing focus in the inferior cerebellum on the left measuring 6 mm in diameter with mild regional edema. The differential diagnosis is metastatic disease versus areas of focal cerebritis. Given the history of drug abuse, infectious cerebritis is certainly possible. The amount of vasogenic edema associated with the left parietal lesion is extreme, a finding often associated with acute cerebritis. .   Electronically Signed   By: MNelson ChimesM.D.   On: 01/03/2013 19:30     Assessment/Plan:   68yo M with new onset seizure found to have  11 x 13 mm enhancing lesion in the left parietal lobe with regional vasogenic edema causing mass-effect on the  posterior aspect of the left lateral ventricle and a Second 45m lesion  in the inferior left cerebellum concerning for brain abscess/cerebritis vs. Metastatic malignancy disease  - check blood cx x 2 today and tomorrow - do chest/abd/pelvis CT - check hiv, and quantiferon - if blood cultures are positive, would also get TEE to see if patient may have valvular vegetations that embolized - would hold off on empiric antibiotics until testing can be done since patient is stable. Likely to start regimen tomorrow. - pending work up, will discuss with neurosurgery need for sampling tissue of left parietal lesion  Maley Venezia B. [facility] [phone]

## 2013-01-04 NOTE — Progress Notes (Addendum)
SLP Cancellation Note  Patient Details Name: Carlos Beasley MRN: [id number] DOB: Dec 03, 1945   Cancelled treatment:  ST to f/u on 01/05/13 for diet tolerance.  RN contacted by phone with no concerns with current diet.   Sharman Crate M.S., CCC-SLP [phone] [facility] 01/04/2013, 4:05 PM

## 2013-01-05 DIAGNOSIS — J984 Other disorders of lung: Secondary | ICD-10-CM

## 2013-01-05 MED ORDER — AMLODIPINE BESYLATE 10 MG PO TABS
10.0000 mg | ORAL_TABLET | Freq: Every day | ORAL | Status: DC
  Administered 2013-01-06 – 2013-01-09 (×4): 10 mg via ORAL
  Filled 2013-01-05 (×4): qty 1

## 2013-01-05 MED ORDER — PANTOPRAZOLE SODIUM 40 MG PO TBEC
40.0000 mg | DELAYED_RELEASE_TABLET | Freq: Every day | ORAL | Status: DC
  Administered 2013-01-05 – 2013-01-09 (×5): 40 mg via ORAL
  Filled 2013-01-05 (×5): qty 1

## 2013-01-05 MED ORDER — NICOTINE 21 MG/24HR TD PT24
21.0000 mg | MEDICATED_PATCH | Freq: Every day | TRANSDERMAL | Status: DC
  Administered 2013-01-05 – 2013-01-09 (×4): 21 mg via TRANSDERMAL
  Filled 2013-01-05 (×5): qty 1

## 2013-01-05 MED ORDER — HYDRALAZINE HCL 25 MG PO TABS
25.0000 mg | ORAL_TABLET | Freq: Three times a day (TID) | ORAL | Status: DC
  Administered 2013-01-05 – 2013-01-09 (×11): 25 mg via ORAL
  Filled 2013-01-05 (×16): qty 1

## 2013-01-05 MED ORDER — LISINOPRIL 5 MG PO TABS
5.0000 mg | ORAL_TABLET | Freq: Every day | ORAL | Status: DC
  Administered 2013-01-05 – 2013-01-09 (×5): 5 mg via ORAL
  Filled 2013-01-05 (×5): qty 1

## 2013-01-05 NOTE — Progress Notes (Signed)
[facility] for Infectious Disease    Date of Admission:  01/02/2013   Total days of antibiotics 0           ID: Carlos Beasley is a 68 y.o. 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 amLODipine  5 mg Oral Daily  . antiseptic oral rinse  15 mL Mouth Rinse q12n4p  . aspirin EC  325 mg Oral Daily  . aspirin  300 mg Rectal Daily  . chlorhexidine  15 mL Mouth Rinse BID  . dexamethasone  4 mg Intravenous Q6H  . enoxaparin (LOVENOX) injection  40 mg Subcutaneous Q24H  . levETIRAcetam  1,000 mg Oral BID  . LORazepam  1 mg Intravenous Once    Objective: Vital signs in last 24 hours: Temp:  [97.3 F (36.3 C)-98.3 F (36.8 C)] 97.7 F (36.5 C) (01/05 0833) Pulse Rate:  [62-67] 62 (01/05 0833) Resp:  [11-18] 11 (01/05 0833) BP: (140-163)/(74-118) 151/83 mmHg (01/05 0435) SpO2:  [94 %-96 %] 95 % (01/05 0833) Weight:  [160 lb 15 oz (73 kg)] 160 lb 15 oz (73 kg) (01/05 0435)  Physical Exam  Constitutional: He is oriented to person, place, and time. He appears well-developed and well-nourished. No distress.  HENT:  Mouth/Throat: Oropharynx is clear and moist. No oropharyngeal exudate. Poor dentition Cardiovascular: Normal rate, regular rhythm and normal heart sounds. Exam reveals no gallop and no friction rub.  No murmur heard.  Pulmonary/Chest: Effort normal and breath sounds normal. No respiratory distress. He has no wheezes.  Abdominal: Soft. Bowel sounds are normal. He exhibits no distension. There is no tenderness.  Lymphadenopathy:  He has no cervical adenopathy.  Neurological: He is alert and oriented to person, place, and time.  Skin: Skin is warm and dry. No rash noted. No erythema.  Psychiatric: He has a normal  mood and affect. His behavior is normal.   Lab Results  Recent Labs  01/02/13 1410 01/02/13 1420 01/02/13 1600 01/03/13 0255  WBC 4.9  --  5.0 4.8  HGB 13.5 14.3 11.9* 12.6*  HCT 41.4 42.0 36.2* 39.0  NA 143 143  --  142  K 4.8 4.6  --  4.9  CL 103 105  --  105  CO2 24  --   --  26  BUN 19 22  --  18  CREATININE 1.65* 1.80*  --  1.42*   Liver Panel  Recent Labs  01/02/13 1410 01/02/13 1600 01/03/13 0255  PROT 8.4* 7.4 7.2  ALBUMIN 3.7 3.4* 3.1*  AST 19 15 14   ALT 11 10 9   ALKPHOS 50 45 45  BILITOT 0.4 0.3 <0.2*  BILIDIR  --  <0.2  --   IBILI  --  NOT CALCULATED  --    Sedimentation Rate  Recent Labs  01/04/13 1100  ESRSEDRATE 32*   C-Reactive Protein  Recent Labs  01/04/13 1100  CRP <0.5*    Microbiology:  Studies/Results: Ct Chest W Contrast  01/04/2013   CLINICAL DATA:  Brain lesion on comparison MRI. Concern for metastatic disease.  EXAM: CT CHEST, ABDOMEN, AND PELVIS WITH CONTRAST  TECHNIQUE: Multidetector CT imaging of the chest, abdomen and pelvis was performed following the standard protocol during bolus administration of intravenous contrast.  CONTRAST:  162mL OMNIPAQUE IOHEXOL 300 MG/ML  SOLN  COMPARISON:  US SCROTUM dated 09/08/2007; MR HEAD WO/W CM dated 01/03/2013  FINDINGS: CT CHEST FINDINGS  There is little body fat in the patient's thorax . No axillary lymphadenopathy. Potential right supraclavicular lymph node measuring 16 mm (image number 1, series 5). There is enlarged right lower paratracheal lymph node measuring 15 mm short axis. Left lower paratracheal lymph node measures 12 mm. There is a large subcarinal lymph node measuring 17 mm short axis. Prominent right hilar lymph node measures 10 mm (image 25, series 2).  Review of the lung parenchyma demonstrates a spiculated nodule at the superior aspect of the right upper lobe measuring 7 x 7 mm (image 11, is series 6). This is also seen on image number 2 of series 4. No additional suspicious  pulmonary nodules. Central airways are normal.  CT ABDOMEN AND PELVIS FINDINGS  Low-density lesion the posterior right hepatic lobe has simple fluid attenuation consistent with benign cyst. 3 mm hypodense lesion in the central left hepatic lobe (image 52) is too small to characterize. The gallbladder, pancreas, spleen, adrenal glands, and kidneys are normal.  Stomach, small bowel, and colon are unremarkable.  Abdominal aorta is normal caliber. No retroperitoneal periportal lymphadenopathy.  No free fluid the pelvis. The prostate gland and bladder normal. No pelvic lymphadenopathy. No aggressive osseous lesion.  IMPRESSION: 1. Mediastinal lymphadenopathy is concerning for metastatic adenopathy. 2. Small right upper lobe spiculated nodule may represent primary bronchogenic carcinoma. 3. Potential right supraclavicular enlarged lymph node. Ultrasound evaluation could delineate this lymph node. 4. Low-density lesions in the liver likely represent benign cysts. The smaller cyst is too small to characterize. 5. Patient may ultimately benefit from an outpatient FDG PET-CT staging exam.   Electronically Signed   By: Suzy Bouchard M.D.   On: 01/04/2013 15:20   Mr Jeri Cos BJ Contrast  01/03/2013   CLINICAL DATA:  Seizure activity. History of drug abuse. Right-sided weakness.  EXAM: MRI HEAD WITHOUT AND WITH CONTRAST  TECHNIQUE: Multiplanar, multiecho pulse sequences of the brain and surrounding structures were obtained without and with intravenous contrast.  CONTRAST:  59mL MULTIHANCE GADOBENATE DIMEGLUMINE 529 MG/ML IV SOLN  COMPARISON:  Head CT 01/02/2013  FINDINGS: There is no evidence of ischemic infarction. Within the left parietal lobe, there is an 11 x 13 mm enhancing focus with pronounced regional vasogenic edema. A 2nd enhancing focus is present within the inferior cerebellum on the left measuring 6 mm in diameter. There is minimal edema in that region. Elsewhere, there are a few punctate foci of T2 and FLAIR  signal in the hemispheric white matter not associated with abnormal enhancement and most likely indicative of old small vessel insults. There is a small focus of hemorrhage within the region of edema in the left parietal lobe. This is no larger than 3-4 mm. No hydrocephalus or extra-axial collection. Because of the edema, there is mass-effect on the posterior aspect of the left lateral ventricle. No midline shift. No pituitary mass. Sinuses are clear. No skull or skullbase lesion.  IMPRESSION: 11 x 13 mm enhancing lesion in the left parietal lobe with pronounced regional vasogenic edema. Second enhancing focus in the inferior cerebellum on the left measuring 6 mm in diameter with mild regional edema. The differential diagnosis is metastatic disease versus areas of focal cerebritis. Given the history of drug abuse, infectious cerebritis is certainly possible. The amount of vasogenic edema associated with the left parietal lesion is extreme, a  finding often associated with acute cerebritis. .   Electronically Signed   By: Nelson Chimes M.D.   On: 01/03/2013 19:30   Ct Abdomen Pelvis W Contrast  01/04/2013   CLINICAL DATA:  Brain lesion on comparison MRI. Concern for metastatic disease.  EXAM: CT CHEST, ABDOMEN, AND PELVIS WITH CONTRAST  TECHNIQUE: Multidetector CT imaging of the chest, abdomen and pelvis was performed following the standard protocol during bolus administration of intravenous contrast.  CONTRAST:  126mL OMNIPAQUE IOHEXOL 300 MG/ML  SOLN  COMPARISON:  US SCROTUM dated 09/08/2007; MR HEAD WO/W CM dated 01/03/2013  FINDINGS: CT CHEST FINDINGS  There is little body fat in the patient's thorax . No axillary lymphadenopathy. Potential right supraclavicular lymph node measuring 16 mm (image number 1, series 5). There is enlarged right lower paratracheal lymph node measuring 15 mm short axis. Left lower paratracheal lymph node measures 12 mm. There is a large subcarinal lymph node measuring 17 mm short axis.  Prominent right hilar lymph node measures 10 mm (image 25, series 2).  Review of the lung parenchyma demonstrates a spiculated nodule at the superior aspect of the right upper lobe measuring 7 x 7 mm (image 11, is series 6). This is also seen on image number 2 of series 4. No additional suspicious pulmonary nodules. Central airways are normal.  CT ABDOMEN AND PELVIS FINDINGS  Low-density lesion the posterior right hepatic lobe has simple fluid attenuation consistent with benign cyst. 3 mm hypodense lesion in the central left hepatic lobe (image 52) is too small to characterize. The gallbladder, pancreas, spleen, adrenal glands, and kidneys are normal.  Stomach, small bowel, and colon are unremarkable.  Abdominal aorta is normal caliber. No retroperitoneal periportal lymphadenopathy.  No free fluid the pelvis. The prostate gland and bladder normal. No pelvic lymphadenopathy. No aggressive osseous lesion.  IMPRESSION: 1. Mediastinal lymphadenopathy is concerning for metastatic adenopathy. 2. Small right upper lobe spiculated nodule may represent primary bronchogenic carcinoma. 3. Potential right supraclavicular enlarged lymph node. Ultrasound evaluation could delineate this lymph node. 4. Low-density lesions in the liver likely represent benign cysts. The smaller cyst is too small to characterize. 5. Patient may ultimately benefit from an outpatient FDG PET-CT staging exam.   Electronically Signed   By: Suzy Bouchard M.D.   On: 01/04/2013 15:20     Assessment/Plan: 69 yo M with new onset seizure 2/2 brain mass concern for brain abscess vs. Metastatic lung disease  - continue to hold off on antibiotics, favor non-infectious process but still need tissue sent for culture - recommend biopsy of lung lesion +/- lymphadenopathy to send for path as well as bacterial, fungal and mycobacterial culture.  Baxter Flattery [facility] Cell: [phone] Pager: [phone]  01/05/2013,  10:41 AM

## 2013-01-05 NOTE — Evaluation (Signed)
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 Pt with residual R UE/LE weakness.  Clinical Impression  Pt with noted balance impairment and R sided weakness however suspect pt to progress well enough to return home with father and HHPT. Acute PT to follow to progress mobility.    PT Assessment  Patient needs continued PT services    Follow Up Recommendations  Home health PT;Supervision/Assistance - 24 hour    Does the patient have the potential to tolerate intense rehabilitation      Barriers to Discharge        Equipment Recommendations  None recommended by PT    Recommendations for Other Services     Frequency Min 4X/week    Precautions / Restrictions Precautions Precautions: Fall Restrictions Weight Bearing Restrictions: No   Pertinent Vitals/Pain Denies pain      Mobility  Bed Mobility Bed Mobility: Supine to Sit;Sitting - Scoot to Edge of Bed Supine to Sit: 5: Supervision;HOB flat Sitting - Scoot to Edge of Bed: 5: Supervision Details for Bed Mobility Assistance: pt with safe technique Transfers Transfers: Sit to Stand;Stand to Sit Sit to Stand: 4: Min guard;With upper extremity assist;From bed Stand to Sit: 4: Min guard;With upper extremity assist;To chair/3-in-1 Details for Transfer Assistance: pt rocked to gain momentum to achieve anterior weight shift Ambulation/Gait Ambulation/Gait Assistance: 4: Min assist Ambulation Distance (Feet): 150 Feet Assistive device: None Ambulation/Gait Assistance Details: pt with noted R sided limp, mildly unsteady but no episodes of LOB.  Gait Pattern: Step-to pattern;Decreased stride length;Narrow base of support;Trunk flexed Gait velocity: slow Stairs: No Modified Rankin (Stroke  Patients Only) Pre-Morbid Rankin Score: No symptoms Modified Rankin: Moderate disability    Exercises     PT Diagnosis: Difficulty walking  PT Problem List: Decreased strength;Decreased activity tolerance;Decreased balance;Decreased mobility PT Treatment Interventions: DME instruction;Gait training;Stair training;Functional mobility training;Therapeutic activities;Therapeutic exercise;Neuromuscular re-education;Balance training     PT Goals(Current goals can be found in the care plan section) Acute Rehab PT Goals Patient Stated Goal: home PT Goal Formulation: With patient Time For Goal Achievement: 01/19/13 Potential to Achieve Goals: Good Additional Goals Additional Goal #1: Pt to score >19 on DGI to indicate minimal falls risk.  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 Pt with residual R UE/LE weakness.       Prior [place] expects to be discharged to:: Private residence Living Arrangements: Parent (father) Available Help at Discharge: Family;Available 24 hours/day Type of Home: House Home Access: Stairs to enter [employer] of Steps: 4 Entrance Stairs-Rails: Right Home Layout: One level Home Equipment: Cane - single point Prior Function Level of Independence: Independent Communication Communication: No difficulties Dominant Hand: Right    Cognition  Cognition Arousal/Alertness: Awake/alert Behavior During Therapy: WFL for tasks assessed/performed Overall Cognitive Status: Within Functional Limits for tasks assessed    Extremity/Trunk Assessment Upper Extremity Assessment Upper Extremity Assessment: RUE deficits/detail RUE Deficits / Details: grossly 3+/5 RUE Sensation: decreased light touch Lower Extremity Assessment Lower Extremity Assessment: RLE deficits/detail RLE Deficits / Details: grossly 4-/5 RLE Sensation:  decreased light touch Cervical / Trunk Assessment Cervical / Trunk Assessment: Normal   Balance Balance Balance Assessed: Yes Static Standing  Balance Static Standing - Balance Support: No upper extremity supported;During functional activity (stood to use urinal) Static Standing - Level of Assistance: 5: Stand by assistance Static Standing - Comment/# of Minutes: 2 min  End of Session PT - End of Session Equipment Utilized During Treatment: Gait belt Activity Tolerance: Patient tolerated treatment well Patient left: in chair;with call bell/phone within reach Nurse Communication: Mobility status  GP     Kingsley Callander 01/05/2013, 8:29 AM   Kittie Plater, PT, DPT Pager #: [phone] Office #: [phone]

## 2013-01-05 NOTE — Progress Notes (Signed)
NEURO HOSPITALIST PROGRESS NOTE   SUBJECTIVE:                                                                                                                        Out of bed, very comfortable and eating breakfast. No further seizures noted. On keppra 1,000 mg BID without noticeable side effects. Decadron 4 mg every 6 hours. CT chest with contrast:mediastinal lymphadenopathy, concerning for metastatic adenopathy. Small right upper lobe spiculated nodule may represent primary bronchogenic carcinoma. Potential right supraclavicular enlarged lymph node CT abdomen and pelvis unrevealing. ID and neurosurgery recommendations noted and appreciated.  OBJECTIVE:                                                                                                                           Vital signs in last 24 hours: Temp:  [97.3 F (36.3 C)-98.3 F (36.8 C)] 97.7 F (36.5 C) (01/05 0833) Pulse Rate:  [67] 67 (01/04 2049) Resp:  [12-18] 15 (01/05 0435) BP: (140-163)/(74-118) 151/83 mmHg (01/05 0435) SpO2:  [94 %-96 %] 94 % (01/04 2320) Weight:  [73 kg (160 lb 15 oz)] 73 kg (160 lb 15 oz) (01/05 0435)  Intake/Output from previous day: 01/04 0701 - 01/05 0700 In: 1680 [P.O.:480; I.V.:1200] Out: 1875 [Urine:1875] Intake/Output this shift:   Nutritional status: Dysphagia  Past Medical History  Diagnosis Date  . Medical history non-contributory   . Hypertension    Neurologic Exam:  Mental status: alert, awake, oriented x 4. Comprehension, naming, repetition intact. No dysarthria or dysphasia.  Cranial Nerves:  II: Discs flat bilaterally; Visual fields grossly normal, pupils equal, round, reactive to light and accommodation  III,IV, VI: ptosis not present, extra-ocular motions intact bilaterally  V,VII: smile symmetric, facial light touch sensation normal bilaterally  VIII: hearing normal bilaterally  IX,X: gag reflex present  XI: bilateral shoulder  shrug  XII: midline tongue extension without atrophy or fasciculations  Motor:  Significant for mild right HP.  Tone and bulk:normal tone throughout; no atrophy noted  Sensory: Pinprick and light touch intact throughout, bilaterally  Deep Tendon Reflexes:  Right: Upper Extremity Left: Upper extremity  biceps (C-5 to C-6) 2/4 biceps (C-5 to C-6) 2/4  tricep (C7) 2/4 triceps (C7) 2/4  Brachioradialis (C6) 2/4 Brachioradialis (C6) 2/4  Lower Extremity Lower Extremity  quadriceps (L-2 to L-4) 1/4 quadriceps (L-2 to L-4) 1/4  Achilles (S1) 1/4 Achilles (S1) 1/4  Plantars:  Mute bilaterally  Cerebellar:  normal finger-to-nose on the left, normal heel-to-shin test on the left  Gait: not tested.    Lab Results: Lab Results  Component Value Date/Time   CHOL 185 01/02/2013  4:00 PM   Lipid Panel  Recent Labs  01/02/13 1600  CHOL 185  TRIG 82  HDL 74  CHOLHDL 2.5  VLDL 16  LDLCALC 95    Studies/Results: Ct Chest W Contrast  01/04/2013   CLINICAL DATA:  Brain lesion on comparison MRI. Concern for metastatic disease.  EXAM: CT CHEST, ABDOMEN, AND PELVIS WITH CONTRAST  TECHNIQUE: Multidetector CT imaging of the chest, abdomen and pelvis was performed following the standard protocol during bolus administration of intravenous contrast.  CONTRAST:  176mL OMNIPAQUE IOHEXOL 300 MG/ML  SOLN  COMPARISON:  US SCROTUM dated 09/08/2007; MR HEAD WO/W CM dated 01/03/2013  FINDINGS: CT CHEST FINDINGS  There is little body fat in the patient's thorax . No axillary lymphadenopathy. Potential right supraclavicular lymph node measuring 16 mm (image number 1, series 5). There is enlarged right lower paratracheal lymph node measuring 15 mm short axis. Left lower paratracheal lymph node measures 12 mm. There is a large subcarinal lymph node measuring 17 mm short axis. Prominent right hilar lymph node measures 10 mm (image 25, series 2).  Review of the lung parenchyma demonstrates a spiculated nodule at the superior  aspect of the right upper lobe measuring 7 x 7 mm (image 11, is series 6). This is also seen on image number 2 of series 4. No additional suspicious pulmonary nodules. Central airways are normal.  CT ABDOMEN AND PELVIS FINDINGS  Low-density lesion the posterior right hepatic lobe has simple fluid attenuation consistent with benign cyst. 3 mm hypodense lesion in the central left hepatic lobe (image 52) is too small to characterize. The gallbladder, pancreas, spleen, adrenal glands, and kidneys are normal.  Stomach, small bowel, and colon are unremarkable.  Abdominal aorta is normal caliber. No retroperitoneal periportal lymphadenopathy.  No free fluid the pelvis. The prostate gland and bladder normal. No pelvic lymphadenopathy. No aggressive osseous lesion.  IMPRESSION: 1. Mediastinal lymphadenopathy is concerning for metastatic adenopathy. 2. Small right upper lobe spiculated nodule may represent primary bronchogenic carcinoma. 3. Potential right supraclavicular enlarged lymph node. Ultrasound evaluation could delineate this lymph node. 4. Low-density lesions in the liver likely represent benign cysts. The smaller cyst is too small to characterize. 5. Patient may ultimately benefit from an outpatient FDG PET-CT staging exam.   Electronically Signed   By: Suzy Bouchard M.D.   On: 01/04/2013 15:20   Mr Jeri Cos GH Contrast  01/03/2013   CLINICAL DATA:  Seizure activity. History of drug abuse. Right-sided weakness.  EXAM: MRI HEAD WITHOUT AND WITH CONTRAST  TECHNIQUE: Multiplanar, multiecho pulse sequences of the brain and surrounding structures were obtained without and with intravenous contrast.  CONTRAST:  34mL MULTIHANCE GADOBENATE DIMEGLUMINE 529 MG/ML IV SOLN  COMPARISON:  Head CT 01/02/2013  FINDINGS: There is no evidence of ischemic infarction. Within the left parietal lobe, there is an 11 x 13 mm enhancing focus with pronounced regional vasogenic edema. A 2nd enhancing focus is present within the inferior  cerebellum on the left measuring 6 mm in diameter. There is minimal edema in that region. Elsewhere, there are a few punctate foci  of T2 and FLAIR signal in the hemispheric white matter not associated with abnormal enhancement and most likely indicative of old small vessel insults. There is a small focus of hemorrhage within the region of edema in the left parietal lobe. This is no larger than 3-4 mm. No hydrocephalus or extra-axial collection. Because of the edema, there is mass-effect on the posterior aspect of the left lateral ventricle. No midline shift. No pituitary mass. Sinuses are clear. No skull or skullbase lesion.  IMPRESSION: 11 x 13 mm enhancing lesion in the left parietal lobe with pronounced regional vasogenic edema. Second enhancing focus in the inferior cerebellum on the left measuring 6 mm in diameter with mild regional edema. The differential diagnosis is metastatic disease versus areas of focal cerebritis. Given the history of drug abuse, infectious cerebritis is certainly possible. The amount of vasogenic edema associated with the left parietal lesion is extreme, a finding often associated with acute cerebritis. .   Electronically Signed   By: Nelson Chimes M.D.   On: 01/03/2013 19:30   Ct Abdomen Pelvis W Contrast  01/04/2013   CLINICAL DATA:  Brain lesion on comparison MRI. Concern for metastatic disease.  EXAM: CT CHEST, ABDOMEN, AND PELVIS WITH CONTRAST  TECHNIQUE: Multidetector CT imaging of the chest, abdomen and pelvis was performed following the standard protocol during bolus administration of intravenous contrast.  CONTRAST:  166mL OMNIPAQUE IOHEXOL 300 MG/ML  SOLN  COMPARISON:  US SCROTUM dated 09/08/2007; MR HEAD WO/W CM dated 01/03/2013  FINDINGS: CT CHEST FINDINGS  There is little body fat in the patient's thorax . No axillary lymphadenopathy. Potential right supraclavicular lymph node measuring 16 mm (image number 1, series 5). There is enlarged right lower paratracheal lymph node  measuring 15 mm short axis. Left lower paratracheal lymph node measures 12 mm. There is a large subcarinal lymph node measuring 17 mm short axis. Prominent right hilar lymph node measures 10 mm (image 25, series 2).  Review of the lung parenchyma demonstrates a spiculated nodule at the superior aspect of the right upper lobe measuring 7 x 7 mm (image 11, is series 6). This is also seen on image number 2 of series 4. No additional suspicious pulmonary nodules. Central airways are normal.  CT ABDOMEN AND PELVIS FINDINGS  Low-density lesion the posterior right hepatic lobe has simple fluid attenuation consistent with benign cyst. 3 mm hypodense lesion in the central left hepatic lobe (image 52) is too small to characterize. The gallbladder, pancreas, spleen, adrenal glands, and kidneys are normal.  Stomach, small bowel, and colon are unremarkable.  Abdominal aorta is normal caliber. No retroperitoneal periportal lymphadenopathy.  No free fluid the pelvis. The prostate gland and bladder normal. No pelvic lymphadenopathy. No aggressive osseous lesion.  IMPRESSION: 1. Mediastinal lymphadenopathy is concerning for metastatic adenopathy. 2. Small right upper lobe spiculated nodule may represent primary bronchogenic carcinoma. 3. Potential right supraclavicular enlarged lymph node. Ultrasound evaluation could delineate this lymph node. 4. Low-density lesions in the liver likely represent benign cysts. The smaller cyst is too small to characterize. 5. Patient may ultimately benefit from an outpatient FDG PET-CT staging exam.   Electronically Signed   By: Suzy Bouchard M.D.   On: 01/04/2013 15:20    MEDICATIONS  I have reviewed the patient's current medications.  ASSESSMENT/PLAN:                                                                                                           68 y/o with new  onset structural right focal motor seizure in the context of 11 x 13 mm enhancing lesion in the left parietal lobe with pronounced regional vasogenic edema causing mass-effect on the posterior aspect of the left lateral ventricle. Second enhancing focus in the inferior cerebellum on the left measuring 6 mm in diameter with mild regional edema.  Aaron Edelman metastasis most likely explanation. Continue keppra and decadron. Further intervention as per neurosurgery and oncology. Neurology will sign off. Please, call neurology with questions or concerns.  Dorian Pod, MD [facility] [phone]  01/05/2013, 9:17 AM

## 2013-01-05 NOTE — Evaluation (Signed)
Occupational Therapy Evaluation Patient Details Name: Carlos Beasley MRN: [id number] DOB: 1945/01/16 Today's Date: 01/05/2013 Time: 6962-9528 OT Time Calculation (min): 30 min  OT Assessment / Plan / Recommendation History of present illness Pt admitted with new onset of seizure.  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 malignancy.   Clinical Impression   Pt is moving relatively well, minimal balance deficits.  Pt presents with impaired light touch and proprioception distally in R UE interfering with functional use and coordination.  Pt will benefit from education in injury prevention, home activity program and addressing ADL transfers.  Pt plans return home with his father who is in good health.    OT Assessment  Patient needs continued OT Services    Follow Up Recommendations  Home health OT    Barriers to Discharge      Equipment Recommendations  None recommended by OT    Recommendations for Other Services    Frequency  Min 2X/week    Precautions / Restrictions Precautions Precautions: Fall Restrictions Weight Bearing Restrictions: No   Pertinent Vitals/Pain VSS, no pain    ADL  Eating/Feeding: Modified independent (uses mouth to open packages) Where Assessed - Eating/Feeding: Chair Grooming: Wash/dry hands;Wash/dry face;Min guard Where Assessed - Grooming: Unsupported standing Upper Body Bathing: Set up Where Assessed - Upper Body Bathing: Unsupported sitting Lower Body Bathing: Min guard Where Assessed - Lower Body Bathing: Unsupported sitting;Supported sit to stand Upper Body Dressing: Set up Where Assessed - Upper Body Dressing: Unsupported sitting Lower Body Dressing: Min guard (can donn socks in sitting independently) Where Assessed - Lower Body Dressing: Unsupported sitting;Supported sit to stand Toilet Transfer: Minimal assistance Toilet Transfer Method: Sit to Loss adjuster, chartered: Bedside commode Toileting - Clothing  Manipulation and Hygiene: Min guard Where Assessed - Best boy and Hygiene: Standing Equipment Used: Gait belt Transfers/Ambulation Related to ADLs: ambulated with min assist for balance, no device ADL Comments: Pt limited by decreased coordination on R side and balance deficits.    OT Diagnosis: Generalized weakness;Hemiplegia non-dominant side  OT Problem List: Decreased strength;Impaired balance (sitting and/or standing);Decreased coordination;Impaired sensation;Impaired UE functional use OT Treatment Interventions: Self-care/ADL training;Therapeutic activities;Patient/family education;Balance training   OT Goals(Current goals can be found in the care plan section) Acute Rehab OT Goals Patient Stated Goal: home OT Goal Formulation: With patient Time For Goal Achievement: 01/19/13 Potential to Achieve Goals: Good ADL Goals Pt Will Transfer to Toilet: with modified independence;ambulating;regular height toilet Pt Will Perform Tub/Shower Transfer: with modified independence;ambulating;grab bars;Tub transfer Pt/caregiver will Perform Home Exercise Program: Right Upper extremity;With theraputty;With written HEP provided (fine motor coordination activities) Additional ADL Goal #1: Pt will state compensatory strategies for impaired R hand sensation to prevent injury.  Visit Information  Last OT Received On: 01/05/13 Assistance Needed: +1 History of Present Illness: Pt admitted with new onset of seizure.  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 malignancy.       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 Communication Communication: No difficulties Dominant Hand: Left (  uses L for fine motor, R for gross)         Vision/Perception Vision - History Baseline Vision: Wears glasses only for reading Patient Visual Report: No change from baseline   Cognition  Cognition Arousal/Alertness: Awake/alert Behavior During Therapy: WFL for tasks assessed/performed Overall Cognitive Status: Within Functional Limits for tasks assessed    Extremity/Trunk Assessment Upper Extremity Assessment Upper Extremity Assessment: RUE deficits/detail RUE Deficits / Details: 4/5 shoulder, 5/5 elbow to hand RUE Sensation: decreased light touch;decreased proprioception RUE Coordination: decreased fine motor Lower Extremity Assessment Lower Extremity Assessment: Defer to PT evaluation RLE Deficits / Details: grossly 4-/5 RLE Sensation: decreased light touch Cervical / Trunk Assessment Cervical / Trunk Assessment: Normal     Mobility Bed Mobility Bed Mobility: Not assessed Supine to Sit: 5: Supervision;HOB flat Sitting - Scoot to Edge of Bed: 5: Supervision Details for Bed Mobility Assistance: pt with safe technique Transfers Transfers: Sit to Stand;Stand to Sit Sit to Stand: 4: Min guard;With upper extremity assist;From chair/3-in-1 Stand to Sit: 4: Min guard;With upper extremity assist;To chair/3-in-1 Details for Transfer Assistance: pt rocked to gain momentum to achieve anterior weight shift     Exercise     Balance Balance Balance Assessed: Yes Dynamic Sitting Balance Dynamic Sitting - Balance Support: Feet supported Dynamic Sitting - Level of Assistance: 7: Independent Dynamic Sitting - Balance Activities: Lateral lean/weight shifting Static Standing Balance Static Standing - Balance Support: No upper extremity supported;During functional  activity Static Standing - Level of Assistance: 5: Stand by assistance Static Standing - Comment/# of Minutes: 2   End of Session OT - End of Session Activity Tolerance: Patient tolerated treatment well Patient left: in chair;with call bell/phone within reach  GO     Malka So 01/05/2013, 11:38 AM [phone]

## 2013-01-05 NOTE — Progress Notes (Signed)
Patient ID: Carlos Beasley, male   DOB: Mar 13, 1945, 68 y.o.   MRN: [id number] Is doing fine no complaints no headaches no seizures no nausea vomiting no numbness  Awake alert oriented neurologically intact with no pronator drift  CT scan just and pelvis consistent with multiple hyperdense  Hilar adenopathy and a spiculated lung lesion  Recommend proceed with biopsy of the pretracheal and peri-Hilar lymph nodes to obtain diagnostic tissue I think this is much more consistent with metastatic disease and would favor making diagnoses with the biopsy of the lymph nodes and avoid brain biopsy should diagnosis beam able to be made than would recommend radiosurgery of the brain lesions.

## 2013-01-05 NOTE — Progress Notes (Addendum)
[facility] PROGRESS NOTE  JAKEB LAMPING RJJ:[id number] DOB: [birth year] DOA: 01/02/2013 PCP: No primary provider on file.  Brief narrative This 68 year old male with history of active smoking, hypertension , cocaine use, who presented to the ED with right-sided weakness. Upon arrival to the ED at tonic-clonic seizures and was in hypertensive urgency with systolic blood pressure >016. Code stroke was called and patient received Ativan and Keppra. Head CT done which showed large area of edema of the left parietal lobe. MRI brain showed 11 X. 13 mm enhancing lesion in the left parietal lobe with pronouncedly is no vasogenic edema suggestive of metastatic disease versus infectious cerebritis.  Assessment/Plan: Acute encephalopathy Likely in this setting while left parietal lobe mass with seizures. Carotid Doppler shows 60-70% stenosis of the left carotid bulb. Continue IV Keppra. Continue neuro checks. CT chest abdomen and pelvis done for metastatic workup which was bilateral diffuse hilar and mediastinal lymphadenopathy. Review results below. -PCCM consulted for evaluation for lung biopsy. Likely metastatic disease. -Continue neuro checks. -Continue Keppra. IV Ativan when necessary for seizures. -Continue Decadron. -Placed on full dose aspirin   Hypertensive urgency Continue Norvasc and hydralazine. Adjust dose. Avoid beta blocker given cocaine use  Tobacco abuse Counseled on cessation  Cardiomyopathy EF of  30-35%. Likely ischemic vs hypertensive Currently euvolemic. Will add lisinopril. continue ASA   Code Status: Full code Family Communication: None at bedside Disposition Plan: Pending workup   Consultants:  Neurosurgery  ID  PCCM  Procedures:  None  Antibiotics:  None  HPI/Subjective: NO Overnight issues  Objective: Filed Vitals:   01/05/13 1600  BP: 166/95  Pulse:   Temp: 98.3 F (36.8 C)  Resp:     Intake/Output Summary (Last 24 hours) at  01/05/13 1744 Last data filed at 01/05/13 1700  Gross per 24 hour  Intake   2350 ml  Output   1225 ml  Net   1125 ml   Filed Weights   01/02/13 1800 01/05/13 0435  Weight: 71 kg (156 lb 8.4 oz) 73 kg (160 lb 15 oz)    Exam:   General: Elderly male in no acute distress  HEENT: No pallor, moist oral mucosa  Chest: Clear to auscultation bilaterally, no added sound  CVS: Normal S1 and S2, no murmurs or gallop  Doing: Soft, nontender, nondistended, bowel sounds present  Extremities: Warm, no edema    Data Reviewed: Basic Metabolic Panel:  Recent Labs Lab 01/02/13 1410 01/02/13 1420 01/02/13 1600 01/03/13 0255  NA 143 143  --  142  K 4.8 4.6  --  4.9  CL 103 105  --  105  CO2 24  --   --  26  GLUCOSE 127* 127*  --  105*  BUN 19 22  --  18  CREATININE 1.65* 1.80*  --  1.42*  CALCIUM 9.3  --   --  8.7  MG  --   --  2.2  --    Liver Function Tests:  Recent Labs Lab 01/02/13 1410 01/02/13 1600 01/03/13 0255  AST 19 15 14   ALT 11 10 9   ALKPHOS 50 45 45  BILITOT 0.4 0.3 <0.2*  PROT 8.4* 7.4 7.2  ALBUMIN 3.7 3.4* 3.1*   No results found for this basename: LIPASE, AMYLASE,  in the last 168 hours No results found for this basename: AMMONIA,  in the last 168 hours CBC:  Recent Labs Lab 01/02/13 1410 01/02/13 1420 01/02/13 1600 01/03/13 0255  WBC 4.9  --  5.0  4.8  NEUTROABS 2.2  --   --   --   HGB 13.5 14.3 11.9* 12.6*  HCT 41.4 42.0 36.2* 39.0  MCV 90.6  --  91.2 90.3  PLT 238  --  225 238   Cardiac Enzymes:  Recent Labs Lab 01/02/13 1410 01/02/13 1600 01/02/13 2130 01/03/13 0255  TROPONINI <0.30 <0.30 <0.30 <0.30   BNP (last 3 results) No results found for this basename: PROBNP,  in the last 8760 hours CBG:  Recent Labs Lab 01/02/13 1445 01/03/13 0605 01/04/13 0748  GLUCAP 112* 123* 84    Recent Results (from the past 240 hour(s))  MRSA PCR SCREENING     Status: None   Collection Time    01/02/13  5:56 PM      Result Value  Range Status   MRSA by PCR NEGATIVE  NEGATIVE Final   Comment:            The GeneXpert MRSA Assay (FDA     approved for NASAL specimens     only), is one component of a     comprehensive MRSA colonization     surveillance program. It is not     intended to diagnose MRSA     infection nor to guide or     monitor treatment for     MRSA infections.  CULTURE, BLOOD (ROUTINE X 2)     Status: None   Collection Time    01/04/13 11:00 AM      Result Value Range Status   Specimen Description BLOOD RIGHT ARM   Final   Special Requests     Final   Value: BOTTLES DRAWN AEROBIC AND ANAEROBIC 10CC BLUE, 5CC RED   Culture  Setup Time     Final   Value: 01/04/2013 19:28     Performed at [facility]   Culture     Final   Value:        BLOOD CULTURE RECEIVED NO GROWTH TO DATE CULTURE WILL BE HELD FOR 5 DAYS BEFORE ISSUING A FINAL NEGATIVE REPORT     Performed at [facility]   Report Status PENDING   Incomplete  CULTURE, BLOOD (ROUTINE X 2)     Status: None   Collection Time    01/04/13 11:10 AM      Result Value Range Status   Specimen Description BLOOD RIGHT ARM   Final   Special Requests BOTTLES DRAWN AEROBIC ONLY [facility]   Final   Culture  Setup Time     Final   Value: 01/04/2013 19:29     Performed at [facility]   Culture     Final   Value:        BLOOD CULTURE RECEIVED NO GROWTH TO DATE CULTURE WILL BE HELD FOR 5 DAYS BEFORE ISSUING A FINAL NEGATIVE REPORT     Performed at [facility]   Report Status PENDING   Incomplete     Studies: Ct Chest W Contrast  01/04/2013   CLINICAL DATA:  Brain lesion on comparison MRI. Concern for metastatic disease.  EXAM: CT CHEST, ABDOMEN, AND PELVIS WITH CONTRAST  TECHNIQUE: Multidetector CT imaging of the chest, abdomen and pelvis was performed following the standard protocol during bolus administration of intravenous contrast.  CONTRAST:  154mL OMNIPAQUE IOHEXOL 300 MG/ML  SOLN  COMPARISON:  US SCROTUM dated 09/08/2007;  MR HEAD WO/W CM dated 01/03/2013  FINDINGS: CT CHEST FINDINGS  There is little body fat in the  patient's thorax . No axillary lymphadenopathy. Potential right supraclavicular lymph node measuring 16 mm (image number 1, series 5). There is enlarged right lower paratracheal lymph node measuring 15 mm short axis. Left lower paratracheal lymph node measures 12 mm. There is a large subcarinal lymph node measuring 17 mm short axis. Prominent right hilar lymph node measures 10 mm (image 25, series 2).  Review of the lung parenchyma demonstrates a spiculated nodule at the superior aspect of the right upper lobe measuring 7 x 7 mm (image 11, is series 6). This is also seen on image number 2 of series 4. No additional suspicious pulmonary nodules. Central airways are normal.  CT ABDOMEN AND PELVIS FINDINGS  Low-density lesion the posterior right hepatic lobe has simple fluid attenuation consistent with benign cyst. 3 mm hypodense lesion in the central left hepatic lobe (image 52) is too small to characterize. The gallbladder, pancreas, spleen, adrenal glands, and kidneys are normal.  Stomach, small bowel, and colon are unremarkable.  Abdominal aorta is normal caliber. No retroperitoneal periportal lymphadenopathy.  No free fluid the pelvis. The prostate gland and bladder normal. No pelvic lymphadenopathy. No aggressive osseous lesion.  IMPRESSION: 1. Mediastinal lymphadenopathy is concerning for metastatic adenopathy. 2. Small right upper lobe spiculated nodule may represent primary bronchogenic carcinoma. 3. Potential right supraclavicular enlarged lymph node. Ultrasound evaluation could delineate this lymph node. 4. Low-density lesions in the liver likely represent benign cysts. The smaller cyst is too small to characterize. 5. Patient may ultimately benefit from an outpatient FDG PET-CT staging exam.   Electronically Signed   By: Suzy Bouchard M.D.   On: 01/04/2013 15:20   Mr Jeri Cos HO Contrast  01/03/2013   CLINICAL  DATA:  Seizure activity. History of drug abuse. Right-sided weakness.  EXAM: MRI HEAD WITHOUT AND WITH CONTRAST  TECHNIQUE: Multiplanar, multiecho pulse sequences of the brain and surrounding structures were obtained without and with intravenous contrast.  CONTRAST:  60mL MULTIHANCE GADOBENATE DIMEGLUMINE 529 MG/ML IV SOLN  COMPARISON:  Head CT 01/02/2013  FINDINGS: There is no evidence of ischemic infarction. Within the left parietal lobe, there is an 11 x 13 mm enhancing focus with pronounced regional vasogenic edema. A 2nd enhancing focus is present within the inferior cerebellum on the left measuring 6 mm in diameter. There is minimal edema in that region. Elsewhere, there are a few punctate foci of T2 and FLAIR signal in the hemispheric white matter not associated with abnormal enhancement and most likely indicative of old small vessel insults. There is a small focus of hemorrhage within the region of edema in the left parietal lobe. This is no larger than 3-4 mm. No hydrocephalus or extra-axial collection. Because of the edema, there is mass-effect on the posterior aspect of the left lateral ventricle. No midline shift. No pituitary mass. Sinuses are clear. No skull or skullbase lesion.  IMPRESSION: 11 x 13 mm enhancing lesion in the left parietal lobe with pronounced regional vasogenic edema. Second enhancing focus in the inferior cerebellum on the left measuring 6 mm in diameter with mild regional edema. The differential diagnosis is metastatic disease versus areas of focal cerebritis. Given the history of drug abuse, infectious cerebritis is certainly possible. The amount of vasogenic edema associated with the left parietal lesion is extreme, a finding often associated with acute cerebritis. .   Electronically Signed   By: Nelson Chimes M.D.   On: 01/03/2013 19:30   Ct Abdomen Pelvis W Contrast  01/04/2013   CLINICAL DATA:  Brain lesion on comparison MRI. Concern for metastatic disease.  EXAM: CT CHEST,  ABDOMEN, AND PELVIS WITH CONTRAST  TECHNIQUE: Multidetector CT imaging of the chest, abdomen and pelvis was performed following the standard protocol during bolus administration of intravenous contrast.  CONTRAST:  134mL OMNIPAQUE IOHEXOL 300 MG/ML  SOLN  COMPARISON:  US SCROTUM dated 09/08/2007; MR HEAD WO/W CM dated 01/03/2013  FINDINGS: CT CHEST FINDINGS  There is little body fat in the patient's thorax . No axillary lymphadenopathy. Potential right supraclavicular lymph node measuring 16 mm (image number 1, series 5). There is enlarged right lower paratracheal lymph node measuring 15 mm short axis. Left lower paratracheal lymph node measures 12 mm. There is a large subcarinal lymph node measuring 17 mm short axis. Prominent right hilar lymph node measures 10 mm (image 25, series 2).  Review of the lung parenchyma demonstrates a spiculated nodule at the superior aspect of the right upper lobe measuring 7 x 7 mm (image 11, is series 6). This is also seen on image number 2 of series 4. No additional suspicious pulmonary nodules. Central airways are normal.  CT ABDOMEN AND PELVIS FINDINGS  Low-density lesion the posterior right hepatic lobe has simple fluid attenuation consistent with benign cyst. 3 mm hypodense lesion in the central left hepatic lobe (image 52) is too small to characterize. The gallbladder, pancreas, spleen, adrenal glands, and kidneys are normal.  Stomach, small bowel, and colon are unremarkable.  Abdominal aorta is normal caliber. No retroperitoneal periportal lymphadenopathy.  No free fluid the pelvis. The prostate gland and bladder normal. No pelvic lymphadenopathy. No aggressive osseous lesion.  IMPRESSION: 1. Mediastinal lymphadenopathy is concerning for metastatic adenopathy. 2. Small right upper lobe spiculated nodule may represent primary bronchogenic carcinoma. 3. Potential right supraclavicular enlarged lymph node. Ultrasound evaluation could delineate this lymph node. 4. Low-density lesions  in the liver likely represent benign cysts. The smaller cyst is too small to characterize. 5. Patient may ultimately benefit from an outpatient FDG PET-CT staging exam.   Electronically Signed   By: Suzy Bouchard M.D.   On: 01/04/2013 15:20    Scheduled Meds: . amLODipine  5 mg Oral Daily  . antiseptic oral rinse  15 mL Mouth Rinse q12n4p  . aspirin EC  325 mg Oral Daily  . aspirin  300 mg Rectal Daily  . chlorhexidine  15 mL Mouth Rinse BID  . dexamethasone  4 mg Intravenous Q6H  . enoxaparin (LOVENOX) injection  40 mg Subcutaneous Q24H  . levETIRAcetam  1,000 mg Oral BID  . LORazepam  1 mg Intravenous Once   Continuous Infusions: . sodium chloride 50 mL/hr at 01/04/13 2300      Time spent: [place], [place]  [facility] Pager [phone] If 7PM-7AM, please contact night-coverage at [email or web address], password [facility] 01/05/2013, 5:44 PM  LOS: 3 days

## 2013-01-06 ENCOUNTER — Inpatient Hospital Stay (HOSPITAL_COMMUNITY): Payer: Medicare Other

## 2013-01-06 DIAGNOSIS — F17200 Nicotine dependence, unspecified, uncomplicated: Secondary | ICD-10-CM

## 2013-01-06 DIAGNOSIS — R222 Localized swelling, mass and lump, trunk: Secondary | ICD-10-CM

## 2013-01-06 DIAGNOSIS — R918 Other nonspecific abnormal finding of lung field: Secondary | ICD-10-CM

## 2013-01-06 DIAGNOSIS — G939 Disorder of brain, unspecified: Secondary | ICD-10-CM

## 2013-01-06 DIAGNOSIS — I6789 Other cerebrovascular disease: Secondary | ICD-10-CM

## 2013-01-06 DIAGNOSIS — C3490 Malignant neoplasm of unspecified part of unspecified bronchus or lung: Secondary | ICD-10-CM | POA: Diagnosis present

## 2013-01-06 DIAGNOSIS — R599 Enlarged lymph nodes, unspecified: Secondary | ICD-10-CM

## 2013-01-06 DIAGNOSIS — I428 Other cardiomyopathies: Secondary | ICD-10-CM

## 2013-01-06 LAB — QUANTIFERON TB GOLD ASSAY (BLOOD)
Mitogen value: 0.07 IU/mL
Quantiferon Nil Value: 0.01 IU/mL
TB ANTIGEN MINUS NIL VALUE: 0 [IU]/mL
TB Ag value: 0.01 IU/mL

## 2013-01-06 MED ORDER — ZOLPIDEM TARTRATE 5 MG PO TABS
5.0000 mg | ORAL_TABLET | Freq: Once | ORAL | Status: AC
  Administered 2013-01-06: 5 mg via ORAL
  Filled 2013-01-06: qty 1

## 2013-01-06 NOTE — Progress Notes (Addendum)
Physical Therapy Treatment Patient Details Name: Carlos Beasley MRN: [id number] DOB: 10-17-1945 Today's Date: 01/06/2013 Time: 1201-1217 PT Time Calculation (min): 16 min  PT Assessment / Plan / Recommendation  History of Present Illness Pt admitted with new onset of seizure.  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 malignancy.   PT Comments   Pt does continue to move well, but there are some worrisome safety issues and even with his father there to provide 24/7 assist I believe the pt is at a very high fall risk between his physical weakness and lack of awareness (functionally) of his deficits.  I encouraged him to pursue CIR level of therapy before he goes home to increase his independence and safety and make him less reliant on his father's support.  I educated him today that he could by no means get up without his father's hands on assist at home.     Follow Up Recommendations  CIR     Does the patient have the potential to tolerate intense rehabilitation    Yes  Barriers to Discharge   None      Equipment Recommendations  None recommended by PT (pt has a cane at home, recommend he uses)    Recommendations for Other Services Rehab consult, SLP cognitive eval  Frequency Min 4X/week   Progress towards PT Goals Progress towards PT goals: Progressing toward goals  Plan Discharge plan needs to be updated    Precautions / Restrictions Precautions Precautions: Fall Precaution Comments: right sided weakness and inattention.    Pertinent Vitals/Pain See vitals flow sheet.    Mobility  Bed Mobility Bed Mobility: Supine to Sit;Sitting - Scoot to Edge of Bed Supine to Sit: 6: Modified independent (Device/Increase time);With rails;HOB flat Sitting - Scoot to Edge of Bed: 6: Modified independent (Device/Increase time);With rail Details for Bed Mobility Assistance: using railing for leverage during transitions.   Transfers Transfers: Sit to Stand;Stand to Sit Sit  to Stand: 4: Min assist;With upper extremity assist;From bed Stand to Sit: 4: Min assist;Without upper extremity assist;To chair/3-in-1 Details for Transfer Assistance: min assist to stabilize trunk for balance.  Verbal cues for safe hand placement during transitions.   Ambulation/Gait Ambulation/Gait Assistance: 4: Min assist Ambulation Distance (Feet): 200 Feet Assistive device: 1 person hand held assist;Other (Comment) (IV pole) Ambulation/Gait Assistance Details: verbal cues for attention to right sided obstacles, verbal cues to not push IV pole too far away from himself, verbal cues to increase right foot clearance especially as he fatigued.  Assist at trunk for balance during gait as pt's trunk was forward of his feet/COG.   Gait Pattern: Step-through pattern;Decreased step length - right;Decreased hip/knee flexion - right;Decreased dorsiflexion - right;Shuffle;Narrow base of support Gait velocity: decreased Stairs: Yes Stairs Assistance: 3: Mod assist Stairs Assistance Details (indicate cue type and reason): mod assist to support trunk for balance during stairs management.  Max verbal cues for safe hand placement and leg sequencing.   Stair Management Technique: Two rails;Step to pattern;Forwards Number of Stairs: 3 (limited by length of IV line)      PT Goals (current goals can now be found in the care plan section) Acute Rehab PT Goals Patient Stated Goal: home  Visit Information  Last PT Received On: 01/06/13 Assistance Needed: +1 History of Present Illness: Pt admitted with new onset of seizure.  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 malignancy.    Subjective Data  Subjective: Pt reports that  his father is very able.  He confirmed that his father would be there to provide hands on assist any time he got up.  I mentioned rehab (CIR) for him to consider.   Patient Stated Goal: home   Cognition  Cognition Arousal/Alertness: Awake/alert Behavior During  Therapy: WFL for tasks assessed/performed Overall Cognitive Status: Impaired/Different from baseline Area of Impairment: Safety/judgement;Awareness Safety/Judgement: Decreased awareness of safety;Decreased awareness of deficits Awareness: Anticipatory General Comments: pt running into things on his right side during gait.  Decreased awareness of disease process and deficits.      Balance  Balance Balance Assessed: Yes Static Sitting Balance Static Sitting - Balance Support: Feet supported Static Sitting - Level of Assistance: 7: Independent Static Standing Balance Static Standing - Balance Support: Bilateral upper extremity supported Static Standing - Level of Assistance: 5: Stand by assistance Dynamic Standing Balance Dynamic Standing - Balance Support: Bilateral upper extremity supported Dynamic Standing - Level of Assistance: 4: Min assist;3: Mod assist Dynamic Standing - Comments: up to mod assist depending on the difficulty of dynamic activity  End of Session PT - End of Session Equipment Utilized During Treatment: Gait belt Activity Tolerance: Patient tolerated treatment well Patient left: in chair;with call bell/phone within reach;with chair alarm set    Ursula Dermody B. [place], [place], DPT [phone]   01/06/2013, 1:49 PM

## 2013-01-06 NOTE — Progress Notes (Signed)
Speech Language Pathology Treatment: Dysphagia  Patient Details Name: Carlos Beasley MRN: [id number] DOB: 10/24/1945 Today's Date: 01/06/2013 Time: 3967-2897 SLP Time Calculation (min): 21 min  Assessment / Plan / Recommendation Clinical Impression  Pt. With no overt s/s of aspiration at bedside, and denies any dysphagia.  RN reports pt. Has been eating well, and was able to swallow pills whole with applesauce without difficulty.    Pt. Requested that SLP get his wallet and phone out of his jeans pocket.  Belongings bag was given to pt. And SLP helped pt. Find his wallet and phone.  These items were left with the patient on his bedside table.  RN notified.   HPI HPI: 68 year old male presents to [facility] via [place] as code stroke.  Per report patient was LSW at 1200 - then was found leaning against a wall with right sided weakness.  EMS reports active right side seizure on their arrival.  On arrival to ED patient was alert - having right side tonic clonic type movements  = he is alert and able to answer questions and follow some commands.  NIHSS 6 - see note for details.  CT scan report per Dr. Aram Beecham ? Acute infarct vs mass with an edema pattern.    Patient with worsening seizure activity - given total 4 mg Ativan IV - then loaded with 1000 mg Keppra.  Also given 10 mg Decadron per MD order.  Seizure activity controlled - patient sleepy but arouses - maintaining airway and O2 sats - right arm/leg weakness remains - has tone   Pertinent Vitals Afebrile; LS diminished but clear; respirations regular, unlabored.  SLP Plan  Discharge SLP treatment due to (comment);All goals met    Recommendations Diet recommendations: Regular;Thin liquid Liquids provided via: Straw Medication Administration: Whole meds with puree Supervision: Intermittent supervision to cue for compensatory strategies Compensations: Slow rate;Small sips/bites;Multiple dry swallows after each bite/sip;Clear throat after each  swallow;Hard cough after swallow Postural Changes and/or Swallow Maneuvers: Seated upright 90 degrees;Upright 30-60 min after meal              Plan: Discharge SLP treatment due to (comment);All goals met    GO     Quinn Axe T 01/06/2013, 11:32 AM

## 2013-01-06 NOTE — Procedures (Signed)
R supraclavicular LN biopsy 18 g core times three No comp

## 2013-01-06 NOTE — Progress Notes (Signed)
Utilization review completed.  

## 2013-01-06 NOTE — Consult Note (Signed)
PULMONARY / CRITICAL CARE MEDICINE  Name: Carlos Beasley MRN: [id number] DOB: July 11, 1945    ADMISSION DATE:  01/02/2013 CONSULTATION DATE:  01/06/2013  REFERRING MD :  Dr. Valinda Party - [facility] PRIMARY SERVICE:  [facility]  CHIEF COMPLAINT:  Abnormal CT Chest  BRIEF PATIENT DESCRIPTION: 68 y/o M admitted with R sided weakness, hypertension as a code stroke.  Abnormal CT Head with concern for metastatic cancer prompted further screening imaging which revealed CT Chest with mediastinal lymphadenopathy, small RUL spiculated nodule concerning for primary bronchogenic carcinoma.  PCCM consulted for evaluation / biopsy.   SIGNIFICANT EVENTS / STUDIES:  1/2 -  Head CT >>>  Large area of edema within the left parietal lobe. There is mass effect and minimal midline shift. Probable artifactual hypoattenuation in the left cerebellar hemisphere. 1/3 -  Brain MRI >>> 11 x 13 mm enhancing lesion in the left parietal lobe with pronounced regional vasogenic edema. Second enhancing focus in the inferior cerebellum on the left measuring 6 mm in diameter with mild regional edema. 1/4 -  Abdomen / pelvis CT >>>  Low-density lesions in the liver likely represent benign cysts. The smaller cyst is too small to characterize. 1/4 -  Chest CT >>>  Mediastinal lymphadenopathy is concerning for metastatic adenopathy. Small right upper lobe spiculated nodule may represent primary bronchogenic carcinoma. Potential right supraclavicular enlarged lymph node.  LINE / TUBES:  CULTURES: 1/4  Blood >>>  ANTIBIOTICS:  HISTORY OF PRESENT ILLNESS:  68 y/o M with PMH of medical non-compliance, cocaine abuse & HTN who presented to [facility] ER on 1/2 with R sided weakness.  In ER, he was noted to have tonic-clonic movements of R arm / leg and to have significant HTN with SBP > 200.  Patient was evaluated as a code stroke. CT of head completed which showed large area of edema of the left parietal lobe. MRI brain showed 11 X. 13 mm enhancing lesion in the  left parietal lobe with pronouncedly is no vasogenic edema suggestive of metastatic disease versus infectious cerebritis.  Further surveillance completed with CT ABD / Chest which found low-density lesions in the liver likely represent benign cysts, Mediastinal lymphadenopathy is concerning for metastatic adenopathy. Small right upper lobe spiculated nodule may represent primary bronchogenic carcinoma. Potential right supraclavicular enlarged lymph node.  PCCM consulted for evaluation of mediastinal adenopathy.    Patient denies weight loss, fevers, chills, shortness of breath, hemoptysis, decreased appetite, chest pain, swelling of nodes in neck.  He reports 3 wks of cough with white sputum, sore tongue on the right side of his tongue.    PAST MEDICAL HISTORY :  Past Medical History  Diagnosis Date  . Medical history non-contributory   . Hypertension    Past Surgical History  Procedure Laterality Date  . No past surgeries     Prior to Admission medications   Not on File   No Known Allergies  FAMILY HISTORY:  Family History  Problem Relation Age of Onset  . Heart Problems Mother     SOCIAL HISTORY:  reports that he has been smoking Cigarettes.  He has a 12.5 pack-year smoking history. He has never used smokeless tobacco. He reports that he drinks alcohol. He reports that he uses illicit drugs (Cocaine).  REVIEW OF SYSTEMS:  SEE HPI for pertinent positives, all systems reviewed and otherwise negative.   SUBJECTIVE: "my tongue hurts"  VITAL SIGNS: Temp:  [97.7 F (36.5 C)-98.3 F (36.8 C)] 98.2 F (36.8 C) (01/06 0407) Pulse  Rate:  [58-64] 62 (01/06 0407) Resp:  [11-23] 18 (01/06 0407) BP: (145-166)/(78-95) 146/82 mmHg (01/06 0407) SpO2:  [94 %-98 %] 97 % (01/06 0407)  PHYSICAL EXAMINATION: General:  Resting comfortably, appears to be in no acute distress Neuro:  Awake, alert, cooperative with examination, non-focal HEENT:  NCAT, PERRL, moist membranes Neck:  Soft, no  bruits, no lymphadenopathy, no carotid btuits Cardiovascular:  RRR, no m/r/g Lungs:  Bilateral air entry, no w/r/r Abdomen:  Soft, nontender, bowel sounds present, no organomegaly Musculoskeletal:  Moves all extremities, no edema, no digital clubbing Skin:  Intact, no rash   Recent Labs Lab 01/02/13 1410 01/02/13 1420 01/03/13 0255  NA 143 143 142  K 4.8 4.6 4.9  CL 103 105 105  CO2 24  --  26  BUN 19 22 18   CREATININE 1.65* 1.80* 1.42*  GLUCOSE 127* 127* 105*    Recent Labs Lab 01/02/13 1410 01/02/13 1420 01/02/13 1600 01/03/13 0255  HGB 13.5 14.3 11.9* 12.6*  HCT 41.4 42.0 36.2* 39.0  WBC 4.9  --  5.0 4.8  PLT 238  --  225 238    CXR:  ASSESSMENT / PLAN:  Multiple brain lesions, likely metastatic, less likely infectious in nature Right upper lobe speculated nodule suspicious for lung primary Mediastinal lymphadenopathy Right supraclavicular adenopathy Tobacco use history R Tongue Lesion   -->  Right supraclavicular node should be the primary target if accessible for US guided biopsy -->  If inaccessible or non diagnostic,  EBUS TBNA should be attempted -->  Appreciate the consult, will follow along with you.   Noe Gens, NP-C [facility] Pgr: [phone] or [phone]  I have personally obtained history, examined patient, evaluated and interpreted laboratory and imaging results, reviewed medical records, formulated assessment / plan and placed orders.  01/06/2013, 7:25 AM

## 2013-01-06 NOTE — Progress Notes (Signed)
[facility] PROGRESS NOTE  Carlos Beasley ZOX:[id number] DOB: [birth year] DOA: 01/02/2013 PCP: No primary provider on file.  Brief narrative  This 68 year old male with history of active smoking, hypertension , cocaine use, who presented to the ED with right-sided weakness. Upon arrival to the ED at tonic-clonic seizures and was in hypertensive urgency with systolic blood pressure >811. Code stroke was called and patient received Ativan and Keppra. Head CT done which showed large area of edema of the left parietal lobe. MRI brain showed 11 X. 13 mm enhancing lesion in the left parietal lobe with pronouncedly is no vasogenic edema suggestive of metastatic disease versus infectious cerebritis.   Assessment/Plan:  Acute encephalopathy  Likely in this setting of  left parietal lobe mass with seizures. Carotid Doppler shows 60-70% stenosis of the left carotid bulb. Continue IV Keppra. Continue neuro checks. CT chest abdomen and pelvis done for metastatic workup which shows  bilateral diffuse hilar and mediastinal lymphadenopathy. Review results below.  . Likely metastatic disease. IR consulted for right supraclavicular LN bx. Done today. Will follow results. Will arrange outpt onc follow up prior to d/c -Continue neuro checks.  -Continue Keppra. IV Ativan when necessary for seizures.  -Continue Decadron.  -Placed on full dose aspirin   Hypertensive urgency  Continue Norvasc and hydralazine. Adjust dose. Avoid beta blocker given cocaine use   Tobacco abuse  Counseled on cessation . Nicotine patch ordered.   Cardiomyopathy  EF of 30-35%. Likely ischemic vs hypertensive  Currently euvolemic. Will add lisinopril. continue ASA   Code Status: Full code  Family Communication: None at bedside  Disposition Plan: CIR  Consultants:  Neurosurgery  ID  PCCM IR  Procedures:  Rt supraclavicular LN bx  Antibiotics:  None   HPI/Subjective:  NO Overnight issues   Objective: Filed Vitals:    01/06/13 1200  BP: 150/88  Pulse: 62  Temp: 98.2 F (36.8 C)  Resp: 18    Intake/Output Summary (Last 24 hours) at 01/06/13 1512 Last data filed at 01/06/13 0019  Gross per 24 hour  Intake    420 ml  Output    975 ml  Net   -555 ml   Filed Weights   01/02/13 1800 01/05/13 0435  Weight: 71 kg (156 lb 8.4 oz) 73 kg (160 lb 15 oz)    Exam:  General: Elderly male in no acute distress  HEENT: No pallor, moist oral mucosa  Chest: Clear to auscultation bilaterally, no added sound  CVS: Normal S1 and S2, no murmurs or gallop  abd: Soft, nontender, nondistended, bowel sounds present  Extremities: Warm, no edema CNS: AAOX3, non focal   Data Reviewed: Basic Metabolic Panel:  Recent Labs Lab 01/02/13 1410 01/02/13 1420 01/02/13 1600 01/03/13 0255  NA 143 143  --  142  K 4.8 4.6  --  4.9  CL 103 105  --  105  CO2 24  --   --  26  GLUCOSE 127* 127*  --  105*  BUN 19 22  --  18  CREATININE 1.65* 1.80*  --  1.42*  CALCIUM 9.3  --   --  8.7  MG  --   --  2.2  --    Liver Function Tests:  Recent Labs Lab 01/02/13 1410 01/02/13 1600 01/03/13 0255  AST 19 15 14   ALT 11 10 9   ALKPHOS 50 45 45  BILITOT 0.4 0.3 <0.2*  PROT 8.4* 7.4 7.2  ALBUMIN 3.7 3.4* 3.1*   No results  found for this basename: LIPASE, AMYLASE,  in the last 168 hours No results found for this basename: AMMONIA,  in the last 168 hours CBC:  Recent Labs Lab 01/02/13 1410 01/02/13 1420 01/02/13 1600 01/03/13 0255  WBC 4.9  --  5.0 4.8  NEUTROABS 2.2  --   --   --   HGB 13.5 14.3 11.9* 12.6*  HCT 41.4 42.0 36.2* 39.0  MCV 90.6  --  91.2 90.3  PLT 238  --  225 238   Cardiac Enzymes:  Recent Labs Lab 01/02/13 1410 01/02/13 1600 01/02/13 2130 01/03/13 0255  TROPONINI <0.30 <0.30 <0.30 <0.30   BNP (last 3 results) No results found for this basename: PROBNP,  in the last 8760 hours CBG:  Recent Labs Lab 01/02/13 1445 01/03/13 0605 01/04/13 0748  GLUCAP 112* 123* 84    Recent  Results (from the past 240 hour(s))  MRSA PCR SCREENING     Status: None   Collection Time    01/02/13  5:56 PM      Result Value Range Status   MRSA by PCR NEGATIVE  NEGATIVE Final   Comment:            The GeneXpert MRSA Assay (FDA     approved for NASAL specimens     only), is one component of a     comprehensive MRSA colonization     surveillance program. It is not     intended to diagnose MRSA     infection nor to guide or     monitor treatment for     MRSA infections.  CULTURE, BLOOD (ROUTINE X 2)     Status: None   Collection Time    01/04/13 11:00 AM      Result Value Range Status   Specimen Description BLOOD RIGHT ARM   Final   Special Requests     Final   Value: BOTTLES DRAWN AEROBIC AND ANAEROBIC 10CC BLUE, 5CC RED   Culture  Setup Time     Final   Value: 01/04/2013 19:28     Performed at [facility]   Culture     Final   Value:        BLOOD CULTURE RECEIVED NO GROWTH TO DATE CULTURE WILL BE HELD FOR 5 DAYS BEFORE ISSUING A FINAL NEGATIVE REPORT     Performed at [facility]   Report Status PENDING   Incomplete  CULTURE, BLOOD (ROUTINE X 2)     Status: None   Collection Time    01/04/13 11:10 AM      Result Value Range Status   Specimen Description BLOOD RIGHT ARM   Final   Special Requests BOTTLES DRAWN AEROBIC ONLY [facility]   Final   Culture  Setup Time     Final   Value: 01/04/2013 19:29     Performed at [facility]   Culture     Final   Value:        BLOOD CULTURE RECEIVED NO GROWTH TO DATE CULTURE WILL BE HELD FOR 5 DAYS BEFORE ISSUING A FINAL NEGATIVE REPORT     Performed at [facility]   Report Status PENDING   Incomplete     Studies: US Biopsy  01/06/2013   CLINICAL DATA:  Abnormal right supraclavicular lymph node.  EXAM: ULTRASOUND GUIDED core BIOPSY OF a right supraclavicular lymph node  COMPLICATIONS: None.  FINDINGS: Images document needle placement in the right supraclavicular lymph node. Post biopsy images  demonstrate no evidence of hemorrhage.  IMPRESSION: Successful right supraclavicular lymph node core biopsy.  : PROCEDURE:  The procedure, risks, benefits, and alternatives were explained to the patient. Questions regarding the procedure were encouraged and answered. The patient understands and consents to the procedure.  The right neck was prepped with Betadine in a sterile fashion, and a sterile drape was applied covering the operative field. A sterile gown and sterile gloves were used for the procedure. Local anesthesia was provided with 1% Lidocaine.  Under sonographic guidance, 3 18 gauge core biopsies of the enlarged right supraclavicular lymph node were obtained without complication. Post biopsy imaging was performed.   Electronically Signed   By: [name] M.D.   On: 01/06/2013 15:04    Scheduled Meds: . amLODipine  10 mg Oral Daily  . antiseptic oral rinse  15 mL Mouth Rinse q12n4p  . aspirin EC  325 mg Oral Daily  . aspirin  300 mg Rectal Daily  . chlorhexidine  15 mL Mouth Rinse BID  . dexamethasone  4 mg Intravenous Q6H  . enoxaparin (LOVENOX) injection  40 mg Subcutaneous Q24H  . hydrALAZINE  25 mg Oral Q8H  . levETIRAcetam  1,000 mg Oral BID  . lisinopril  5 mg Oral Daily  . LORazepam  1 mg Intravenous Once  . nicotine  21 mg Transdermal Daily  . pantoprazole  40 mg Oral Daily   Continuous Infusions: . sodium chloride 50 mL/hr at 01/04/13 2300      Time spent: 25 minutes    Deamonte Sayegh, [place]  [facility] Pager [phone] If 7PM-7AM, please contact night-coverage at [email or web address], password [facility] 01/06/2013, 3:12 PM  LOS: 4 days

## 2013-01-07 ENCOUNTER — Ambulatory Visit
Admit: 2013-01-07 | Discharge: 2013-01-07 | Disposition: A | Discharge status: Home / Self Care | Payer: Medicare Other | Attending: Radiation Oncology | Admitting: Radiation Oncology

## 2013-01-07 DIAGNOSIS — G8110 Spastic hemiplegia affecting unspecified side: Secondary | ICD-10-CM

## 2013-01-07 MED ORDER — DEXTROMETHORPHAN POLISTIREX 30 MG/5ML PO LQCR
30.0000 mg | Freq: Two times a day (BID) | ORAL | Status: DC
  Administered 2013-01-07 – 2013-01-09 (×5): 30 mg via ORAL
  Filled 2013-01-07 (×6): qty 5

## 2013-01-07 MED ORDER — ZOLPIDEM TARTRATE 5 MG PO TABS
5.0000 mg | ORAL_TABLET | Freq: Every evening | ORAL | Status: DC | PRN
  Administered 2013-01-08 (×2): 5 mg via ORAL
  Filled 2013-01-07 (×2): qty 1

## 2013-01-07 MED ORDER — DEXAMETHASONE 4 MG PO TABS
4.0000 mg | ORAL_TABLET | Freq: Four times a day (QID) | ORAL | Status: DC
  Administered 2013-01-07 – 2013-01-09 (×9): 4 mg via ORAL
  Filled 2013-01-07 (×12): qty 1

## 2013-01-07 NOTE — Progress Notes (Addendum)
[facility] PROGRESS NOTE  Carlos Beasley FGH:[id number] DOB: 07-23-1945 DOA: 01/02/2013 PCP: No primary provider on file.  Brief narrative  This 68 year old male with history of active smoking, hypertension , cocaine use, who presented to the ED with right-sided weakness. Upon arrival to the ED at tonic-clonic seizures and was in hypertensive urgency with systolic blood pressure >678. Code stroke was called and patient received Ativan and Keppra. Head CT done which showed large area of edema of the left parietal lobe. MRI brain showed 11 X. 13 mm enhancing lesion in the left parietal lobe with pronouncedly is no vasogenic edema suggestive of metastatic disease versus infectious cerebritis.   Assessment/Plan:  Brain Metastasis with seizure, right sided weakness and acute encephalopathy - Likely in this setting of  left parietal lobe mass with seizures- will need to consult radiation onc - second mass noted in left cerebellum- no gait or visual disturbance on exam - MRI suggestive or cerebritis therefore ID consult ordered- work up negative thus far and more suggestive of metastatic disease  - Continue neuro checks.  -Continue Keppra. IV Ativan when necessary for seizures.  -Continue Decadron- will switch to PO today - avoid anticoagulation as high risk for hemorrhage  Spiculated nodule RUL- metastatic lung cancer - CT chest abdomen and pelvis done for metastatic workup which shows bilateral diffuse hilar and mediastinal lymphadenopathy. -  IR consulted for right supraclavicular LN bx. Will follow results. -  Will need to arrange outpt onc   Left carotid stenosis - discussed with vascular (Dr Early)- will need repeat carotid ultrasound in 6 months- If he becomes symptomatic, will need Ultrasound sooner  Hypertensive urgency  Continue Norvasc, Enalapril and hydralazine. Adjust dose. Avoid beta blocker given cocaine use   Tobacco abuse  Counseled on cessation . Nicotine patch ordered.    Cardiomyopathy  - EF of 30-35%. Appears to be global hypokinesis along with diastolic dysfunction -Currently euvolemic.  - at risk for A-fib with atrial enlargement-  -Cont lisinopril and CCB (for diastolic dysfunction)  Cocaine abuse - advised to quite    Code Status: Full code  Family Communication: None at bedside  Disposition Plan: CIR screening in process (note from this AM)  Consultants:  Neurosurgery  ID  PCCM IR  Procedures:  Rt supraclavicular LN bx  Antibiotics:  None   HPI/Subjective:  Claims to still feel weak on the right side. No trouble with ambulating or vision. Admits to nagging dry cough. No other complaints.    Objective: Filed Vitals:   01/07/13 0754  BP: 143/88  Pulse: 62  Temp: 97.8 F (36.6 C)  Resp:     Intake/Output Summary (Last 24 hours) at 01/07/13 0917 Last data filed at 01/07/13 9381  Gross per 24 hour  Intake    855 ml  Output    325 ml  Net    530 ml   Filed Weights   01/02/13 1800 01/05/13 0435  Weight: 71 kg (156 lb 8.4 oz) 73 kg (160 lb 15 oz)    Exam:  General: Elderly male in no acute distress  HEENT: No pallor, moist oral mucosa  Chest: Clear to auscultation bilaterally, no added sound  CVS: Normal S1 and S2, no murmurs or gallop  abd: Soft, nontender, nondistended, bowel sounds present  Extremities: Warm, no edema CNS: AAOX3, non focal Neuro: strength intact in all 4 extremities, normal reflexed, CN 2-12 intact   Data Reviewed: Basic Metabolic Panel:  Recent Labs Lab 01/02/13 1410 01/02/13 1420 01/02/13  1600 01/03/13 0255  NA 143 143  --  142  K 4.8 4.6  --  4.9  CL 103 105  --  105  CO2 24  --   --  26  GLUCOSE 127* 127*  --  105*  BUN 19 22  --  18  CREATININE 1.65* 1.80*  --  1.42*  CALCIUM 9.3  --   --  8.7  MG  --   --  2.2  --    Liver Function Tests:  Recent Labs Lab 01/02/13 1410 01/02/13 1600 01/03/13 0255  AST 19 15 14   ALT 11 10 9   ALKPHOS 50 45 45  BILITOT 0.4 0.3  <0.2*  PROT 8.4* 7.4 7.2  ALBUMIN 3.7 3.4* 3.1*   No results found for this basename: LIPASE, AMYLASE,  in the last 168 hours No results found for this basename: AMMONIA,  in the last 168 hours CBC:  Recent Labs Lab 01/02/13 1410 01/02/13 1420 01/02/13 1600 01/03/13 0255  WBC 4.9  --  5.0 4.8  NEUTROABS 2.2  --   --   --   HGB 13.5 14.3 11.9* 12.6*  HCT 41.4 42.0 36.2* 39.0  MCV 90.6  --  91.2 90.3  PLT 238  --  225 238   Cardiac Enzymes:  Recent Labs Lab 01/02/13 1410 01/02/13 1600 01/02/13 2130 01/03/13 0255  TROPONINI <0.30 <0.30 <0.30 <0.30   BNP (last 3 results) No results found for this basename: PROBNP,  in the last 8760 hours CBG:  Recent Labs Lab 01/02/13 1445 01/03/13 0605 01/04/13 0748  GLUCAP 112* 123* 84    Recent Results (from the past 240 hour(s))  MRSA PCR SCREENING     Status: None   Collection Time    01/02/13  5:56 PM      Result Value Range Status   MRSA by PCR NEGATIVE  NEGATIVE Final   Comment:            The GeneXpert MRSA Assay (FDA     approved for NASAL specimens     only), is one component of a     comprehensive MRSA colonization     surveillance program. It is not     intended to diagnose MRSA     infection nor to guide or     monitor treatment for     MRSA infections.  CULTURE, BLOOD (ROUTINE X 2)     Status: None   Collection Time    01/04/13 11:00 AM      Result Value Range Status   Specimen Description BLOOD RIGHT ARM   Final   Special Requests     Final   Value: BOTTLES DRAWN AEROBIC AND ANAEROBIC 10CC BLUE, 5CC RED   Culture  Setup Time     Final   Value: 01/04/2013 19:28     Performed at [facility]   Culture     Final   Value:        BLOOD CULTURE RECEIVED NO GROWTH TO DATE CULTURE WILL BE HELD FOR 5 DAYS BEFORE ISSUING A FINAL NEGATIVE REPORT     Performed at [facility]   Report Status PENDING   Incomplete  CULTURE, BLOOD (ROUTINE X 2)     Status: None   Collection Time    01/04/13 11:10  AM      Result Value Range Status   Specimen Description BLOOD RIGHT ARM   Final   Special Requests BOTTLES DRAWN AEROBIC ONLY  [facility]   Final   Culture  Setup Time     Final   Value: 01/04/2013 19:29     Performed at [facility]   Culture     Final   Value:        BLOOD CULTURE RECEIVED NO GROWTH TO DATE CULTURE WILL BE HELD FOR 5 DAYS BEFORE ISSUING A FINAL NEGATIVE REPORT     Performed at [facility]   Report Status PENDING   Incomplete     Studies: US Biopsy  01/06/2013   CLINICAL DATA:  Abnormal right supraclavicular lymph node.  EXAM: ULTRASOUND GUIDED core BIOPSY OF a right supraclavicular lymph node  COMPLICATIONS: None.  FINDINGS: Images document needle placement in the right supraclavicular lymph node. Post biopsy images demonstrate no evidence of hemorrhage.  IMPRESSION: Successful right supraclavicular lymph node core biopsy.  : PROCEDURE:  The procedure, risks, benefits, and alternatives were explained to the patient. Questions regarding the procedure were encouraged and answered. The patient understands and consents to the procedure.  The right neck was prepped with Betadine in a sterile fashion, and a sterile drape was applied covering the operative field. A sterile gown and sterile gloves were used for the procedure. Local anesthesia was provided with 1% Lidocaine.  Under sonographic guidance, 3 18 gauge core biopsies of the enlarged right supraclavicular lymph node were obtained without complication. Post biopsy imaging was performed.   Electronically Signed   By: [name] M.D.   On: 01/06/2013 15:04    Scheduled Meds: . amLODipine  10 mg Oral Daily  . antiseptic oral rinse  15 mL Mouth Rinse q12n4p  . aspirin EC  325 mg Oral Daily  . chlorhexidine  15 mL Mouth Rinse BID  . dexamethasone  4 mg Oral Q6H  . dextromethorphan  30 mg Oral BID  . enoxaparin (LOVENOX) injection  40 mg Subcutaneous Q24H  . hydrALAZINE  25 mg Oral Q8H  . levETIRAcetam  1,000 mg Oral  BID  . lisinopril  5 mg Oral Daily  . LORazepam  1 mg Intravenous Once  . nicotine  21 mg Transdermal Daily  . pantoprazole  40 mg Oral Daily   Continuous Infusions: . sodium chloride 50 mL/hr at 01/07/13 0600      Time spent: 35 minutes    Dewey Neukam, MD  [facility] Pager [phone] If 7PM-7AM, please contact night-coverage at [email or web address], password [facility] 01/07/2013, 9:17 AM  LOS: 5 days

## 2013-01-07 NOTE — Progress Notes (Signed)
Physical Therapy Treatment Patient Details Name: Carlos Beasley MRN: [id number] DOB: [birth year] Today's Date: 01/07/2013 Time: 1211-1227 PT Time Calculation (min): 16 min  PT Assessment / Plan / Recommendation  History of Present Illness Pt admitted with new onset of seizure.  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 malignancy.   PT Comments   Pt's strength improved in right hand and leg today.  He continues to run into things on his right side during gait and lists to the right side of his RW.  Safety and awareness continue to be an issue as pt feels he is getting better and is caught trying to get up without assist.  He continues to be a high fall risk.    Follow Up Recommendations  CIR     Does the patient have the potential to tolerate intense rehabilitation    Yes  Barriers to Discharge   None      Equipment Recommendations  Rolling walker with 5" wheels    Recommendations for Other Services   None  Frequency Min 4X/week   Progress towards PT Goals Progress towards PT goals: Progressing toward goals  Plan Current plan remains appropriate    Precautions / Restrictions Precautions Precautions: Fall Precaution Comments: right sided weakness and inattention.    Pertinent Vitals/Pain See vitals flow sheet.    Mobility  Bed Mobility Overal bed mobility: Modified Independent Transfers Overall transfer level: Needs assistance Equipment used: Rolling walker (2 wheeled) Transfers: Sit to/from Stand Sit to Stand: Min guard General transfer comment: min guard assist for safey Ambulation/Gait Ambulation/Gait assistance: Min assist Ambulation Distance (Feet): 200 Feet Assistive device: Rolling walker (2 wheeled) Gait Pattern/deviations: Step-through pattern;Decreased step length - right;Decreased dorsiflexion - right;Drifts right/left Gait velocity: decreased General Gait Details: pt running into the door frame on his right side, listing to the right side  of the RW, needs assist to steer RW and support trunk as right leg continued to fatigue with increased gait distance increasing his right foot drag and instability of right leg.   Stairs: Yes Stairs assistance: Min assist Stair Management: Two rails;Alternating pattern;Forwards;Step to pattern Number of Stairs: 4 General stair comments: alternating at times and step to at times, verbal cues for safety (lead with strong, step to pattern)      PT Goals (current goals can now be found in the care plan section) Acute Rehab PT Goals Patient Stated Goal: to go home  Visit Information  Last PT Received On: 01/07/13 Assistance Needed: +1 History of Present Illness: Pt admitted with new onset of seizure.  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 malignancy.    Subjective Data  Subjective: Pt reports that he is getting stronger and can move his right hand and leg better today than yesterday.   Patient Stated Goal: to go home   Cognition  Cognition Arousal/Alertness: Awake/alert Behavior During Therapy: WFL for tasks assessed/performed Overall Cognitive Status: Impaired/Different from baseline Area of Impairment: Safety/judgement;Awareness Safety/Judgement: Decreased awareness of safety;Decreased awareness of deficits Awareness: Anticipatory General Comments: Pt continuing to run into doorways and obstacles on his right side.        End of Session PT - End of Session Equipment Utilized During Treatment: Gait belt Activity Tolerance: Patient tolerated treatment well Patient left: in chair;with call bell/phone within reach;with chair alarm set    Endia Moncur B. [place], [place], DPT [phone]   01/07/2013, 6:36 PM

## 2013-01-07 NOTE — Progress Notes (Signed)
Rehab Admissions Coordinator Note:  Patient was screened by Retta Diones for appropriateness for an Inpatient Acute Rehab Consult.  Noted PT recommending CIR.  Patient has [employer].  Until patient has a definitive plan and diagnosis, I will not be able to get approval from [facility].  Call me for questions.   Retta Diones 01/07/2013, 8:19 AM  I can be reached at [phone].

## 2013-01-07 NOTE — Progress Notes (Signed)
Patient status post R supraclavicular lymph node biopsy on 1/6 per IR.  3 core samples sent for pathology.  Results pending.  We will f/u again in am 1/8 for further review.    Noe Gens, NP-C [facility] Pgr: [phone] or [phone]

## 2013-01-07 NOTE — Care Management Note (Signed)
    Page 1 of 2   01/09/2013     11:01:42 AM   CARE MANAGEMENT NOTE 01/09/2013  Patient:  Carlos Beasley, Carlos Beasley   Account Number:  [id number]  Date Initiated:  01/05/2013  Documentation initiated by:  Marvetta Gibbons  Subjective/Objective Assessment:   Pt admitted with new onset of seizure.  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 malignancy     Action/Plan:   PTA pt lived at home with parent (father) PT/OT evals ordered-  NCM to follow for recommendations and d/c needs   Anticipated DC Date:  01/07/2013   Anticipated DC Plan:  [place]  CM consult      Choice offered to / List presented to:             Status of service:  Completed, signed off Medicare Important Message given?   (If response is "NO", the following Medicare IM given date fields will be blank) Date Medicare IM given:   Date Additional Medicare IM given:    Discharge Disposition:  HOME/SELF CARE  Per UR Regulation:  Reviewed for med. necessity/level of care/duration of stay  If discussed at [place] of Stay Meetings, dates discussed:   01/08/2013    Comments:  01-09-13 [place], [place] [phone] Pt is from home with father and pt feels like he will not need any HH services at this time. NO further needs from CM.   01-07-13 1538 Jacqlyn Krauss, RN,BSN [phone] Status post R supraclavicular lymph node biopsy on 1/6 per IR. Results still pending. CIR has been consulted. CM will continue to monitor for disposiiton needs.  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 IR consulted for right supraclavicular LN bx. Done today.- CIR consulted.

## 2013-01-07 NOTE — Consult Note (Signed)
Physical Medicine and Rehabilitation Consult Reason for Consult: Brain tumor/cephalopathy Referring Physician: [facility]   HPI: Carlos Beasley is a 68 y.o. right handed male with history of hypertension as well as medical noncompliance and cocaine abuse. Admitted 01/02/2013 with tonic-clonic movements as well systolic blood pressure greater than 200. Urine drug screen positive cocaine. Code stroke was called patient received Ativan and Keppra. Cranial CT scan showed large area of edema in the parietal lobe. MRI of the brain showed 11 x 13 mm enhancing lesion in the left lower lobe with pronounced regional vasogenic edema as well as second enhancing focus in the inferior cerebellum on the left measuring 6 mm in diameter. Neurosurgery Dr. Saintclair Halsted consulted suspect brain metastatic disease. CT of the chest showed mediastinal lymphadenopathy concerning for metastatic adenopathy. Small right upper lobe spiculated nodule. Interventional radiology consulted and underwent right supraclavicular lymph node biopsy 01/06/2013. Maintained on Decadron protocol. Maintained on subcutaneous Lovenox for DVT prophylaxis. Ongoing for seizure prophylaxis. NicoDerm patch added for tobacco abuse. Physical and occupational therapy evaluations completed. Recommendations made for physical medicine rehabilitation consult to consider inpatient rehabilitation services  The patient lives with his 57 year old father who is reported to be in good health. The patient also has nephews who are also able to help. Review of Systems  Neurological: Positive for dizziness, seizures, weakness and headaches.  All other systems reviewed and are negative.   Past Medical History  Diagnosis Date  . Medical history non-contributory   . Hypertension    Past Surgical History  Procedure Laterality Date  . No past surgeries     Family History  Problem Relation Age of Onset  . Heart Problems Mother    Social History:  reports that he has been  smoking Cigarettes.  He has a 12.5 pack-year smoking history. He has never used smokeless tobacco. He reports that he drinks alcohol. He reports that he uses illicit drugs (Cocaine). 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 Functional Status:  Mobility: Bed Mobility Bed Mobility: Supine to Sit;Sitting - Scoot to Edge of Bed Supine to Sit: 6: Modified independent (Device/Increase time);With rails;HOB flat Sitting - Scoot to Edge of Bed: 6: Modified independent (Device/Increase time);With rail Transfers Transfers: Sit to Stand;Stand to Sit Sit to Stand: 4: Min assist;With upper extremity assist;From bed Stand to Sit: 4: Min assist;Without upper extremity assist;To chair/3-in-1 Ambulation/Gait Ambulation/Gait Assistance: 4: Min assist Ambulation Distance (Feet): 200 Feet Assistive device: 1 person hand held assist;Other (Comment) (IV pole) Ambulation/Gait Assistance Details: verbal cues for attention to right sided obstacles, verbal cues to not push IV pole too far away from himself, verbal cues to increase right foot clearance especially as he fatigued.  Assist at trunk for balance during gait as pt's trunk was forward of his feet/COG.   Gait Pattern: Step-through pattern;Decreased step length - right;Decreased hip/knee flexion - right;Decreased dorsiflexion - right;Shuffle;Narrow base of support Gait velocity: decreased Stairs: Yes Stairs Assistance: 3: Mod  assist Stairs Assistance Details (indicate cue type and reason): mod assist to support trunk for balance during  stairs management.  Max verbal cues for safe hand placement and leg sequencing.   Stair Management Technique: Two rails;Step to pattern;Forwards Number of Stairs: 3 (limited by length of IV line)    ADL: ADL Eating/Feeding: Modified independent (uses mouth to open packages) Where Assessed - Eating/Feeding: Chair Grooming: Wash/dry hands;Wash/dry face;Min guard Where Assessed - Grooming: Unsupported standing Upper Body Bathing: Set up Where Assessed - Upper Body Bathing: Unsupported sitting Lower Body Bathing: Min guard Where Assessed - Lower Body Bathing: Unsupported sitting;Supported sit to stand Upper Body Dressing: Set up Where Assessed - Upper Body Dressing: Unsupported sitting Lower Body Dressing: Min guard (can donn socks in sitting independently) Where Assessed - Lower Body Dressing: Unsupported sitting;Supported sit to stand Toilet Transfer: Minimal assistance Toilet Transfer Method: Sit to stand Toilet Transfer Equipment: Bedside commode Equipment Used: Gait belt Transfers/Ambulation Related to ADLs: ambulated with min assist for balance, no device ADL Comments: Pt limited by decreased coordination on R side and balance deficits.  Cognition: Cognition Overall Cognitive Status: Impaired/Different from baseline Orientation Level: Oriented X4 Cognition Arousal/Alertness: Awake/alert Behavior During Therapy: WFL for tasks assessed/performed Overall Cognitive Status: Impaired/Different from baseline Area of Impairment: Safety/judgement;Awareness Safety/Judgement: Decreased awareness of safety;Decreased awareness of deficits Awareness: Anticipatory General Comments: pt running into things on his right side during gait.  Decreased awareness of disease process and deficits.    Blood pressure 128/79, pulse 62, temperature 97.6 F (36.4 C), temperature source Oral,  resp. rate 18, height 6\' 4"  (1.93 m), weight 73 kg (160 lb 15 oz), SpO2 100.00%. Physical Exam  Vitals reviewed. Constitutional: He is oriented to person, place, and time.  HENT:  Head: Normocephalic.  Eyes: EOM are normal.  No nystagmus  Neck: Normal range of motion. Neck supple. No thyromegaly present.  Cardiovascular: Normal rate and regular rhythm.   Respiratory: Effort normal and breath sounds normal. No respiratory distress.  GI: Soft. Bowel sounds are normal. He exhibits no distension.  Neurological: He is alert and oriented to person, place, and time.  Patient makes good eye contact with examiner. Mild decrease in fine motor skills. He does follow simple commands  Skin: Skin is warm and dry.   motor strength 5/5 in bilateral deltoid, bicep, tricep, grip, hip flexor, knee extensors, ankle dorsiflexor plantar flexor Sensation reported equal both upper and both lower extremities to light touch Tone is normal in both upper and both lower extremities Decreased fine motor in the right upper extremity Minimal dysmetria in the right upper and the right lower extremity  No results found for this or any previous visit (from the past 24 hour(s)). US Biopsy  01/06/2013   CLINICAL DATA:  Abnormal right supraclavicular lymph node.  EXAM: ULTRASOUND GUIDED core BIOPSY OF a right supraclavicular lymph node  COMPLICATIONS: None.  FINDINGS: Images document needle placement in the right supraclavicular lymph node. Post biopsy images demonstrate no evidence of hemorrhage.  IMPRESSION: Successful right supraclavicular lymph node core biopsy.  : PROCEDURE:  The procedure, risks, benefits, and alternatives were explained to the patient. Questions regarding the procedure were encouraged and answered. The patient understands and consents to the procedure.  The right neck was prepped with Betadine in a sterile fashion, and a sterile drape was applied covering the operative field. A sterile gown and sterile  gloves were used for the procedure. Local anesthesia was provided with 1% Lidocaine.  Under sonographic guidance, 3 18 gauge core biopsies of the enlarged right supraclavicular lymph node were obtained without complication. Post biopsy imaging  was performed.   Electronically Signed   By: [name] M.D.   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 Does the need for close, 24 hr/day medical supervision in concert with the patient's rehab needs make it unreasonable for this patient to be served in a less intensive setting? Potentially 2. Co-Morbidities requiring supervision/potential complications: lung CA, seizure D/O 3. Due to bladder management, bowel management, safety, skin/wound care, disease management, medication administration, pain management and patient education, does the patient require 24 hr/day rehab nursing? Potentially 4. Does the patient require coordinated care of a physician, rehab nurse, PT (1-2 hrs/day, 5 days/week) and OT (1-2 hrs/day, 5 days/week) to address physical and functional deficits in the context of the above medical diagnosis(es)? Potentially Addressing deficits in the following areas: balance, locomotion, transferring, bathing, dressing and toileting 5. Can the patient actively participate in an intensive therapy program of at least 3 hrs of therapy per day at least 5 days per week? Yes 6. The potential for patient to make measurable gains while on inpatient rehab is excellent 7. Anticipated functional outcomes upon discharge from inpatient rehab are supervision to modified independent with PT, supervision to modified independent with OT, not applicable with SLP. 8. Estimated rehab length of stay to reach the above functional goals is: 7-10 days 9. Does the patient have adequate social supports to  accommodate these discharge functional goals? Yes 10. Anticipated D/C setting: Home 11. Anticipated post D/C treatments: [facility] 12. Overall Rehab/Functional Prognosis: good  RECOMMENDATIONS: This patient's condition is appropriate for continued rehabilitative care in the following setting: Patient would benefit from CIR however does not wish to stay in the hospital. He states he has adequate help at home from family members Patient has agreed to participate in recommended program. No Note that insurance prior authorization may be required for reimbursement for recommended care.  Comment:     01/07/2013 Toward

## 2013-01-07 NOTE — Progress Notes (Signed)
[facility]    Date of Admission:  01/02/2013   Total days of antibiotics 0           ID: Carlos Beasley is a 68 y.o. male with new onset seizure found to have brain mass concerning for brain abscess vs. Metastatic malignancy  Active Problems:   Stroke   Seizure cerebral   Lung mass   Mediastinal lymphadenopathy    Subjective: Afebrile and no new seizures; underwent US guided LN biopsy yesterday. Complains of dry cough  Medications:  . amLODipine  10 mg Oral Daily  . antiseptic oral rinse  15 mL Mouth Rinse q12n4p  . aspirin EC  325 mg Oral Daily  . chlorhexidine  15 mL Mouth Rinse BID  . dexamethasone  4 mg Oral Q6H  . dextromethorphan  30 mg Oral BID  . enoxaparin (LOVENOX) injection  40 mg Subcutaneous Q24H  . hydrALAZINE  25 mg Oral Q8H  . levETIRAcetam  1,000 mg Oral BID  . lisinopril  5 mg Oral Daily  . LORazepam  1 mg Intravenous Once  . nicotine  21 mg Transdermal Daily  . pantoprazole  40 mg Oral Daily    Objective: Vital signs in last 24 hours: Temp:  [97.6 F (36.4 C)-98.6 F (37 C)] 97.8 F (36.6 C) (01/07 0754) Pulse Rate:  [57-63] 62 (01/07 0754) Resp:  [17-18] 18 (01/07 0417) BP: (127-150)/(70-88) 143/88 mmHg (01/07 0754) SpO2:  [97 %-100 %] 100 % (01/07 0417)  Physical Exam  Constitutional: He is oriented to person, place, and time. He appears well-developed and well-nourished. No distress.  HENT:  Mouth/Throat: Oropharynx is clear and moist. No oropharyngeal exudate. Poor dentition Cardiovascular: Normal rate, regular rhythm and normal heart sounds. Exam reveals no gallop and no friction rub.  No murmur heard.  Pulmonary/Chest: Effort normal and breath sounds normal. No respiratory distress. He has no wheezes.  Abdominal: Soft. Bowel sounds are normal. He exhibits no distension. There is no tenderness.  Lymphadenopathy:  He has no cervical adenopathy.  Neurological: He is alert and oriented to person, place, and  time.  Skin: Skin is warm and dry. No rash noted. No erythema.  Psychiatric: He has a normal mood and affect. His behavior is normal.   Lab Results No results found for this basename: WBC, HGB, HCT, PLATELETS, NA, K, CL, CO2, BUN, CREATININE, GLU,  in the last 72 hours  Microbiology: 1/4 blood cx NGTD Studies/Results: US Biopsy  01/06/2013   CLINICAL DATA:  Abnormal right supraclavicular lymph node.  EXAM: ULTRASOUND GUIDED core BIOPSY OF a right supraclavicular lymph node  COMPLICATIONS: None.  FINDINGS: Images document needle placement in the right supraclavicular lymph node. Post biopsy images demonstrate no evidence of hemorrhage.  IMPRESSION: Successful right supraclavicular lymph node core biopsy.  : PROCEDURE:  The procedure, risks, benefits, and alternatives were explained to the patient. Questions regarding the procedure were encouraged and answered. The patient understands and consents to the procedure.  The right neck was prepped with Betadine in a sterile fashion, and a sterile drape was applied covering the operative field. A sterile gown and sterile gloves were used for the procedure. Local anesthesia was provided with 1% Lidocaine.  Under sonographic guidance, 3 18 gauge core biopsies of the enlarged right supraclavicular lymph node were obtained without complication. Post biopsy imaging was performed.   Electronically Signed   By: [name] Beasley.D.   On: 01/06/2013 15:04     Assessment/Plan:  Carlos Beasley with new onset seizure 2/2 brain mass concern for brain abscess vs. Metastatic lung disease. LN biopsy done on 1/6 await results.  - continue to hold off on antibiotics, favor non-infectious process but still need tissue sent for culture if path is negative  [facility], [facility] Cell: [phone] Pager: [phone]  01/07/2013, 11:06 AM

## 2013-01-07 NOTE — Progress Notes (Signed)
Dr. Brand Males, M.D., [facility].C.P Pulmonary and Critical Care Medicine Staff Physician [facility] Pager: [phone], If no answer or between  15:00h - 7:00h: call [phone]  01/07/2013 11:51 PM

## 2013-01-07 NOTE — Progress Notes (Signed)
Occupational Therapy Treatment Patient Details Name: Carlos Beasley MRN: [id number] DOB: February 19, 1945 Today's Date: 01/07/2013 Time: 3235-5732 OT Time Calculation (min): 24 min  OT Assessment / Plan / Recommendation  History of present illness Pt admitted with new onset of seizure.  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 malignancy.   OT comments  Pt making progress with functional goals and spoke with pt about CIR after acute care stay. Pt continues to demo, decreased attention to R side, unsafe mobility/decreased awareness of deficits and decreased coordination of R UE and R LE. Pt to continue with acute OT services to address impairments to increase level of function and safety  Follow Up Recommendations  CIR    Barriers to Discharge       Equipment Recommendations  Other (comment);None recommended by OT (TBD at next venue of care)    Recommendations for Other Services    Frequency Min 2X/week   Progress towards OT Goals Progress towards OT goals: Progressing toward goals  Plan Discharge plan needs to be updated    Precautions / Restrictions Precautions Precautions: Fall Precaution Comments: right sided weakness and inattention.  Restrictions Weight Bearing Restrictions: No   Pertinent Vitals/Pain No c/o pain    ADL  Grooming: Wash/dry hands;Wash/dry face;Min guard;Teeth care Toilet Transfer: Performed;Min guard;Minimal assistance Toilet Transfer Method: Sit to stand Toilet Transfer Equipment: Regular height toilet;Grab bars Toileting - Clothing Manipulation and Hygiene: Performed;Min guard Where Assessed - Best boy and Hygiene: Standing Tub/Shower Transfer: Performed;Simulated;Minimal assistance Warden/ranger: Grab bars;Walk in shower;Other (comment) (simulated threshold height for tub shower with trashcan ) Transfers/Ambulation Related to ADLs: ambulated with min A - min guard A for balance, no AD. Holding IV  pole too far to L side ADL Comments: Pt limited by decreased coordination on R side and balance deficits. Pt states that his R UE has improved since yesterday. Decreased incoordination on R hand observed during functional tasks and gripping/pincing small items from tabletop    OT Diagnosis:    OT Problem List:   OT Treatment Interventions:     OT Goals(current goals can now be found in the care plan section)    Visit Information  Last OT Received On: 01/07/13 Assistance Needed: +1 History of Present Illness: Pt admitted with new onset of seizure.  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 malignancy.    Subjective Data      Prior Functioning       Cognition  Cognition Arousal/Alertness: Awake/alert Behavior During Therapy: WFL for tasks assessed/performed Overall Cognitive Status: Impaired/Different from baseline Area of Impairment: Safety/judgement;Awareness Safety/Judgement: Decreased awareness of safety;Decreased awareness of deficits    Mobility  Bed Mobility Overal bed mobility: Modified Independent Transfers Overall transfer level: Needs assistance Transfers: Sit to/from Stand Sit to Stand: Min assist;Min guard    Exercises      Balance Balance Overall balance assessment: Needs assistance Standing balance support: Single extremity supported;During functional activity  End of Session OT - End of Session Activity Tolerance: Patient tolerated treatment well Patient left: in bed;Other (comment) (sitting EOB, set up for bathing)  GO     Carlos Beasley 01/07/2013, 1:34 PM

## 2013-01-08 DIAGNOSIS — R911 Solitary pulmonary nodule: Secondary | ICD-10-CM

## 2013-01-08 DIAGNOSIS — G40802 Other epilepsy, not intractable, without status epilepticus: Secondary | ICD-10-CM

## 2013-01-08 DIAGNOSIS — C7931 Secondary malignant neoplasm of brain: Principal | ICD-10-CM

## 2013-01-08 DIAGNOSIS — C7949 Secondary malignant neoplasm of other parts of nervous system: Principal | ICD-10-CM

## 2013-01-08 LAB — CBC WITH DIFFERENTIAL/PLATELET
BASOS ABS: 0 10*3/uL (ref 0.0–0.1)
BASOS PCT: 0 % (ref 0–1)
EOS ABS: 0 10*3/uL (ref 0.0–0.7)
EOS PCT: 0 % (ref 0–5)
HCT: 38.2 % — ABNORMAL LOW (ref 39.0–52.0)
Hemoglobin: 12.7 g/dL — ABNORMAL LOW (ref 13.0–17.0)
LYMPHS PCT: 7 % — AB (ref 12–46)
Lymphs Abs: 0.6 10*3/uL — ABNORMAL LOW (ref 0.7–4.0)
MCH: 29.9 pg (ref 26.0–34.0)
MCHC: 33.2 g/dL (ref 30.0–36.0)
MCV: 89.9 fL (ref 78.0–100.0)
MONO ABS: 0.5 10*3/uL (ref 0.1–1.0)
Monocytes Relative: 6 % (ref 3–12)
Neutro Abs: 7.3 10*3/uL (ref 1.7–7.7)
Neutrophils Relative %: 87 % — ABNORMAL HIGH (ref 43–77)
PLATELETS: 216 10*3/uL (ref 150–400)
RBC: 4.25 MIL/uL (ref 4.22–5.81)
RDW: 14.2 % (ref 11.5–15.5)
WBC: 8.5 10*3/uL (ref 4.0–10.5)

## 2013-01-08 LAB — COMPREHENSIVE METABOLIC PANEL
ALBUMIN: 2.9 g/dL — AB (ref 3.5–5.2)
ALT: 20 U/L (ref 0–53)
AST: 14 U/L (ref 0–37)
Alkaline Phosphatase: 54 U/L (ref 39–117)
BUN: 15 mg/dL (ref 6–23)
CALCIUM: 8.5 mg/dL (ref 8.4–10.5)
CO2: 23 meq/L (ref 19–32)
CREATININE: 1.03 mg/dL (ref 0.50–1.35)
Chloride: 101 mEq/L (ref 96–112)
GFR calc Af Amer: 85 mL/min — ABNORMAL LOW (ref >90)
GFR, EST NON AFRICAN AMERICAN: 73 mL/min — AB (ref >90)
Glucose, Bld: 169 mg/dL — ABNORMAL HIGH (ref 70–99)
Potassium: 4.3 mEq/L (ref 3.7–5.3)
SODIUM: 136 meq/L — AB (ref 137–147)
Total Bilirubin: <0.2 mg/dL — ABNORMAL LOW (ref 0.3–1.2)
Total Protein: 6.6 g/dL (ref 6.0–8.3)

## 2013-01-08 NOTE — Progress Notes (Addendum)
PATIENT DETAILS Name: Carlos Beasley Age: 68 y.o. Sex: male Date of Birth: [birth year] Admit Date: 01/02/2013 Admitting Physician Reyne Dumas, MD PCP:No primary provider on file.  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 - Radiation oncology on board-plans are for outpatient radiation -Given seizures and right-sided weakness which have significantly improved-continue Keppra and Decadron  Seizures - Secondary to above - On Keppra and Decadron-continue  Acute encephalopathy - Suspected secondary to postictal state, brain metastases - Resolved  Right-sided weakness - Secondary to metastatic brain lesions - Better with Decadron - Continue with PT/OT  Diffuse hilar and mediastinal lymphadenopathy/supraclavicular lymphadenopathy with spiculated right upper lobe lung nodule- suspected adenocarcinoma of lung - CT chest that did show a small right upper lobe spiculated nodule - Biopsy of right supraclavicular lymph node by interventional radiology on 1/6-positive for metastatic adenocarcinoma - Have consulted oncology (Dr. Humphrey Rolls)  Hypertension - Continue with lisinopril and amlodipine  Tobacco abuse - Counseled, continue with transdermal nicotine  Disposition: Remain inpatient- suspect home with home health services and 1-2 days  DVT Prophylaxis: Prophylactic Lovenox   Code Status: Full code   Family Communication None  Procedures:  None  CONSULTS:  pulmonary/intensive care, hematology/oncology and IR, Rad Onc, Neurosurgery   MEDICATIONS: Scheduled Meds: . amLODipine  10 mg Oral Daily  . antiseptic oral rinse  15 mL Mouth Rinse q12n4p  . aspirin EC  325 mg Oral Daily  . chlorhexidine  15 mL Mouth Rinse BID  . dexamethasone  4 mg Oral Q6H  . dextromethorphan  30 mg Oral BID  . enoxaparin (LOVENOX) injection  40 mg Subcutaneous  Q24H  . hydrALAZINE  25 mg Oral Q8H  . levETIRAcetam  1,000 mg Oral BID  . lisinopril  5 mg Oral Daily  . LORazepam  1 mg Intravenous Once  . nicotine  21 mg Transdermal Daily  . pantoprazole  40 mg Oral Daily   Continuous Infusions: . sodium chloride 10 mL/hr (01/07/13 1807)   PRN Meds:.acetaminophen, acetaminophen, hydrALAZINE, LORazepam, ondansetron (ZOFRAN) IV, ondansetron, sodium chloride, zolpidem  Antibiotics: Anti-infectives   None       PHYSICAL EXAM: Vital signs in last 24 hours: Filed Vitals:   01/07/13 1943 01/08/13 0000 01/08/13 0400 01/08/13 1151  BP: 146/88 155/91 136/72 139/79  Pulse: 66 60 55 55  Temp: 97.5 F (36.4 C) 98.3 F (36.8 C) 97.9 F (36.6 C) 97.7 F (36.5 C)  TempSrc: Oral Oral Oral Oral  Resp:  18 18 16   Height:      Weight:      SpO2: 100% 99% 100% 100%    Weight change:  Filed Weights   01/02/13 1800 01/05/13 0435  Weight: 71 kg (156 lb 8.4 oz) 73 kg (160 lb 15 oz)   Body mass index is 19.6 kg/(m^2).   Gen Exam: Awake and alert with clear speech.  Neck: Supple, No JVD.   Chest: B/L Clear.   CVS: S1 S2 Regular, no murmurs.  Abdomen: soft, BS +, non tender, non distended.  Extremities: no edema, lower extremities warm to touch. Neurologic: Minimal right-sided weakness Skin: No Rash.   Wounds: N/A.   Intake/Output from previous day:  Intake/Output Summary (Last 24 hours) at 01/08/13 1435 Last data filed at 01/08/13 0437  Gross per 24 hour  Intake    520 ml  Output   1560 ml  Net  -1040 ml     LAB RESULTS:  CBC  Recent Labs Lab 01/02/13 1410 01/02/13 1420 01/02/13 1600 01/03/13 0255  WBC 4.9  --  5.0 4.8  HGB 13.5 14.3 11.9* 12.6*  HCT 41.4 42.0 36.2* 39.0  PLT 238  --  225 238  MCV 90.6  --  91.2 90.3  MCH 29.5  --  30.0 29.2  MCHC 32.6  --  32.9 32.3  RDW 13.7  --  13.9 13.5  LYMPHSABS 2.3  --   --   --   MONOABS 0.4  --   --   --   EOSABS 0.0  --   --   --   BASOSABS 0.0  --   --   --     Chemistries    Recent Labs Lab 01/02/13 1410 01/02/13 1420 01/02/13 1600 01/03/13 0255  NA 143 143  --  142  K 4.8 4.6  --  4.9  CL 103 105  --  105  CO2 24  --   --  26  GLUCOSE 127* 127*  --  105*  BUN 19 22  --  18  CREATININE 1.65* 1.80*  --  1.42*  CALCIUM 9.3  --   --  8.7  MG  --   --  2.2  --     CBG:  Recent Labs Lab 01/02/13 1445 01/03/13 0605 01/04/13 0748  GLUCAP 112* 123* 84    GFR Estimated Creatinine Clearance: 52.1 ml/min (by C-G formula based on Cr of 1.42).  Coagulation profile  Recent Labs Lab 01/02/13 1410 01/02/13 1600  INR 1.01 1.02    Cardiac Enzymes  Recent Labs Lab 01/02/13 1600 01/02/13 2130 01/03/13 0255  TROPONINI <0.30 <0.30 <0.30    No components found with this basename: POCBNP,  No results found for this basename: DDIMER,  in the last 72 hours No results found for this basename: HGBA1C,  in the last 72 hours No results found for this basename: CHOL, HDL, LDLCALC, TRIG, CHOLHDL, LDLDIRECT,  in the last 72 hours No results found for this basename: TSH, T4TOTAL, FREET3, T3FREE, THYROIDAB,  in the last 72 hours No results found for this basename: VITAMINB12, FOLATE, FERRITIN, TIBC, IRON, RETICCTPCT,  in the last 72 hours No results found for this basename: LIPASE, AMYLASE,  in the last 72 hours  Urine Studies No results found for this basename: UACOL, UAPR, USPG, UPH, UTP, UGL, UKET, UBIL, UHGB, UNIT, UROB, ULEU, UEPI, UWBC, URBC, UBAC, CAST, CRYS, UCOM, BILUA,  in the last 72 hours  MICROBIOLOGY: Recent Results (from the past 240 hour(s))  MRSA PCR SCREENING     Status: None   Collection Time    01/02/13  5:56 PM      Result Value Range Status   MRSA by PCR NEGATIVE  NEGATIVE Final   Comment:            The GeneXpert MRSA Assay (FDA     approved for NASAL specimens     only), is one component of a     comprehensive MRSA colonization     surveillance program. It is not     intended to diagnose MRSA     infection nor to guide  or     monitor treatment for     MRSA infections.  CULTURE, BLOOD (ROUTINE X 2)     Status: None   Collection Time    01/04/13 11:00 AM      Result Value Range Status   Specimen Description BLOOD RIGHT ARM  Final   Special Requests     Final   Value: BOTTLES DRAWN AEROBIC AND ANAEROBIC 10CC BLUE, 5CC RED   Culture  Setup Time     Final   Value: 01/04/2013 19:28     Performed at [facility]   Culture     Final   Value:        BLOOD CULTURE RECEIVED NO GROWTH TO DATE CULTURE WILL BE HELD FOR 5 DAYS BEFORE ISSUING A FINAL NEGATIVE REPORT     Performed at [facility]   Report Status PENDING   Incomplete  CULTURE, BLOOD (ROUTINE X 2)     Status: None   Collection Time    01/04/13 11:10 AM      Result Value Range Status   Specimen Description BLOOD RIGHT ARM   Final   Special Requests BOTTLES DRAWN AEROBIC ONLY [facility]   Final   Culture  Setup Time     Final   Value: 01/04/2013 19:29     Performed at [facility]   Culture     Final   Value:        BLOOD CULTURE RECEIVED NO GROWTH TO DATE CULTURE WILL BE HELD FOR 5 DAYS BEFORE ISSUING A FINAL NEGATIVE REPORT     Performed at [facility]   Report Status PENDING   Incomplete  CULTURE, BLOOD (ROUTINE X 2)     Status: None   Collection Time    01/07/13 12:06 PM      Result Value Range Status   Specimen Description BLOOD RIGHT ARM   Final   Special Requests BOTTLES DRAWN AEROBIC ONLY 10CC   Final   Culture  Setup Time     Final   Value: 01/07/2013 16:30     Performed at [facility]   Culture     Final   Value:        BLOOD CULTURE RECEIVED NO GROWTH TO DATE CULTURE WILL BE HELD FOR 5 DAYS BEFORE ISSUING A FINAL NEGATIVE REPORT     Performed at [facility]   Report Status PENDING   Incomplete  CULTURE, BLOOD (ROUTINE X 2)     Status: None   Collection Time    01/07/13 12:15 PM      Result Value Range Status   Specimen Description BLOOD RIGHT FOREARM   Final   Special Requests  BOTTLES DRAWN AEROBIC ONLY [facility]   Final   Culture  Setup Time     Final   Value: 01/07/2013 16:30     Performed at [facility]   Culture     Final   Value:        BLOOD CULTURE RECEIVED NO GROWTH TO DATE CULTURE WILL BE HELD FOR 5 DAYS BEFORE ISSUING A FINAL NEGATIVE REPORT     Performed at [facility]   Report Status PENDING   Incomplete    RADIOLOGY STUDIES/RESULTS: Ct Head Wo Contrast  01/02/2013   CLINICAL DATA:  Right-sided weakness.  EXAM: CT HEAD WITHOUT CONTRAST  TECHNIQUE: Contiguous axial images were obtained from the base of the skull through the vertex without intravenous contrast.  COMPARISON:  None.  FINDINGS: Sinuses/Soft tissues: Clear paranasal sinuses and mastoid air cells.  Intracranial: Large area of left parietal hypoattenuation and mass effect. This causes partial effacement of the occipital horn of the left lateral ventricle. 1-2 mm of left-to-right midline shift.  No complicating hemorrhage.  Basal cisterns patent.  Apparent  hypoattenuation in the left cerebellar hemisphere on image 6/series 2 is favored to be a artifactual.  No hydrocephalus, intra-axial, or extra-axial fluid collection.  IMPRESSION: 1. Large area of edema within the left parietal lobe. Given the clinical history, this most likely represents a subacute infarct. Given the appearance, differential consideration includes a mass lesion such as a metastasis. There is mass effect and minimal midline shift. Critical test results telephoned to.Dr. Regenia Skeeter. at the time of interpretation at 2:30 p.m.on 01/02/2013. 2. Probable artifactual hypoattenuation in the left cerebellar hemisphere. If the patient undergoes MRI, recommend attention to this area.   Electronically Signed   By: Abigail Miyamoto M.D.   On: 01/02/2013 14:31   Ct Chest W Contrast  01/04/2013   CLINICAL DATA:  Brain lesion on comparison MRI. Concern for metastatic disease.  EXAM: CT CHEST, ABDOMEN, AND PELVIS WITH CONTRAST  TECHNIQUE:  Multidetector CT imaging of the chest, abdomen and pelvis was performed following the standard protocol during bolus administration of intravenous contrast.  CONTRAST:  162mL OMNIPAQUE IOHEXOL 300 MG/ML  SOLN  COMPARISON:  US SCROTUM dated 09/08/2007; MR HEAD WO/W CM dated 01/03/2013  FINDINGS: CT CHEST FINDINGS  There is little body fat in the patient's thorax . No axillary lymphadenopathy. Potential right supraclavicular lymph node measuring 16 mm (image number 1, series 5). There is enlarged right lower paratracheal lymph node measuring 15 mm short axis. Left lower paratracheal lymph node measures 12 mm. There is a large subcarinal lymph node measuring 17 mm short axis. Prominent right hilar lymph node measures 10 mm (image 25, series 2).  Review of the lung parenchyma demonstrates a spiculated nodule at the superior aspect of the right upper lobe measuring 7 x 7 mm (image 11, is series 6). This is also seen on image number 2 of series 4. No additional suspicious pulmonary nodules. Central airways are normal.  CT ABDOMEN AND PELVIS FINDINGS  Low-density lesion the posterior right hepatic lobe has simple fluid attenuation consistent with benign cyst. 3 mm hypodense lesion in the central left hepatic lobe (image 52) is too small to characterize. The gallbladder, pancreas, spleen, adrenal glands, and kidneys are normal.  Stomach, small bowel, and colon are unremarkable.  Abdominal aorta is normal caliber. No retroperitoneal periportal lymphadenopathy.  No free fluid the pelvis. The prostate gland and bladder normal. No pelvic lymphadenopathy. No aggressive osseous lesion.  IMPRESSION: 1. Mediastinal lymphadenopathy is concerning for metastatic adenopathy. 2. Small right upper lobe spiculated nodule may represent primary bronchogenic carcinoma. 3. Potential right supraclavicular enlarged lymph node. Ultrasound evaluation could delineate this lymph node. 4. Low-density lesions in the liver likely represent benign cysts.  The smaller cyst is too small to characterize. 5. Patient may ultimately benefit from an outpatient FDG PET-CT staging exam.   Electronically Signed   By: Suzy Bouchard M.D.   On: 01/04/2013 15:20   Mr Jeri Cos NA Contrast  01/03/2013   CLINICAL DATA:  Seizure activity. History of drug abuse. Right-sided weakness.  EXAM: MRI HEAD WITHOUT AND WITH CONTRAST  TECHNIQUE: Multiplanar, multiecho pulse sequences of the brain and surrounding structures were obtained without and with intravenous contrast.  CONTRAST:  50mL MULTIHANCE GADOBENATE DIMEGLUMINE 529 MG/ML IV SOLN  COMPARISON:  Head CT 01/02/2013  FINDINGS: There is no evidence of ischemic infarction. Within the left parietal lobe, there is an 11 x 13 mm enhancing focus with pronounced regional vasogenic edema. A 2nd enhancing focus is present within the inferior cerebellum on the left measuring 6  mm in diameter. There is minimal edema in that region. Elsewhere, there are a few punctate foci of T2 and FLAIR signal in the hemispheric white matter not associated with abnormal enhancement and most likely indicative of old small vessel insults. There is a small focus of hemorrhage within the region of edema in the left parietal lobe. This is no larger than 3-4 mm. No hydrocephalus or extra-axial collection. Because of the edema, there is mass-effect on the posterior aspect of the left lateral ventricle. No midline shift. No pituitary mass. Sinuses are clear. No skull or skullbase lesion.  IMPRESSION: 11 x 13 mm enhancing lesion in the left parietal lobe with pronounced regional vasogenic edema. Second enhancing focus in the inferior cerebellum on the left measuring 6 mm in diameter with mild regional edema. The differential diagnosis is metastatic disease versus areas of focal cerebritis. Given the history of drug abuse, infectious cerebritis is certainly possible. The amount of vasogenic edema associated with the left parietal lesion is extreme, a finding often  associated with acute cerebritis. .   Electronically Signed   By: Nelson Chimes M.D.   On: 01/03/2013 19:30   Ct Abdomen Pelvis W Contrast  01/04/2013   CLINICAL DATA:  Brain lesion on comparison MRI. Concern for metastatic disease.  EXAM: CT CHEST, ABDOMEN, AND PELVIS WITH CONTRAST  TECHNIQUE: Multidetector CT imaging of the chest, abdomen and pelvis was performed following the standard protocol during bolus administration of intravenous contrast.  CONTRAST:  11mL OMNIPAQUE IOHEXOL 300 MG/ML  SOLN  COMPARISON:  US SCROTUM dated 09/08/2007; MR HEAD WO/W CM dated 01/03/2013  FINDINGS: CT CHEST FINDINGS  There is little body fat in the patient's thorax . No axillary lymphadenopathy. Potential right supraclavicular lymph node measuring 16 mm (image number 1, series 5). There is enlarged right lower paratracheal lymph node measuring 15 mm short axis. Left lower paratracheal lymph node measures 12 mm. There is a large subcarinal lymph node measuring 17 mm short axis. Prominent right hilar lymph node measures 10 mm (image 25, series 2).  Review of the lung parenchyma demonstrates a spiculated nodule at the superior aspect of the right upper lobe measuring 7 x 7 mm (image 11, is series 6). This is also seen on image number 2 of series 4. No additional suspicious pulmonary nodules. Central airways are normal.  CT ABDOMEN AND PELVIS FINDINGS  Low-density lesion the posterior right hepatic lobe has simple fluid attenuation consistent with benign cyst. 3 mm hypodense lesion in the central left hepatic lobe (image 52) is too small to characterize. The gallbladder, pancreas, spleen, adrenal glands, and kidneys are normal.  Stomach, small bowel, and colon are unremarkable.  Abdominal aorta is normal caliber. No retroperitoneal periportal lymphadenopathy.  No free fluid the pelvis. The prostate gland and bladder normal. No pelvic lymphadenopathy. No aggressive osseous lesion.  IMPRESSION: 1. Mediastinal lymphadenopathy is concerning  for metastatic adenopathy. 2. Small right upper lobe spiculated nodule may represent primary bronchogenic carcinoma. 3. Potential right supraclavicular enlarged lymph node. Ultrasound evaluation could delineate this lymph node. 4. Low-density lesions in the liver likely represent benign cysts. The smaller cyst is too small to characterize. 5. Patient may ultimately benefit from an outpatient FDG PET-CT staging exam.   Electronically Signed   By: Suzy Bouchard M.D.   On: 01/04/2013 15:20   US Biopsy  01/06/2013   CLINICAL DATA:  Abnormal right supraclavicular lymph node.  EXAM: ULTRASOUND GUIDED core BIOPSY OF a right supraclavicular lymph node  COMPLICATIONS:  None.  FINDINGS: Images document needle placement in the right supraclavicular lymph node. Post biopsy images demonstrate no evidence of hemorrhage.  IMPRESSION: Successful right supraclavicular lymph node core biopsy.  : PROCEDURE:  The procedure, risks, benefits, and alternatives were explained to the patient. Questions regarding the procedure were encouraged and answered. The patient understands and consents to the procedure.  The right neck was prepped with Betadine in a sterile fashion, and a sterile drape was applied covering the operative field. A sterile gown and sterile gloves were used for the procedure. Local anesthesia was provided with 1% Lidocaine.  Under sonographic guidance, 3 18 gauge core biopsies of the enlarged right supraclavicular lymph node were obtained without complication. Post biopsy imaging was performed.   Electronically Signed   By: [name] M.D.   On: 01/06/2013 15:04    Oren Binet, MD  [facility] Pager:[phone]  If 7PM-7AM, please contact night-coverage [email or web address] Password [email or web address] 01/08/2013, 2:35 PM   LOS: 6 days

## 2013-01-08 NOTE — Progress Notes (Signed)
Rehab admissions - Evaluated for possible admission.  Per rehab consult, patient does not wish to stay in the hospital for rehab.  He wishes to discharge directly home when medically ready.  Call me for questions.  #[phone]

## 2013-01-08 NOTE — Progress Notes (Signed)
Patient ID: Carlos Beasley, male   DOB: 02-11-1945, 67 y.o.   MRN: [id number] Pathology results appear consistent with metastatic adenocarcinoma from the lung..would favor consideration of stereotactic radiosurgery for intracranial lesions when deemed medically appropriate.

## 2013-01-08 NOTE — Progress Notes (Signed)
The Chaplain stopped by to visit with a patient for an initial visit but the patient was resting. A follow up visit with the patient will be carried out by a Chaplain.  Chaplain Clista Bernhardt Ettie Krontz

## 2013-01-08 NOTE — Consult Note (Signed)
Radiation Oncology         [phone] ________________________________  Name: Carlos Beasley MRN: [id number]  Date: 01/02/2013  DOB: [birth year]   INPATIENT    REFERRING PHYSICIAN: Debbe Odea, MD   DIAGNOSIS: The primary encounter diagnosis was CVA (cerebral infarction). Diagnoses of Partial seizure, Cocaine abuse, HTN (hypertension), Chest pain, Congestive dilated cardiomyopathy, Seizure cerebral, Brain mass, Lesion of lung, Lymphadenopathy, Lung mass, Mediastinal lymphadenopathy, and Tobacco abuse were also pertinent to this visit.   HISTORY OF PRESENT ILLNESS::Carlos Beasley is a 68 y.o. male who is seen for an initial consultation visit.  The patient presented daily emergency room on 01/02/2013 with some right-sided weakness. He was noted to have tonic-clonic movements of the right arm and right leg in the setting of significant hypertension. The patient was evaluated as a possible stroke they found. He underwent a CT scan of the head which was large area of edema in the left parietal lobe. This was felt to possibly represent a subacute infarct. Differential consideration also included a mass lesion. Hypoattenuation was also seen in an area within the left cerebellar hemisphere.  The patient's workup subsequently included an MRI scan of the brain. This revealed and 11 x 13 mm enhancing lesion within the left parietal lobe with pronounced regional vasogenic edema. A second enhancing focus was seen within the inferior cerebellum on the left measuring 6 mm with mild regional edema. Additional workup has included a CT scan of the chest abdomen and pelvis. Mediastinal adenopathy seen concerning for metastatic adenopathy in the presence of a small right upper lobe spiculated nodule. There is also a potential right supraclavicular enlarged lymph node and this was felt to be possibly able to be delineated 14 health. This has been selected for biopsy which was completed on 01/06/2013.  The patient  has also been seen by infectious disease. Workup negative thus far to account for findings although it is noted that the degree of edema is greater than expected for the size of potential metastases. The patient has been started on Decadron and is on Keppra. He states that he is doing better. Improving weakness in the right upper and lower extremities with him now describing baseline strength. No further seizures.   PREVIOUS RADIATION THERAPY: No   PAST MEDICAL HISTORY:  has a past medical history of Medical history non-contributory and Hypertension.     PAST SURGICAL HISTORY: Past Surgical History  Procedure Laterality Date  . No past surgeries       FAMILY HISTORY: family history includes Heart Problems in his mother.   SOCIAL HISTORY:  reports that he has been smoking Cigarettes.  He has a 12.5 pack-year smoking history. He has never used smokeless tobacco. He reports that he drinks alcohol. He reports that he uses illicit drugs (Cocaine).   ALLERGIES: Review of patient's allergies indicates no known allergies.   MEDICATIONS:  Current Facility-Administered Medications  Medication Dose Route Frequency Provider Last Rate Last Dose  . 0.9 %  sodium chloride infusion   Intravenous Continuous Debbe Odea, MD 10 mL/hr at 01/07/13 1807 10 mL/hr at 01/07/13 1807  . acetaminophen (TYLENOL) tablet 650 mg  650 mg Oral Q6H PRN Reyne Dumas, MD       Or  . acetaminophen (TYLENOL) suppository 650 mg  650 mg Rectal Q6H PRN Reyne Dumas, MD      . amLODipine (NORVASC) tablet 10 mg  10 mg Oral Daily Nishant Dhungel, MD   10 mg at 01/07/13 0954  .  antiseptic oral rinse (BIOTENE) solution 15 mL  15 mL Mouth Rinse q12n4p Jessica U Vann, DO   15 mL at 01/07/13 1618  . aspirin EC tablet 325 mg  325 mg Oral Daily Reyne Dumas, MD   325 mg at 01/07/13 1259  . chlorhexidine (PERIDEX) 0.12 % solution 15 mL  15 mL Mouth Rinse BID Geradine Girt, DO   15 mL at 01/07/13 2119  . dexamethasone (DECADRON)  tablet 4 mg  4 mg Oral Q6H Debbe Odea, MD   4 mg at 01/08/13 9628  . dextromethorphan (DELSYM) 30 MG/5ML liquid 30 mg  30 mg Oral BID Debbe Odea, MD   30 mg at 01/07/13 2120  . enoxaparin (LOVENOX) injection 40 mg  40 mg Subcutaneous Q24H Arty Baumgartner, RPH   40 mg at 01/07/13 0955  . hydrALAZINE (APRESOLINE) injection 10 mg  10 mg Intravenous Q6H PRN Reyne Dumas, MD      . hydrALAZINE (APRESOLINE) tablet 25 mg  25 mg Oral Q8H Nishant Dhungel, MD   25 mg at 01/08/13 3662  . levETIRAcetam (KEPPRA) tablet 1,000 mg  1,000 mg Oral BID Dorian Pod, MD   1,000 mg at 01/07/13 2120  . lisinopril (PRINIVIL,ZESTRIL) tablet 5 mg  5 mg Oral Daily Nishant Dhungel, MD   5 mg at 01/07/13 0954  . LORazepam (ATIVAN) injection 1 mg  1 mg Intravenous Once [employer], DO      . LORazepam (ATIVAN) injection 2 mg  2 mg Intravenous Q6H PRN Reyne Dumas, MD      . nicotine (NICODERM CQ - dosed in mg/24 hours) patch 21 mg  21 mg Transdermal Daily Nishant Dhungel, MD   21 mg at 01/07/13 0956  . ondansetron (ZOFRAN) tablet 4 mg  4 mg Oral Q6H PRN Reyne Dumas, MD       Or  . ondansetron (ZOFRAN) injection 4 mg  4 mg Intravenous Q6H PRN Reyne Dumas, MD      . pantoprazole (PROTONIX) EC tablet 40 mg  40 mg Oral Daily Nishant Dhungel, MD   40 mg at 01/07/13 1002  . sodium chloride (OCEAN) 0.65 % nasal spray 1 spray  1 spray Each Nare PRN Geradine Girt, DO   1 spray at 01/04/13 2208  . zolpidem (AMBIEN) tablet 5 mg  5 mg Oral QHS PRN Jeryl Columbia, NP   5 mg at 01/08/13 0004     REVIEW OF SYSTEMS:  A 15 point review of systems is documented in the electronic medical record. This was obtained by the nursing staff. However, I reviewed this with the patient to discuss relevant findings and make appropriate changes.  Pertinent items are noted in HPI.    PHYSICAL EXAM:  height is 6\' 4"  (1.93 m) and weight is 160 lb 15 oz (73 kg). His oral temperature is 97.9 F (36.6 C). His blood pressure is 136/72 and  his pulse is 55. His respiration is 18 and oxygen saturation is 100%.   ECOG = 1  0 - Asymptomatic (Fully active, able to carry on all predisease activities without restriction)  1 - Symptomatic but completely ambulatory (Restricted in physically strenuous activity but ambulatory and able to carry out work of a light or sedentary nature. For example, light housework, office work)  2 - Symptomatic, <50% in bed during the day (Ambulatory and capable of all self care but unable to carry out any work activities. Up and about more than 50% of waking hours)  3 - Symptomatic, >50% in bed, but not bedbound (Capable of only limited self-care, confined to bed or chair 50% or more of waking hours)  4 - Bedbound (Completely disabled. Cannot carry on any self-care. Totally confined to bed or chair)  5 - Death   Eustace Pen MM, Creech RH, Tormey DC, et al. [phone]). "Toxicity and response criteria of the [facility]". [place] Oncol. 5 (6): 649-55  General: Well-developed, in no acute distress HEENT: Normocephalic, atraumatic Cardiovascular: Regular rate and rhythm Respiratory: Clear to auscultation bilaterally GI: Soft, nontender, normal bowel sounds Extremities: No edema present Neuro: No focal deficits     LABORATORY DATA:  Lab Results  Component Value Date   WBC 4.8 01/03/2013   HGB 12.6* 01/03/2013   HCT 39.0 01/03/2013   MCV 90.3 01/03/2013   PLT 238 01/03/2013   Lab Results  Component Value Date   NA 142 01/03/2013   K 4.9 01/03/2013   CL 105 01/03/2013   CO2 26 01/03/2013   Lab Results  Component Value Date   ALT 9 01/03/2013   AST 14 01/03/2013   ALKPHOS 45 01/03/2013   BILITOT <0.2* 01/03/2013      RADIOGRAPHY: Ct Head Wo Contrast  01/02/2013   CLINICAL DATA:  Right-sided weakness.  EXAM: CT HEAD WITHOUT CONTRAST  TECHNIQUE: Contiguous axial images were obtained from the base of the skull through the vertex without intravenous contrast.  COMPARISON:  None.  FINDINGS:  Sinuses/Soft tissues: Clear paranasal sinuses and mastoid air cells.  Intracranial: Large area of left parietal hypoattenuation and mass effect. This causes partial effacement of the occipital horn of the left lateral ventricle. 1-2 mm of left-to-right midline shift.  No complicating hemorrhage.  Basal cisterns patent.  Apparent hypoattenuation in the left cerebellar hemisphere on image 6/series 2 is favored to be a artifactual.  No hydrocephalus, intra-axial, or extra-axial fluid collection.  IMPRESSION: 1. Large area of edema within the left parietal lobe. Given the clinical history, this most likely represents a subacute infarct. Given the appearance, differential consideration includes a mass lesion such as a metastasis. There is mass effect and minimal midline shift. Critical test results telephoned to.Dr. Regenia Skeeter. at the time of interpretation at 2:30 p.m.on 01/02/2013. 2. Probable artifactual hypoattenuation in the left cerebellar hemisphere. If the patient undergoes MRI, recommend attention to this area.   Electronically Signed   By: Abigail Miyamoto M.D.   On: 01/02/2013 14:31   Ct Chest W Contrast  01/04/2013   CLINICAL DATA:  Brain lesion on comparison MRI. Concern for metastatic disease.  EXAM: CT CHEST, ABDOMEN, AND PELVIS WITH CONTRAST  TECHNIQUE: Multidetector CT imaging of the chest, abdomen and pelvis was performed following the standard protocol during bolus administration of intravenous contrast.  CONTRAST:  16mL OMNIPAQUE IOHEXOL 300 MG/ML  SOLN  COMPARISON:  US SCROTUM dated 09/08/2007; MR HEAD WO/W CM dated 01/03/2013  FINDINGS: CT CHEST FINDINGS  There is little body fat in the patient's thorax . No axillary lymphadenopathy. Potential right supraclavicular lymph node measuring 16 mm (image number 1, series 5). There is enlarged right lower paratracheal lymph node measuring 15 mm short axis. Left lower paratracheal lymph node measures 12 mm. There is a large subcarinal lymph node measuring 17 mm  short axis. Prominent right hilar lymph node measures 10 mm (image 25, series 2).  Review of the lung parenchyma demonstrates a spiculated nodule at the superior aspect of the right upper lobe measuring 7 x 7 mm (image  11, is series 6). This is also seen on image number 2 of series 4. No additional suspicious pulmonary nodules. Central airways are normal.  CT ABDOMEN AND PELVIS FINDINGS  Low-density lesion the posterior right hepatic lobe has simple fluid attenuation consistent with benign cyst. 3 mm hypodense lesion in the central left hepatic lobe (image 52) is too small to characterize. The gallbladder, pancreas, spleen, adrenal glands, and kidneys are normal.  Stomach, small bowel, and colon are unremarkable.  Abdominal aorta is normal caliber. No retroperitoneal periportal lymphadenopathy.  No free fluid the pelvis. The prostate gland and bladder normal. No pelvic lymphadenopathy. No aggressive osseous lesion.  IMPRESSION: 1. Mediastinal lymphadenopathy is concerning for metastatic adenopathy. 2. Small right upper lobe spiculated nodule may represent primary bronchogenic carcinoma. 3. Potential right supraclavicular enlarged lymph node. Ultrasound evaluation could delineate this lymph node. 4. Low-density lesions in the liver likely represent benign cysts. The smaller cyst is too small to characterize. 5. Patient may ultimately benefit from an outpatient FDG PET-CT staging exam.   Electronically Signed   By: Suzy Bouchard M.D.   On: 01/04/2013 15:20   Mr Jeri Cos WU Contrast  01/03/2013   CLINICAL DATA:  Seizure activity. History of drug abuse. Right-sided weakness.  EXAM: MRI HEAD WITHOUT AND WITH CONTRAST  TECHNIQUE: Multiplanar, multiecho pulse sequences of the brain and surrounding structures were obtained without and with intravenous contrast.  CONTRAST:  70mL MULTIHANCE GADOBENATE DIMEGLUMINE 529 MG/ML IV SOLN  COMPARISON:  Head CT 01/02/2013  FINDINGS: There is no evidence of ischemic infarction.  Within the left parietal lobe, there is an 11 x 13 mm enhancing focus with pronounced regional vasogenic edema. A 2nd enhancing focus is present within the inferior cerebellum on the left measuring 6 mm in diameter. There is minimal edema in that region. Elsewhere, there are a few punctate foci of T2 and FLAIR signal in the hemispheric white matter not associated with abnormal enhancement and most likely indicative of old small vessel insults. There is a small focus of hemorrhage within the region of edema in the left parietal lobe. This is no larger than 3-4 mm. No hydrocephalus or extra-axial collection. Because of the edema, there is mass-effect on the posterior aspect of the left lateral ventricle. No midline shift. No pituitary mass. Sinuses are clear. No skull or skullbase lesion.  IMPRESSION: 11 x 13 mm enhancing lesion in the left parietal lobe with pronounced regional vasogenic edema. Second enhancing focus in the inferior cerebellum on the left measuring 6 mm in diameter with mild regional edema. The differential diagnosis is metastatic disease versus areas of focal cerebritis. Given the history of drug abuse, infectious cerebritis is certainly possible. The amount of vasogenic edema associated with the left parietal lesion is extreme, a finding often associated with acute cerebritis. .   Electronically Signed   By: Nelson Chimes M.D.   On: 01/03/2013 19:30   Ct Abdomen Pelvis W Contrast  01/04/2013   CLINICAL DATA:  Brain lesion on comparison MRI. Concern for metastatic disease.  EXAM: CT CHEST, ABDOMEN, AND PELVIS WITH CONTRAST  TECHNIQUE: Multidetector CT imaging of the chest, abdomen and pelvis was performed following the standard protocol during bolus administration of intravenous contrast.  CONTRAST:  162mL OMNIPAQUE IOHEXOL 300 MG/ML  SOLN  COMPARISON:  US SCROTUM dated 09/08/2007; MR HEAD WO/W CM dated 01/03/2013  FINDINGS: CT CHEST FINDINGS  There is little body fat in the patient's thorax . No  axillary lymphadenopathy. Potential right supraclavicular lymph  node measuring 16 mm (image number 1, series 5). There is enlarged right lower paratracheal lymph node measuring 15 mm short axis. Left lower paratracheal lymph node measures 12 mm. There is a large subcarinal lymph node measuring 17 mm short axis. Prominent right hilar lymph node measures 10 mm (image 25, series 2).  Review of the lung parenchyma demonstrates a spiculated nodule at the superior aspect of the right upper lobe measuring 7 x 7 mm (image 11, is series 6). This is also seen on image number 2 of series 4. No additional suspicious pulmonary nodules. Central airways are normal.  CT ABDOMEN AND PELVIS FINDINGS  Low-density lesion the posterior right hepatic lobe has simple fluid attenuation consistent with benign cyst. 3 mm hypodense lesion in the central left hepatic lobe (image 52) is too small to characterize. The gallbladder, pancreas, spleen, adrenal glands, and kidneys are normal.  Stomach, small bowel, and colon are unremarkable.  Abdominal aorta is normal caliber. No retroperitoneal periportal lymphadenopathy.  No free fluid the pelvis. The prostate gland and bladder normal. No pelvic lymphadenopathy. No aggressive osseous lesion.  IMPRESSION: 1. Mediastinal lymphadenopathy is concerning for metastatic adenopathy. 2. Small right upper lobe spiculated nodule may represent primary bronchogenic carcinoma. 3. Potential right supraclavicular enlarged lymph node. Ultrasound evaluation could delineate this lymph node. 4. Low-density lesions in the liver likely represent benign cysts. The smaller cyst is too small to characterize. 5. Patient may ultimately benefit from an outpatient FDG PET-CT staging exam.   Electronically Signed   By: Suzy Bouchard M.D.   On: 01/04/2013 15:20   US Biopsy  01/06/2013   CLINICAL DATA:  Abnormal right supraclavicular lymph node.  EXAM: ULTRASOUND GUIDED core BIOPSY OF a right supraclavicular lymph node   COMPLICATIONS: None.  FINDINGS: Images document needle placement in the right supraclavicular lymph node. Post biopsy images demonstrate no evidence of hemorrhage.  IMPRESSION: Successful right supraclavicular lymph node core biopsy.  : PROCEDURE:  The procedure, risks, benefits, and alternatives were explained to the patient. Questions regarding the procedure were encouraged and answered. The patient understands and consents to the procedure.  The right neck was prepped with Betadine in a sterile fashion, and a sterile drape was applied covering the operative field. A sterile gown and sterile gloves were used for the procedure. Local anesthesia was provided with 1% Lidocaine.  Under sonographic guidance, 3 18 gauge core biopsies of the enlarged right supraclavicular lymph node were obtained without complication. Post biopsy imaging was performed.   Electronically Signed   By: [name] M.D.   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 Possible metastatic mediastinal lymphadenopathy seen on CT scan and a right supraclavicular lymph node has been biopsied. Pathology pending. Clinically the patient is doing well is 5 which resolved weakness and no further suggestion of seizures. He continues on Decadron.  I discussed with the patient that final pathology results would be very instructed. If the patient is proven to have a new diagnosis of malignancy, then the CNS findings are consistent with metastatic disease and the patient would be appropriate I believe to proceed with treatment. This would likely involve radiation treatment and the patient appears to be a good candidate for radiosurgery.  The patient would proceed with a  SRS-protocol brain MRI scan as an outpatient which we would order a and he also would be scheduled for  a CT simulation in our department, also as an outpatient. I would  anticipate potentially being able to proceed with treatment within the next one to 2 weeks and this would require one treatment targeting both of these areas in a focal manner.   The patient also would likely benefit from a PET scan as an outpatient and would need to be seen by medical oncology on a non-urgent basis if pathology returned positive for cancer.  If pathology is negative from the right supraclavicular lymph node, then would consider bronchoscopy with mediastinal biopsies.    ________________________________   Jodelle Gross, MD, PhD

## 2013-01-08 NOTE — Consult Note (Signed)
[place]  Telephone:(336) [place]                                MR#: [id number]  DOB: 1945/04/09                       CSN#: [id number]  Referring MD: Dr. Sheliah [facility]   Reason for Consult: Lung Cancer   HYQ:MVHQIO L Fellner is a 68 y.o. male.  Previously healthy who presented to the  emergency room on 01/02/2013 with right-sided weakness and  tonic-clonic seizures  at the right arm and right leg and  hypertensive urgency with systolic blood pressure >962.  CT scan of the head showed a  large area of edema in the left parietal lobe  felt to represent a subacute infarct versus  a mass lesion. Hypoattenuation was also seen in an area within the left cerebellar hemisphere.  Subsequent MRI   of the brain  revealed an 11 x 13 mm enhancing lesion within the left parietal lobe with pronounced regional vasogenic edema. A second enhancing focus was seen within the inferior cerebellum on the left measuring 6 mm with mild regional edema.  CT scan of the chest abdomen and pelvis with contrast showed Mediastinal adenopathy  And  a small right upper lobe spiculated nodule. 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   The patient has also been seen by infectious disease with negative workup. He received  Decadron and Keppra with improved  weakness in the right upper and lower extremities with him now describing baseline strength. No further seizures. Radiation Oncology (Dr. Lisbeth Renshaw) evaluated the patient, recommending possible radiosurgery, anticipating initiation of treatment in 2 weeks time. PET scan as an outpatient is recommended.    PMH:  Past Medical History  Diagnosis Date  . Medical history non-contributory   . Hypertension     Surgeries:  Past  Surgical History  Procedure Laterality Date  . No past surgeries      Allergies: No Known Allergies  Medications:   Prior to Admission:  No prescriptions prior to admission    XBM:[id number], acetaminophen, hydrALAZINE, LORazepam, ondansetron (ZOFRAN) IV, ondansetron, sodium chloride, zolpidem  ROS: Constitutional: Positive for weight loss. Negative for fever, chills or  night sweats.  Eyes: Negative for blurred vision and double vision.  Respiratory: Negative for productive cough. No hemoptysis.No hoarseness.No neck swelling.  Positive for shortness of breath on exertion. No pleuritic chest pain.  Cardiovascular: Negative for chest pain. No palpitations.  GI: Negative for  nausea, vomiting, diarrhea or constipation. No change in bowel caliber. No  Melena or hematochezia. No abdominal pain.  GU: Negative for hematuria. No loss of urinary control. No urinary retention. Skin: Negative for itching. No rash. No petechia. No easy  Bruising. Musculoskeletal: Denies back , arm pain.  Neurological: Neurological: Positive for dizziness, weakness and headaches.   Family History:    Family History  Problem Relation Age of Onset  . Heart Problems Mother       Social History:  reports that he has been smoking Cigarettes.  He has a 12.5 pack-year smoking history. He has never used smokeless tobacco. He reports that he drinks alcohol. He reports that he  uses illicit drugs (Cocaine). He has 2 children in good health  Physical Exam    Filed Vitals:   01/08/13 1151  BP: 139/79  Pulse: 55  Temp: 97.7 F (36.5 C)  Resp: 16     Filed Weights   01/02/13 1800 01/05/13 0435  Weight: 156 lb 8.4 oz (71 kg) 160 lb 15 oz (73 kg)   General: 62  -year-old  in no acute distress A. and O. x3  well-developed, well-nourished.  HEENT: Normocephalic, atraumatic, PERRLA. Sclerae anicteric. Oral cavity without thrush or lesions. NECK:supple. no thyromegaly, no cervical or supraclavicular adenopathy    LUNGS: clear bilaterally . No wheezing, rhonchi or rales. No axillary masses. BREASTS: not examined. CARDIOVASCULAR: regular rate and rhythm, no murmur , rubs or gallops ABDOMEN: soft nontender , bowel sounds x4. No HSM. No masses palpable.  GU/rectal: deferred. EXTREMITIES: no clubbing cyanosis or edema. No bruising or petechial rash MUSCULOSKELETAL: no spinal tenderness.  NEURO: Non Focal. No Horner's.   Labs:  CBC   Recent Labs Lab 01/02/13 1410 01/02/13 1420 01/02/13 1600 01/03/13 0255  WBC 4.9  --  5.0 4.8  HGB 13.5 14.3 11.9* 12.6*  HCT 41.4 42.0 36.2* 39.0  PLT 238  --  225 238  MCV 90.6  --  91.2 90.3  MCH 29.5  --  30.0 29.2  MCHC 32.6  --  32.9 32.3  RDW 13.7  --  13.9 13.5  LYMPHSABS 2.3  --   --   --   MONOABS 0.4  --   --   --   EOSABS 0.0  --   --   --   BASOSABS 0.0  --   --   --      CMP    Recent Labs Lab 01/02/13 1410 01/02/13 1420 01/02/13 1600 01/03/13 0255  NA 143 143  --  142  K 4.8 4.6  --  4.9  CL 103 105  --  105  CO2 24  --   --  26  GLUCOSE 127* 127*  --  105*  BUN 19 22  --  18  CREATININE 1.65* 1.80*  --  1.42*  CALCIUM 9.3  --   --  8.7  MG  --   --  2.2  --   AST 19  --  15 14  ALT 11  --  10 9  ALKPHOS 50  --  45 45  BILITOT 0.4  --  0.3 <0.2*        Component Value Date/Time   BILITOT <0.2* 01/03/2013 0255   BILIDIR <0.2 01/02/2013 1600   IBILI NOT CALCULATED 01/02/2013 1600      Recent Labs Lab 01/02/13 1410 01/02/13 1600  INR 1.01 1.02    No results found for this basename: DDIMER,  in the last 72 hours   Anemia panel:  No results found for this basename: VITAMINB12, FOLATE, FERRITIN, TIBC, IRON, RETICCTPCT,  in the last 72 hours   Imaging Studies:  Ct Head Wo Contrast  01/02/2013   CLINICAL DATA:  Right-sided weakness.  EXAM: CT HEAD WITHOUT CONTRAST  TECHNIQUE: Contiguous axial images were obtained from the base of the skull through the vertex without intravenous contrast.  COMPARISON:  None.   FINDINGS: Sinuses/Soft tissues: Clear paranasal sinuses and mastoid air cells.  Intracranial: Large area of left parietal hypoattenuation and mass effect. This causes partial effacement of the occipital horn of the left lateral ventricle. 1-2 mm of left-to-right midline shift.  No  complicating hemorrhage.  Basal cisterns patent.  Apparent hypoattenuation in the left cerebellar hemisphere on image 6/series 2 is favored to be a artifactual.  No hydrocephalus, intra-axial, or extra-axial fluid collection.  IMPRESSION: 1. Large area of edema within the left parietal lobe. Given the clinical history, this most likely represents a subacute infarct. Given the appearance, differential consideration includes a mass lesion such as a metastasis. There is mass effect and minimal midline shift. Critical test results telephoned to.Dr. Regenia Skeeter. at the time of interpretation at 2:30 p.m.on 01/02/2013. 2. Probable artifactual hypoattenuation in the left cerebellar hemisphere. If the patient undergoes MRI, recommend attention to this area.   Electronically Signed   By: Abigail Miyamoto M.D.   On: 01/02/2013 14:31   Ct Chest W Contrast  01/04/2013   CLINICAL DATA:  Brain lesion on comparison MRI. Concern for metastatic disease.  EXAM: CT CHEST, ABDOMEN, AND PELVIS WITH CONTRAST  TECHNIQUE: Multidetector CT imaging of the chest, abdomen and pelvis was performed following the standard protocol during bolus administration of intravenous contrast.  CONTRAST:  151mL OMNIPAQUE IOHEXOL 300 MG/ML  SOLN  COMPARISON:  US SCROTUM dated 09/08/2007; MR HEAD WO/W CM dated 01/03/2013  FINDINGS: CT CHEST FINDINGS  There is little body fat in the patient's thorax . No axillary lymphadenopathy. Potential right supraclavicular lymph node measuring 16 mm (image number 1, series 5). There is enlarged right lower paratracheal lymph node measuring 15 mm short axis. Left lower paratracheal lymph node measures 12 mm. There is a large subcarinal lymph node  measuring 17 mm short axis. Prominent right hilar lymph node measures 10 mm (image 25, series 2).  Review of the lung parenchyma demonstrates a spiculated nodule at the superior aspect of the right upper lobe measuring 7 x 7 mm (image 11, is series 6). This is also seen on image number 2 of series 4. No additional suspicious pulmonary nodules. Central airways are normal.  CT ABDOMEN AND PELVIS FINDINGS  Low-density lesion the posterior right hepatic lobe has simple fluid attenuation consistent with benign cyst. 3 mm hypodense lesion in the central left hepatic lobe (image 52) is too small to characterize. The gallbladder, pancreas, spleen, adrenal glands, and kidneys are normal.  Stomach, small bowel, and colon are unremarkable.  Abdominal aorta is normal caliber. No retroperitoneal periportal lymphadenopathy.  No free fluid the pelvis. The prostate gland and bladder normal. No pelvic lymphadenopathy. No aggressive osseous lesion.  IMPRESSION: 1. Mediastinal lymphadenopathy is concerning for metastatic adenopathy. 2. Small right upper lobe spiculated nodule may represent primary bronchogenic carcinoma. 3. Potential right supraclavicular enlarged lymph node. Ultrasound evaluation could delineate this lymph node. 4. Low-density lesions in the liver likely represent benign cysts. The smaller cyst is too small to characterize. 5. Patient may ultimately benefit from an outpatient FDG PET-CT staging exam.   Electronically Signed   By: Suzy Bouchard M.D.   On: 01/04/2013 15:20   Mr Jeri Cos ZJ Contrast  01/03/2013   CLINICAL DATA:  Seizure activity. History of drug abuse. Right-sided weakness.  EXAM: MRI HEAD WITHOUT AND WITH CONTRAST  TECHNIQUE: Multiplanar, multiecho pulse sequences of the brain and surrounding structures were obtained without and with intravenous contrast.  CONTRAST:  16mL MULTIHANCE GADOBENATE DIMEGLUMINE 529 MG/ML IV SOLN  COMPARISON:  Head CT 01/02/2013  FINDINGS: There is no evidence of ischemic  infarction. Within the left parietal lobe, there is an 11 x 13 mm enhancing focus with pronounced regional vasogenic edema. A 2nd enhancing focus is present within  the inferior cerebellum on the left measuring 6 mm in diameter. There is minimal edema in that region. Elsewhere, there are a few punctate foci of T2 and FLAIR signal in the hemispheric white matter not associated with abnormal enhancement and most likely indicative of old small vessel insults. There is a small focus of hemorrhage within the region of edema in the left parietal lobe. This is no larger than 3-4 mm. No hydrocephalus or extra-axial collection. Because of the edema, there is mass-effect on the posterior aspect of the left lateral ventricle. No midline shift. No pituitary mass. Sinuses are clear. No skull or skullbase lesion.  IMPRESSION: 11 x 13 mm enhancing lesion in the left parietal lobe with pronounced regional vasogenic edema. Second enhancing focus in the inferior cerebellum on the left measuring 6 mm in diameter with mild regional edema. The differential diagnosis is metastatic disease versus areas of focal cerebritis. Given the history of drug abuse, infectious cerebritis is certainly possible. The amount of vasogenic edema associated with the left parietal lesion is extreme, a finding often associated with acute cerebritis. .   Electronically Signed   By: Nelson Chimes M.D.   On: 01/03/2013 19:30   Ct Abdomen Pelvis W Contrast  01/04/2013   CLINICAL DATA:  Brain lesion on comparison MRI. Concern for metastatic disease.  EXAM: CT CHEST, ABDOMEN, AND PELVIS WITH CONTRAST  TECHNIQUE: Multidetector CT imaging of the chest, abdomen and pelvis was performed following the standard protocol during bolus administration of intravenous contrast.  CONTRAST:  135mL OMNIPAQUE IOHEXOL 300 MG/ML  SOLN  COMPARISON:  US SCROTUM dated 09/08/2007; MR HEAD WO/W CM dated 01/03/2013  FINDINGS: CT CHEST FINDINGS  There is little body fat in the patient's  thorax . No axillary lymphadenopathy. Potential right supraclavicular lymph node measuring 16 mm (image number 1, series 5). There is enlarged right lower paratracheal lymph node measuring 15 mm short axis. Left lower paratracheal lymph node measures 12 mm. There is a large subcarinal lymph node measuring 17 mm short axis. Prominent right hilar lymph node measures 10 mm (image 25, series 2).  Review of the lung parenchyma demonstrates a spiculated nodule at the superior aspect of the right upper lobe measuring 7 x 7 mm (image 11, is series 6). This is also seen on image number 2 of series 4. No additional suspicious pulmonary nodules. Central airways are normal.  CT ABDOMEN AND PELVIS FINDINGS  Low-density lesion the posterior right hepatic lobe has simple fluid attenuation consistent with benign cyst. 3 mm hypodense lesion in the central left hepatic lobe (image 52) is too small to characterize. The gallbladder, pancreas, spleen, adrenal glands, and kidneys are normal.  Stomach, small bowel, and colon are unremarkable.  Abdominal aorta is normal caliber. No retroperitoneal periportal lymphadenopathy.  No free fluid the pelvis. The prostate gland and bladder normal. No pelvic lymphadenopathy. No aggressive osseous lesion.  IMPRESSION: 1. Mediastinal lymphadenopathy is concerning for metastatic adenopathy. 2. Small right upper lobe spiculated nodule may represent primary bronchogenic carcinoma. 3. Potential right supraclavicular enlarged lymph node. Ultrasound evaluation could delineate this lymph node. 4. Low-density lesions in the liver likely represent benign cysts. The smaller cyst is too small to characterize. 5. Patient may ultimately benefit from an outpatient FDG PET-CT staging exam.   Electronically Signed   By: Suzy Bouchard M.D.   On: 01/04/2013 15:20   US Biopsy  01/06/2013   CLINICAL DATA:  Abnormal right supraclavicular lymph node.  EXAM: ULTRASOUND GUIDED core BIOPSY OF  a right supraclavicular  lymph node  COMPLICATIONS: None.  FINDINGS: Images document needle placement in the right supraclavicular lymph node. Post biopsy images demonstrate no evidence of hemorrhage.  IMPRESSION: Successful right supraclavicular lymph node core biopsy.  : PROCEDURE:  The procedure, risks, benefits, and alternatives were explained to the patient. Questions regarding the procedure were encouraged and answered. The patient understands and consents to the procedure.  The right neck was prepped with Betadine in a sterile fashion, and a sterile drape was applied covering the operative field. A sterile gown and sterile gloves were used for the procedure. Local anesthesia was provided with 1% Lidocaine.  Under sonographic guidance, 3 18 gauge core biopsies of the enlarged right supraclavicular lymph node were obtained without complication. Post biopsy imaging was performed.   Electronically Signed   By: [name] M.D.   On: 01/06/2013 15:04      A/P: 68 y.o. male admitted with tonic clonic seizure found to have a left parietal mass with surrounding vasogenic edema and  Mediastinal lymphadenopathy is concerning for metastatic adenopathy,Small right upper lobe spiculated nodule and  right supraclavicular enlarged lymph node.He underwent biopsy of the R supraclavicular lymph node, with preliminary positive for adenocarcinoma. Radiation Oncology to consider patient for [facility].  We were kindly requested to evaluate the patient.  Dr.Zhoey Blackstock  is to see the patient following this consult with recommendations regarding diagnosis, treatment options and further workup studies. An addendum to this note is to be written.  Thank you for the referral.  Rondel Jumbo, PA-C 01/08/2013 3:21 PM  ATTENDING'S ATTESTATION:  I personally reviewed patient's chart, examined patient myself, formulated the treatment plan as followed.    I reviewed the patient's pathology as well as her radiology personally. I have also discussed the findings of the  path report with the patient. He has been seen by Dr. Kyung Rudd for consideration of management of brain metastasis. Patient will also need systemic palliative chemotherapy. I have discussed this with him. He will need further staging studies including a PET scan. We will get this done as an outpatient. I will plan on seeing the patient back in my office upon discharge to discuss further specifics of his treatment protocol.  Marcy Panning, MD Medical/Oncology [facility] [phone] (beeper) [phone] (Office)  01/08/2013, 9:51 PM

## 2013-01-08 NOTE — Progress Notes (Signed)
Pathology results still pending from 1/6 supraclavicular node biopsy per IR.  Will cont to f/u for path results.    Darlina Sicilian, NP 01/08/2013  10:07 AM Pager: [phone] or [phone]  Mariel Sleet

## 2013-01-09 DIAGNOSIS — C3490 Malignant neoplasm of unspecified part of unspecified bronchus or lung: Secondary | ICD-10-CM

## 2013-01-09 DIAGNOSIS — G9389 Other specified disorders of brain: Secondary | ICD-10-CM

## 2013-01-09 MED ORDER — PANTOPRAZOLE SODIUM 40 MG PO TBEC: 40.0000 mg | DELAYED_RELEASE_TABLET | Freq: Every day | ORAL | Status: AC

## 2013-01-09 MED ORDER — HYDRALAZINE HCL 25 MG PO TABS: 25.0000 mg | ORAL_TABLET | Freq: Three times a day (TID) | ORAL | Status: AC

## 2013-01-09 MED ORDER — LEVETIRACETAM 1000 MG PO TABS: 1000.0000 mg | ORAL_TABLET | Freq: Two times a day (BID) | ORAL | Status: AC

## 2013-01-09 MED ORDER — ASPIRIN 325 MG PO TBEC: 325.0000 mg | DELAYED_RELEASE_TABLET | Freq: Every day | ORAL | Status: AC

## 2013-01-09 MED ORDER — LISINOPRIL 5 MG PO TABS: 5.0000 mg | ORAL_TABLET | Freq: Every day | ORAL | Status: DC

## 2013-01-09 MED ORDER — DEXAMETHASONE 4 MG PO TABS: 4.0000 mg | ORAL_TABLET | Freq: Four times a day (QID) | ORAL | Status: DC

## 2013-01-09 MED ORDER — AMLODIPINE BESYLATE 10 MG PO TABS: 10.0000 mg | ORAL_TABLET | Freq: Every day | ORAL | Status: AC

## 2013-01-09 NOTE — Discharge Summary (Signed)
Physician Discharge Summary  Carlos Beasley PRF:[id number] DOB: October 23, 1945 DOA: 01/02/2013  PCP: No primary provider on file.  Admit date: 01/02/2013 Discharge date: 01/09/2013  Time spent: 55 minutes  Recommendations for Outpatient Follow-up:  -Will be discharged home today. -Will receive follow up appointments from Dr. Chancy Milroy and Dr. Ida Rogue office.   Discharge Diagnoses:  Active Problems:   Stroke   Seizure cerebral   Adenocarcinoma of lung, stage 4   Brain mass   Discharge Condition: Stable and improved  Filed Weights   01/02/13 1800 01/05/13 0435  Weight: 71 kg (156 lb 8.4 oz) 73 kg (160 lb 15 oz)    History of present illness:  68 year old male, presents with a chief complaint of weakness on the right side. The patient upon his arrival to the ER was found to have tonic-clonic movements in the right arm and the right leg. He was last seen on about 2 hours prior to arrival. Patient was noted to be fairly hypertensive with systolic blood pressure greater than 200.  Code stroke was called and patient received Ativan and Keppra. He received a total of 4 mg of Ativan, thousand milligrams Keppra loading dose. He was seen by neurology the ER who recommended a full workup  Initial CT scan showed a large area of edema in the left parietal lobe  Please note that the patient has a history of hypertension, noncompliance with medications, cocaine abuse. After his last discharge he was supposed to be seen at the [facility] . I do not see that this ever transpired. Hospitalist admission was requested.   Hospital Course:   Enhancing brain lesions/Abnormal Chest CT - Presented with seizures and right-sided weakness which have significantly improved-continue Keppra and Decadron on DC. -Supraclavicular lymph node biopsy has confirmed what appears to adenocarcinoma of the lung. -Has been seen by Dr. Chancy Milroy (concology) and Dr. Lisbeth Renshaw (radiation oncology) with plans for OP followup for brain  radiation and palliative chemotherapy.  Seizures  - Secondary to above  - On Keppra and Decadron-continue   Acute encephalopathy  - Suspected secondary to postictal state, brain metastases  - Resolved   Right-sided weakness  - Secondary to metastatic brain lesions  - Better with Decadron  - Continue with PT/OT   Diffuse hilar and mediastinal lymphadenopathy/supraclavicular lymphadenopathy with spiculated right upper lobe lung nodule- suspected adenocarcinoma of lung  - CT chest that did show a small right upper lobe spiculated nodule  - Biopsy of right supraclavicular lymph node by interventional radiology on 1/6-positive for metastatic adenocarcinoma  -See above for details.  Hypertension  - Continue with lisinopril and amlodipine   Tobacco abuse  - Counseled, continue with transdermal nicotine    Consultations:  Oncology  Radiation oncology  Pulmonology  Discharge Instructions  Discharge Orders   Future Orders Complete By Expires   Diet - low sodium heart healthy  As directed    Discontinue IV  As directed    Increase activity slowly  As directed        Medication List         amLODipine 10 MG tablet  Commonly known as:  NORVASC  Take 1 tablet (10 mg total) by mouth daily.     aspirin 325 MG EC tablet  Take 1 tablet (325 mg total) by mouth daily.     dexamethasone 4 MG tablet  Commonly known as:  DECADRON  Take 1 tablet (4 mg total) by mouth every 6 (six) hours.  hydrALAZINE 25 MG tablet  Commonly known as:  APRESOLINE  Take 1 tablet (25 mg total) by mouth every 8 (eight) hours.     levETIRAcetam 1000 MG tablet  Commonly known as:  KEPPRA  Take 1 tablet (1,000 mg total) by mouth 2 (two) times daily.     lisinopril 5 MG tablet  Commonly known as:  PRINIVIL,ZESTRIL  Take 1 tablet (5 mg total) by mouth daily.     pantoprazole 40 MG tablet  Commonly known as:  PROTONIX  Take 1 tablet (40 mg total) by mouth daily.       No Known  Allergies     Follow-up Information   Follow up with Marcy Panning, MD. (office to call with appointment)    Specialty:  Oncology   Contact information:   [place] [address] [phone]       Follow up with Marye Round, MD. (office to call with appointment)    Specialty:  Radiation Oncology   Contact information:   [place] [place] [address] [phone]        The results of significant diagnostics from this hospitalization (including imaging, microbiology, ancillary and laboratory) are listed below for reference.    Significant Diagnostic Studies: Ct Head Wo Contrast  01/02/2013   CLINICAL DATA:  Right-sided weakness.  EXAM: CT HEAD WITHOUT CONTRAST  TECHNIQUE: Contiguous axial images were obtained from the base of the skull through the vertex without intravenous contrast.  COMPARISON:  None.  FINDINGS: Sinuses/Soft tissues: Clear paranasal sinuses and mastoid air cells.  Intracranial: Large area of left parietal hypoattenuation and mass effect. This causes partial effacement of the occipital horn of the left lateral ventricle. 1-2 mm of left-to-right midline shift.  No complicating hemorrhage.  Basal cisterns patent.  Apparent hypoattenuation in the left cerebellar hemisphere on image 6/series 2 is favored to be a artifactual.  No hydrocephalus, intra-axial, or extra-axial fluid collection.  IMPRESSION: 1. Large area of edema within the left parietal lobe. Given the clinical history, this most likely represents a subacute infarct. Given the appearance, differential consideration includes a mass lesion such as a metastasis. There is mass effect and minimal midline shift. Critical test results telephoned to.Dr. Regenia Skeeter. at the time of interpretation at 2:30 p.m.on 01/02/2013. 2. Probable artifactual hypoattenuation in the left cerebellar hemisphere. If the patient undergoes MRI, recommend attention to this area.   Electronically Signed   By: Abigail Miyamoto M.D.   On: 01/02/2013 14:31   Ct Chest W Contrast  01/04/2013   CLINICAL DATA:  Brain lesion on comparison MRI. Concern for metastatic disease.  EXAM: CT CHEST, ABDOMEN, AND PELVIS WITH CONTRAST  TECHNIQUE: Multidetector CT imaging of the chest, abdomen and pelvis was performed following the standard protocol during bolus administration of intravenous contrast.  CONTRAST:  166mL OMNIPAQUE IOHEXOL 300 MG/ML  SOLN  COMPARISON:  US SCROTUM dated 09/08/2007; MR HEAD WO/W CM dated 01/03/2013  FINDINGS: CT CHEST FINDINGS  There is little body fat in the patient's thorax . No axillary lymphadenopathy. Potential right supraclavicular lymph node measuring 16 mm (image number 1, series 5). There is enlarged right lower paratracheal lymph node measuring 15 mm short axis. Left lower paratracheal lymph node measures 12 mm. There is a large subcarinal lymph node measuring 17 mm short axis. Prominent right hilar lymph node measures 10 mm (image 25, series 2).  Review of the lung parenchyma demonstrates a spiculated nodule at the superior aspect of the right  upper lobe measuring 7 x 7 mm (image 11, is series 6). This is also seen on image number 2 of series 4. No additional suspicious pulmonary nodules. Central airways are normal.  CT ABDOMEN AND PELVIS FINDINGS  Low-density lesion the posterior right hepatic lobe has simple fluid attenuation consistent with benign cyst. 3 mm hypodense lesion in the central left hepatic lobe (image 52) is too small to characterize. The gallbladder, pancreas, spleen, adrenal glands, and kidneys are normal.  Stomach, small bowel, and colon are unremarkable.  Abdominal aorta is normal caliber. No retroperitoneal periportal lymphadenopathy.  No free fluid the pelvis. The prostate gland and bladder normal. No pelvic lymphadenopathy. No aggressive osseous lesion.  IMPRESSION: 1. Mediastinal lymphadenopathy is concerning for metastatic adenopathy. 2. Small right upper lobe spiculated nodule may  represent primary bronchogenic carcinoma. 3. Potential right supraclavicular enlarged lymph node. Ultrasound evaluation could delineate this lymph node. 4. Low-density lesions in the liver likely represent benign cysts. The smaller cyst is too small to characterize. 5. Patient may ultimately benefit from an outpatient FDG PET-CT staging exam.   Electronically Signed   By: Suzy Bouchard M.D.   On: 01/04/2013 15:20   Mr Jeri Cos XB Contrast  01/03/2013   CLINICAL DATA:  Seizure activity. History of drug abuse. Right-sided weakness.  EXAM: MRI HEAD WITHOUT AND WITH CONTRAST  TECHNIQUE: Multiplanar, multiecho pulse sequences of the brain and surrounding structures were obtained without and with intravenous contrast.  CONTRAST:  36mL MULTIHANCE GADOBENATE DIMEGLUMINE 529 MG/ML IV SOLN  COMPARISON:  Head CT 01/02/2013  FINDINGS: There is no evidence of ischemic infarction. Within the left parietal lobe, there is an 11 x 13 mm enhancing focus with pronounced regional vasogenic edema. A 2nd enhancing focus is present within the inferior cerebellum on the left measuring 6 mm in diameter. There is minimal edema in that region. Elsewhere, there are a few punctate foci of T2 and FLAIR signal in the hemispheric white matter not associated with abnormal enhancement and most likely indicative of old small vessel insults. There is a small focus of hemorrhage within the region of edema in the left parietal lobe. This is no larger than 3-4 mm. No hydrocephalus or extra-axial collection. Because of the edema, there is mass-effect on the posterior aspect of the left lateral ventricle. No midline shift. No pituitary mass. Sinuses are clear. No skull or skullbase lesion.  IMPRESSION: 11 x 13 mm enhancing lesion in the left parietal lobe with pronounced regional vasogenic edema. Second enhancing focus in the inferior cerebellum on the left measuring 6 mm in diameter with mild regional edema. The differential diagnosis is metastatic  disease versus areas of focal cerebritis. Given the history of drug abuse, infectious cerebritis is certainly possible. The amount of vasogenic edema associated with the left parietal lesion is extreme, a finding often associated with acute cerebritis. .   Electronically Signed   By: Nelson Chimes M.D.   On: 01/03/2013 19:30   Ct Abdomen Pelvis W Contrast  01/04/2013   CLINICAL DATA:  Brain lesion on comparison MRI. Concern for metastatic disease.  EXAM: CT CHEST, ABDOMEN, AND PELVIS WITH CONTRAST  TECHNIQUE: Multidetector CT imaging of the chest, abdomen and pelvis was performed following the standard protocol during bolus administration of intravenous contrast.  CONTRAST:  125mL OMNIPAQUE IOHEXOL 300 MG/ML  SOLN  COMPARISON:  US SCROTUM dated 09/08/2007; MR HEAD WO/W CM dated 01/03/2013  FINDINGS: CT CHEST FINDINGS  There is little body fat in the patient's thorax .  No axillary lymphadenopathy. Potential right supraclavicular lymph node measuring 16 mm (image number 1, series 5). There is enlarged right lower paratracheal lymph node measuring 15 mm short axis. Left lower paratracheal lymph node measures 12 mm. There is a large subcarinal lymph node measuring 17 mm short axis. Prominent right hilar lymph node measures 10 mm (image 25, series 2).  Review of the lung parenchyma demonstrates a spiculated nodule at the superior aspect of the right upper lobe measuring 7 x 7 mm (image 11, is series 6). This is also seen on image number 2 of series 4. No additional suspicious pulmonary nodules. Central airways are normal.  CT ABDOMEN AND PELVIS FINDINGS  Low-density lesion the posterior right hepatic lobe has simple fluid attenuation consistent with benign cyst. 3 mm hypodense lesion in the central left hepatic lobe (image 52) is too small to characterize. The gallbladder, pancreas, spleen, adrenal glands, and kidneys are normal.  Stomach, small bowel, and colon are unremarkable.  Abdominal aorta is normal caliber. No  retroperitoneal periportal lymphadenopathy.  No free fluid the pelvis. The prostate gland and bladder normal. No pelvic lymphadenopathy. No aggressive osseous lesion.  IMPRESSION: 1. Mediastinal lymphadenopathy is concerning for metastatic adenopathy. 2. Small right upper lobe spiculated nodule may represent primary bronchogenic carcinoma. 3. Potential right supraclavicular enlarged lymph node. Ultrasound evaluation could delineate this lymph node. 4. Low-density lesions in the liver likely represent benign cysts. The smaller cyst is too small to characterize. 5. Patient may ultimately benefit from an outpatient FDG PET-CT staging exam.   Electronically Signed   By: Suzy Bouchard M.D.   On: 01/04/2013 15:20   US Biopsy  01/06/2013   CLINICAL DATA:  Abnormal right supraclavicular lymph node.  EXAM: ULTRASOUND GUIDED core BIOPSY OF a right supraclavicular lymph node  COMPLICATIONS: None.  FINDINGS: Images document needle placement in the right supraclavicular lymph node. Post biopsy images demonstrate no evidence of hemorrhage.  IMPRESSION: Successful right supraclavicular lymph node core biopsy.  : PROCEDURE:  The procedure, risks, benefits, and alternatives were explained to the patient. Questions regarding the procedure were encouraged and answered. The patient understands and consents to the procedure.  The right neck was prepped with Betadine in a sterile fashion, and a sterile drape was applied covering the operative field. A sterile gown and sterile gloves were used for the procedure. Local anesthesia was provided with 1% Lidocaine.  Under sonographic guidance, 3 18 gauge core biopsies of the enlarged right supraclavicular lymph node were obtained without complication. Post biopsy imaging was performed.   Electronically Signed   By: [name] M.D.   On: 01/06/2013 15:04    Microbiology: Recent Results (from the past 240 hour(s))  MRSA PCR SCREENING     Status: None   Collection Time    01/02/13   5:56 PM      Result Value Range Status   MRSA by PCR NEGATIVE  NEGATIVE Final   Comment:            The GeneXpert MRSA Assay (FDA     approved for NASAL specimens     only), is one component of a     comprehensive MRSA colonization     surveillance program. It is not     intended to diagnose MRSA     infection nor to guide or     monitor treatment for     MRSA infections.  CULTURE, BLOOD (ROUTINE X 2)     Status: None   Collection  Time    01/04/13 11:00 AM      Result Value Range Status   Specimen Description BLOOD RIGHT ARM   Final   Special Requests     Final   Value: BOTTLES DRAWN AEROBIC AND ANAEROBIC 10CC BLUE, 5CC RED   Culture  Setup Time     Final   Value: 01/04/2013 19:28     Performed at [facility]   Culture     Final   Value:        BLOOD CULTURE RECEIVED NO GROWTH TO DATE CULTURE WILL BE HELD FOR 5 DAYS BEFORE ISSUING A FINAL NEGATIVE REPORT     Performed at [facility]   Report Status PENDING   Incomplete  CULTURE, BLOOD (ROUTINE X 2)     Status: None   Collection Time    01/04/13 11:10 AM      Result Value Range Status   Specimen Description BLOOD RIGHT ARM   Final   Special Requests BOTTLES DRAWN AEROBIC ONLY [place]   Final   Culture  Setup Time     Final   Value: 01/04/2013 19:29     Performed at [facility]   Culture     Final   Value:        BLOOD CULTURE RECEIVED NO GROWTH TO DATE CULTURE WILL BE HELD FOR 5 DAYS BEFORE ISSUING A FINAL NEGATIVE REPORT     Performed at [facility]   Report Status PENDING   Incomplete  CULTURE, BLOOD (ROUTINE X 2)     Status: None   Collection Time    01/07/13 12:06 PM      Result Value Range Status   Specimen Description BLOOD RIGHT ARM   Final   Special Requests BOTTLES DRAWN AEROBIC ONLY 10CC   Final   Culture  Setup Time     Final   Value: 01/07/2013 16:30     Performed at [facility]   Culture     Final   Value:        BLOOD CULTURE RECEIVED NO GROWTH TO DATE CULTURE  WILL BE HELD FOR 5 DAYS BEFORE ISSUING A FINAL NEGATIVE REPORT     Performed at [facility]   Report Status PENDING   Incomplete  CULTURE, BLOOD (ROUTINE X 2)     Status: None   Collection Time    01/07/13 12:15 PM      Result Value Range Status   Specimen Description BLOOD RIGHT FOREARM   Final   Special Requests BOTTLES DRAWN AEROBIC ONLY [place]   Final   Culture  Setup Time     Final   Value: 01/07/2013 16:30     Performed at [facility]   Culture     Final   Value:        BLOOD CULTURE RECEIVED NO GROWTH TO DATE CULTURE WILL BE HELD FOR 5 DAYS BEFORE ISSUING A FINAL NEGATIVE REPORT     Performed at [facility]   Report Status PENDING   Incomplete     Labs: Basic Metabolic Panel:  Recent Labs Lab 01/02/13 1600 01/03/13 0255 01/08/13 1300  NA  --  142 136*  K  --  4.9 4.3  CL  --  105 101  CO2  --  26 23  GLUCOSE  --  105* 169*  BUN  --  18 15  CREATININE  --  1.42* 1.03  CALCIUM  --  8.7  8.5  MG 2.2  --   --    Liver Function Tests:  Recent Labs Lab 01/02/13 1600 01/03/13 0255 01/08/13 1300  AST 15 14 14   ALT 10 9 20   ALKPHOS 45 45 54  BILITOT 0.3 <0.2* <0.2*  PROT 7.4 7.2 6.6  ALBUMIN 3.4* 3.1* 2.9*   No results found for this basename: LIPASE, AMYLASE,  in the last 168 hours No results found for this basename: AMMONIA,  in the last 168 hours CBC:  Recent Labs Lab 01/02/13 1600 01/03/13 0255 01/08/13 1300  WBC 5.0 4.8 8.5  NEUTROABS  --   --  7.3  HGB 11.9* 12.6* 12.7*  HCT 36.2* 39.0 38.2*  MCV 91.2 90.3 89.9  PLT 225 238 216   Cardiac Enzymes:  Recent Labs Lab 01/02/13 1600 01/02/13 2130 01/03/13 0255  TROPONINI <0.30 <0.30 <0.30   BNP: BNP (last 3 results) No results found for this basename: PROBNP,  in the last 8760 hours CBG:  Recent Labs Lab 01/03/13 0605 01/04/13 0748  GLUCAP 123* 84       Signed:  HERNANDEZ ACOSTA,ESTELA  [facility] Pager: [phone] 01/09/2013, 3:07 PM

## 2013-01-09 NOTE — Progress Notes (Signed)
PULMONARY / CRITICAL CARE MEDICINE  Name: Carlos Beasley MRN: [id number] DOB: [birth year]    ADMISSION DATE:  01/02/2013 CONSULTATION DATE:  01/06/2013  REFERRING MD :  Dr. Valinda Party - [facility] PRIMARY SERVICE:  [facility]  CHIEF COMPLAINT:  Abnormal CT Chest  BRIEF PATIENT DESCRIPTION: 68 y/o M admitted with R sided weakness, hypertension as a code stroke.  Abnormal CT Head with concern for metastatic cancer prompted further screening imaging which revealed CT Chest with mediastinal lymphadenopathy, small RUL spiculated nodule concerning for primary bronchogenic carcinoma.  PCCM consulted for evaluation / biopsy.   SIGNIFICANT EVENTS / STUDIES:  1/2 -  Head CT >>>  Large area of edema within the left parietal lobe. There is mass effect and minimal midline shift. Probable artifactual hypoattenuation in the left cerebellar hemisphere. 1/3 -  Brain MRI >>> 11 x 13 mm enhancing lesion in the left parietal lobe with pronounced regional vasogenic edema. Second enhancing focus in the inferior cerebellum on the left measuring 6 mm in diameter with mild regional edema. 1/4 -  Abdomen / pelvis CT >>>  Low-density lesions in the liver likely represent benign cysts. The smaller cyst is too small to characterize. 1/4 -  Chest CT >>>  Mediastinal lymphadenopathy is concerning for metastatic adenopathy. Small right upper lobe spiculated nodule may represent primary bronchogenic carcinoma. Potential right supraclavicular enlarged lymph node. 1/6 - R Supraclavicular node biopsy>>>consistent with adenocarcinoma of lung primary.  CULTURES: 1/4  Blood >>>NEG 1/7 Blood>>>  ANTIBIOTICS: none  SUBJECTIVE:   No changes VITAL SIGNS: Temp:  [97.5 F (36.4 C)-98.8 F (37.1 C)] 98.2 F (36.8 C) (01/09 6503) Pulse Rate:  [54-58] 54 (01/09 0623) Resp:  [16-18] 18 (01/09 0623) BP: (134-144)/(62-79) 140/62 mmHg (01/09 0623) SpO2:  [100 %] 100 % (01/09 0623)  PHYSICAL EXAMINATION: General:  Resting comfortably, appears to be  in no acute distress Neuro:  Awake, alert, cooperative with examination, non-focal HEENT:  NCAT, PERRL, moist membranes Neck:  Soft, no bruits, no lymphadenopathy, no carotid btuits Cardiovascular:  RRR, no m/r/g Lungs:  Bilateral air entry, no w/r/r Abdomen:  Soft, nontender, bowel sounds present, no organomegaly Musculoskeletal:  Moves all extremities, no edema, no digital clubbing Skin:  Intact, no rash   Recent Labs Lab 01/02/13 1410 01/02/13 1420 01/03/13 0255 01/08/13 1300  NA 143 143 142 136*  K 4.8 4.6 4.9 4.3  CL 103 105 105 101  CO2 24  --  26 23  BUN 19 22 18 15   CREATININE 1.65* 1.80* 1.42* 1.03  GLUCOSE 127* 127* 105* 169*    Recent Labs Lab 01/02/13 1600 01/03/13 0255 01/08/13 1300  HGB 11.9* 12.6* 12.7*  HCT 36.2* 39.0 38.2*  WBC 5.0 4.8 8.5  PLT 225 238 216    CXR: none  ASSESSMENT / PLAN:  Seizures  Multiple brain lesions, likely metastatic in nature.   Right upper lobe speculated nodule Mediastinal lymphadenopathy Right supraclavicular adenopathy s/p bx consistent with adenocarcinoma of lung primary Tobacco use history R Tongue Lesion  -? [place] vs leukoplakia    Plan: -Oncology & Radiation Oncology evaluation ongoing, will need outpatient f/u arranged -Possible radiosurgery pending in approx 2 wks -Recommend outpatient PET scan be arranged -continue keppra / decadron for seizures  PCCM will be available PRN.  Please call if we can be of further assistance.  Thank you for the consultation.   Saralyn Pilar WrightMD  01/09/2013, 9:36 AM

## 2013-01-09 NOTE — Discharge Instructions (Signed)
STROKE/TIA DISCHARGE INSTRUCTIONS SMOKING Cigarette smoking nearly doubles your risk of having a stroke & is the single most alterable risk factor  If you smoke or have smoked in the last 12 months, you are advised to quit smoking for your health.  Most of the excess cardiovascular risk related to smoking disappears within a year of stopping.  Ask you doctor about anti-smoking medications  [place] Quit Line: 1-800-QUIT NOW  Free Smoking Cessation Classes [phone]  CHOLESTEROL Know your levels; limit fat & cholesterol in your diet  Lipid Panel     Component Value Date/Time   CHOL 185 01/02/2013 1600   TRIG 82 01/02/2013 1600   HDL 74 01/02/2013 1600   CHOLHDL 2.5 01/02/2013 1600   VLDL 16 01/02/2013 1600   LDLCALC 95 01/02/2013 1600      Many patients benefit from treatment even if their cholesterol is at goal.  Goal: Total Cholesterol (CHOL) less than 160  Goal:  Triglycerides (TRIG) less than 150  Goal:  HDL greater than 40  Goal:  LDL (LDLCALC) less than 100   BLOOD PRESSURE American Stroke Association blood pressure target is less that 120/80 mm/Hg  Your discharge blood pressure is:  BP: 149/76 mmHg  Monitor your blood pressure  Limit your salt and alcohol intake  Many individuals will require more than one medication for high blood pressure  DIABETES (A1c is a blood sugar average for last 3 months) Goal HGBA1c is under 7% (HBGA1c is blood sugar average for last 3 months)  Diabetes: No known diagnosis of diabetes    Lab Results  Component Value Date   HGBA1C 6.1* 01/02/2013     Your HGBA1c can be lowered with medications, healthy diet, and exercise.  Check your blood sugar as directed by your physician  Call your physician if you experience unexplained or low blood sugars.  PHYSICAL ACTIVITY/REHABILITATION Goal is 30 minutes at least 4 days per week  Activity: Increase activity slowly, Therapies: Physical Therapy: home    Return to work:    Activity decreases your  risk of heart attack and stroke and makes your heart stronger.  It helps control your weight and blood pressure; helps you relax and can improve your mood.  Participate in a regular exercise program.  Talk with your doctor about the best form of exercise for you (dancing, walking, swimming, cycling).  DIET/WEIGHT Goal is to maintain a healthy weight  Your discharge diet is: General thin  liquids Your height is:  Height: 6\' 4"  (193 cm) Your current weight is: Weight: 73 kg (160 lb 15 oz) Your Body Mass Index (BMI) is:  BMI (Calculated): 19.1  Following the type of diet specifically designed for you will help prevent another stroke.  Your goal weight range is:  160 - 197  Your goal Body Mass Index (BMI) is 19-24.  Healthy food habits can help reduce 3 risk factors for stroke:  High cholesterol, hypertension, and excess weight.  RESOURCES Stroke/Support Group:  Call [phone]   STROKE EDUCATION PROVIDED/REVIEWED AND GIVEN TO PATIENT Stroke warning signs and symptoms How to activate emergency medical system (call 911). Medications prescribed at discharge. Need for follow-up after discharge. Personal risk factors for stroke. Pneumonia vaccine given: No Flu vaccine given: No My questions have been answered, the writing is legible, and I understand these instructions.  I will adhere to these goals & educational materials that have been provided to me after my discharge from the hospital.

## 2013-01-09 NOTE — Progress Notes (Signed)
Occupational Therapy Treatment Patient Details Name: Carlos Beasley MRN: [id number] DOB: May 20, 1945 Today's Date: 01/09/2013 Time: 4970-2637 OT Time Calculation (min): 44 min  OT Assessment / Plan / Recommendation  History of present illness Pt admitted with new onset of seizure.  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 malignancy.   OT comments  Pt making good progress with ADL and functional use of R hand. Completed education regarding safety given proprioceptive deficits. Completed eduction regarding HEP for RUE. Feel pt can progress on his own with HEP.   Follow Up Recommendations  No OT follow up    Barriers to Discharge       Equipment Recommendations  None recommended by OT    Recommendations for Other Services    Frequency Min 2X/week   Progress towards OT Goals Progress towards OT goals: Goals met and updated - see care plan  Plan Discharge plan needs to be updated    Precautions / Restrictions Precautions Precautions: Fall Precaution Comments: right sided weakness and inattention.    Pertinent Vitals/Pain no apparent distress     ADL  Transfers/Ambulation Related to ADLs: S ADL Comments: completing ADL withS    OT Diagnosis:    OT Problem List:   OT Treatment Interventions:     OT Goals(current goals can now be found in the care plan section) Acute Rehab OT Goals Patient Stated Goal: to go home OT Goal Formulation: With patient Time For Goal Achievement: 01/19/13 Potential to Achieve Goals: Good ADL Goals Pt Will Transfer to Toilet: with modified independence;ambulating;regular height toilet Pt Will Perform Tub/Shower Transfer: with modified independence;ambulating;grab bars;Tub transfer Pt/caregiver will Perform Home Exercise Program: Right Upper extremity;With theraputty;With written HEP provided Additional ADL Goal #1: Pt will state compensatory strategies for impaired R hand sensation to prevent injury.  Visit Information  Last  OT Received On: 01/09/13 Assistance Needed: +1 History of Present Illness: Pt admitted with new onset of seizure.  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 malignancy.    Subjective Data      Prior Functioning       Cognition  Cognition Arousal/Alertness: Awake/alert Behavior During Therapy: WFL for tasks assessed/performed    Mobility       Exercises  Other Exercises Other Exercises: educated pt on HEP for fine motor/coordination and strengthening. used theraputty and pich exerciser. Pt given written information and demonstrated independence with HEP Other Exercises: discussed safety implications with proprioceptive impairments and need to know where hand is at all times. discussed need to refrain from using any type of power tool, home safety and safety in the kitchen   Balance    End of Session OT - End of Session Activity Tolerance: Patient tolerated treatment well Patient left: in chair;with call bell/phone within reach Nurse Communication: Mobility status  GO     Lautaro Koral,H[place]ARY 01/09/2013, 11:41 AM Maurie Boettcher, OTR/L  [phone] 01/09/2013

## 2013-01-09 NOTE — Progress Notes (Signed)
Physical Therapy Treatment Patient Details Name: Carlos Beasley MRN: [id number] DOB: 1945/11/28 Today's Date: 01/09/2013 Time: 7169-6789 PT Time Calculation (min): 23 min  PT Assessment / Plan / Recommendation  History of Present Illness Pt admitted with new onset of seizure.  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 malignancy.  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     PT Comments   Pt is progressing well, safe with gait with SPC.  At this point his gait deviations are mild, he is safe walking around the room without an assistive device (although he was advised he would be safer walking outside with his cane).  He has some OT needs (OT paged), but is ok d/cing home with father's supervision and no PT f/u at discharge.    Follow Up Recommendations  No PT follow up;Supervision - Intermittent     Does the patient have the potential to tolerate intense rehabilitation    NA  Barriers to Discharge   None      Equipment Recommendations  None recommended by PT    Recommendations for Other Services   None  Frequency Min 4X/week   Progress towards PT Goals Progress towards PT goals: Progressing toward goals  Plan Discharge plan needs to be updated    Precautions / Restrictions Precautions Precautions: Fall Precaution Comments: right sided weakness and inattention.    Pertinent Vitals/Pain See vitals flow sheet.    Mobility  Bed Mobility Overal bed mobility: Modified Independent General bed mobility comments: used railing, but got to EOB easily Transfers Overall transfer level: Needs assistance Equipment used: Straight cane Transfers: Sit to/from Stand Sit to Stand: Supervision General transfer comment: uses arms to control ascent and descent during transitions Ambulation/Gait Ambulation/Gait assistance: Supervision Ambulation Distance (Feet): 200 Feet Assistive device: Straight cane Gait  Pattern/deviations: Step-through pattern;Decreased step length - right;Decreased dorsiflexion - right Gait velocity: decreased General Gait Details: Pt continues to have mild gait deviations, but showed proficiency using the cane in his left hand and demonstrated correct gait pattern with cane.   Stairs: Yes Stairs assistance: Supervision Stair Management: One rail Right;One rail Left;With cane;Step to pattern;Forwards Number of Stairs: 9 General stair comments: Pt showed proficiency using the cane with a safe step to gait pattern.  Verbal cues for correct leg sequencing.     Exercises Other Exercises Other Exercises: Pt inquiring about walking when he got home (he reports he normally walks a mile per day). Instructions given on safety and activity progression.  I encouraged him to use the cane and have someone with him the first few times he walked outside.  Other Exercises: Paged OT to see if they could provide him with an upper extremity coordination/strength program.       PT Goals (current goals can now be found in the care plan section) Acute Rehab PT Goals Patient Stated Goal: to go home  Visit Information  Last PT Received On: 01/09/13 Assistance Needed: +1 History of Present Illness: Pt admitted with new onset of seizure.  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 malignancy.  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      Subjective Data  Subjective: Pt reports that his leg is feeling much stronger, but that the coordination and slowness to move of his right hand is his most prominant deficit.  Patient Stated Goal: to  go home   Cognition  Cognition Arousal/Alertness: Awake/alert Behavior During Therapy: WFL for tasks assessed/performed General Comments: No episodes of running into right sided objects today.         End of Session PT - End of Session Equipment Utilized During Treatment: Gait  belt Activity Tolerance: Patient tolerated treatment well Patient left: in chair;with call bell/phone within reach     [place] B. Marcene Laskowski, [place], DPT [phone]   01/09/2013, 5:59 PM

## 2013-01-09 NOTE — Progress Notes (Addendum)
Radiation Oncology  Pathology reviewed with biopsy of supraclavicular node consistent with adenocarcinoma of lung primary. Patient's presentation therefore consistent with brain metastases as suspected. Recommend radiosurgery to the 2 brain metastases. We will arrange for a brain MRI scan as an outpatient, an SRS-protocol scan for treatment planning purposes. We will also schedule the patient for a CT simulation in our department. I would anticipate that these can be completed within a few days of his discharge when medically suitable, and his single radiosurgery treatment would then be completed within several days to one week of the scans being completed. Our staff will contact him with all of these arrangements.  Recommend continuing current steroid dose for the time being given the large amount of edema, and we will give him instructions to taper this as an outpatient.

## 2013-01-10 LAB — CULTURE, BLOOD (ROUTINE X 2)
CULTURE: NO GROWTH
Culture: NO GROWTH

## 2013-01-12 ENCOUNTER — Telehealth: Payer: Self-pay | Admitting: Oncology

## 2013-01-12 ENCOUNTER — Other Ambulatory Visit: Payer: Self-pay | Admitting: Radiation Therapy

## 2013-01-12 DIAGNOSIS — C7949 Secondary malignant neoplasm of other parts of nervous system: Principal | ICD-10-CM

## 2013-01-12 DIAGNOSIS — C7931 Secondary malignant neoplasm of brain: Secondary | ICD-10-CM

## 2013-01-12 NOTE — Telephone Encounter (Signed)
S/W PT SON AND GVE NP APPT 01/19 @ 8:30 PER MD WELCOME PACKET EMAILED [email or web address]

## 2013-01-12 NOTE — Telephone Encounter (Signed)
C/D 01/12/13 for appt. 01/19/13

## 2013-01-13 ENCOUNTER — Ambulatory Visit
Admission: RE | Admit: 2013-01-13 | Discharge: 2013-01-13 | Disposition: A | Discharge status: Home / Self Care | Payer: Medicare Other | Source: Ambulatory Visit | Attending: Radiation Oncology | Admitting: Radiation Oncology

## 2013-01-13 DIAGNOSIS — C7949 Secondary malignant neoplasm of other parts of nervous system: Principal | ICD-10-CM

## 2013-01-13 DIAGNOSIS — C7931 Secondary malignant neoplasm of brain: Secondary | ICD-10-CM

## 2013-01-13 LAB — CULTURE, BLOOD (ROUTINE X 2)
CULTURE: NO GROWTH
Culture: NO GROWTH

## 2013-01-13 MED ORDER — GADOBENATE DIMEGLUMINE 529 MG/ML IV SOLN
15.0000 mL | Freq: Once | INTRAVENOUS | Status: AC | PRN
  Administered 2013-01-13: 15 mL via INTRAVENOUS

## 2013-01-14 ENCOUNTER — Ambulatory Visit
Admission: RE | Admit: 2013-01-14 | Discharge: 2013-01-14 | Disposition: A | Discharge status: Home / Self Care | Payer: Medicare Other | Source: Ambulatory Visit | Attending: Radiation Oncology | Admitting: Radiation Oncology

## 2013-01-14 ENCOUNTER — Telehealth: Payer: Self-pay | Admitting: *Deleted

## 2013-01-14 ENCOUNTER — Encounter: Payer: Self-pay | Admitting: Oncology

## 2013-01-14 ENCOUNTER — Encounter: Payer: Self-pay | Admitting: Radiation Oncology

## 2013-01-14 VITALS — BP 133/62 | HR 79 | Temp 98.3°F | Resp 16

## 2013-01-14 DIAGNOSIS — G9389 Other specified disorders of brain: Secondary | ICD-10-CM

## 2013-01-14 DIAGNOSIS — Z510 Encounter for antineoplastic radiation therapy: Secondary | ICD-10-CM | POA: Insufficient documentation

## 2013-01-14 DIAGNOSIS — C7931 Secondary malignant neoplasm of brain: Secondary | ICD-10-CM | POA: Insufficient documentation

## 2013-01-14 DIAGNOSIS — C7949 Secondary malignant neoplasm of other parts of nervous system: Secondary | ICD-10-CM

## 2013-01-14 DIAGNOSIS — C3490 Malignant neoplasm of unspecified part of unspecified bronchus or lung: Secondary | ICD-10-CM | POA: Insufficient documentation

## 2013-01-14 MED ORDER — SODIUM CHLORIDE 0.9 % IJ SOLN
10.0000 mL | Freq: Once | INTRAMUSCULAR | Status: AC
  Administered 2013-01-14: 10 mL via INTRAVENOUS

## 2013-01-14 NOTE — Progress Notes (Signed)
BUN 15 and creatinine 1.03 drawn 01/08/2013. Per Dr. Ida Rogue ordered started left forearm 22 gauge IV on the first attempt. Excellent blood return obtained and flushed without restriction. Patient tolerated well. Secured IV in place and escorted patient to CT/SIM.

## 2013-01-14 NOTE — Telephone Encounter (Signed)
Returned call to son Amen Staszak in [place], , answered some questions of [place]  CT simulation today and to have [place] tx

## 2013-01-14 NOTE — Telephone Encounter (Signed)
returned call to son [name] of [place] ,[place] some questions of patient coming today for CT Simulation  Today for left parietal brain mets, and that patient will have treatment Monday 01/19/13, with Dr.Moody and Neurosurgeon, son only knows his dad has stage IV cancer that was told to him when he found out father was in hospital, Did inform him that Dr.moody saw him as a consult 01/09/13 and I have not met the patient as yet,, patient will see Dr. Humphrey Rolls also on 01/19/13 in the morning, and Dr.Moody will discuss process of radiation to him on Monday,  Son will be bringing his dad Monday , and will get further information of father's status  And treatments ,son thanked me for returning call 11:16 AM

## 2013-01-15 ENCOUNTER — Encounter (HOSPITAL_COMMUNITY): Payer: Self-pay

## 2013-01-16 ENCOUNTER — Other Ambulatory Visit: Payer: Self-pay | Admitting: Emergency Medicine

## 2013-01-16 ENCOUNTER — Encounter: Payer: Self-pay | Admitting: Medical Oncology

## 2013-01-16 ENCOUNTER — Encounter (HOSPITAL_COMMUNITY): Payer: Self-pay

## 2013-01-16 DIAGNOSIS — G9389 Other specified disorders of brain: Secondary | ICD-10-CM

## 2013-01-16 NOTE — Progress Notes (Signed)
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

## 2013-01-19 ENCOUNTER — Encounter: Payer: Self-pay | Admitting: *Deleted

## 2013-01-19 ENCOUNTER — Ambulatory Visit (HOSPITAL_BASED_OUTPATIENT_CLINIC_OR_DEPARTMENT_OTHER): Payer: Medicare Other | Admitting: Oncology

## 2013-01-19 ENCOUNTER — Telehealth: Payer: Self-pay | Admitting: *Deleted

## 2013-01-19 ENCOUNTER — Encounter: Payer: Self-pay | Admitting: Oncology

## 2013-01-19 ENCOUNTER — Encounter: Payer: Self-pay | Admitting: Radiation Oncology

## 2013-01-19 ENCOUNTER — Ambulatory Visit
Admission: RE | Admit: 2013-01-19 | Discharge: 2013-01-19 | Disposition: A | Discharge status: Home / Self Care | Payer: Medicare Other | Source: Ambulatory Visit | Attending: Radiation Oncology | Admitting: Radiation Oncology

## 2013-01-19 ENCOUNTER — Other Ambulatory Visit (HOSPITAL_BASED_OUTPATIENT_CLINIC_OR_DEPARTMENT_OTHER): Payer: Medicare Other

## 2013-01-19 ENCOUNTER — Telehealth: Payer: Self-pay | Admitting: Oncology

## 2013-01-19 ENCOUNTER — Ambulatory Visit: Payer: Medicare Other

## 2013-01-19 VITALS — BP 183/85 | HR 73 | Temp 98.3°F | Resp 20

## 2013-01-19 VITALS — BP 146/67 | HR 67 | Temp 98.3°F | Resp 18 | Ht 76.0 in | Wt 164.1 lb

## 2013-01-19 DIAGNOSIS — C3490 Malignant neoplasm of unspecified part of unspecified bronchus or lung: Secondary | ICD-10-CM

## 2013-01-19 DIAGNOSIS — C7949 Secondary malignant neoplasm of other parts of nervous system: Principal | ICD-10-CM

## 2013-01-19 DIAGNOSIS — C7931 Secondary malignant neoplasm of brain: Secondary | ICD-10-CM

## 2013-01-19 DIAGNOSIS — Z923 Personal history of irradiation: Secondary | ICD-10-CM

## 2013-01-19 DIAGNOSIS — C779 Secondary and unspecified malignant neoplasm of lymph node, unspecified: Secondary | ICD-10-CM

## 2013-01-19 DIAGNOSIS — Z720 Tobacco use: Secondary | ICD-10-CM

## 2013-01-19 DIAGNOSIS — G939 Disorder of brain, unspecified: Secondary | ICD-10-CM

## 2013-01-19 DIAGNOSIS — G9389 Other specified disorders of brain: Secondary | ICD-10-CM

## 2013-01-19 HISTORY — DX: Personal history of irradiation: Z92.3

## 2013-01-19 LAB — CBC WITH DIFFERENTIAL/PLATELET
BASO%: 0 % (ref 0.0–2.0)
BASOS ABS: 0 10*3/uL (ref 0.0–0.1)
EOS%: 0.3 % (ref 0.0–7.0)
Eosinophils Absolute: 0 10*3/uL (ref 0.0–0.5)
HEMATOCRIT: 36.7 % — AB (ref 38.4–49.9)
HGB: 12 g/dL — ABNORMAL LOW (ref 13.0–17.1)
LYMPH%: 12.1 % — ABNORMAL LOW (ref 14.0–49.0)
MCH: 29.2 pg (ref 27.2–33.4)
MCHC: 32.7 g/dL (ref 32.0–36.0)
MCV: 89.3 fL (ref 79.3–98.0)
MONO#: 0.6 10*3/uL (ref 0.1–0.9)
MONO%: 5.9 % (ref 0.0–14.0)
NEUT#: 7.7 10*3/uL — ABNORMAL HIGH (ref 1.5–6.5)
NEUT%: 81.7 % — AB (ref 39.0–75.0)
PLATELETS: 214 10*3/uL (ref 140–400)
RBC: 4.11 10*6/uL — ABNORMAL LOW (ref 4.20–5.82)
RDW: 14.6 % (ref 11.0–14.6)
WBC: 9.5 10*3/uL (ref 4.0–10.3)
lymph#: 1.2 10*3/uL (ref 0.9–3.3)
nRBC: 0 % (ref 0–0)

## 2013-01-19 LAB — COMPREHENSIVE METABOLIC PANEL (CC13)
ALBUMIN: 3.3 g/dL — AB (ref 3.5–5.0)
ALT: 16 U/L (ref 0–55)
ANION GAP: 9 meq/L (ref 3–11)
AST: 13 U/L (ref 5–34)
Alkaline Phosphatase: 48 U/L (ref 40–150)
BUN: 28.5 mg/dL — AB (ref 7.0–26.0)
CALCIUM: 9.2 mg/dL (ref 8.4–10.4)
CHLORIDE: 102 meq/L (ref 98–109)
CO2: 27 mEq/L (ref 22–29)
Creatinine: 1.4 mg/dL — ABNORMAL HIGH (ref 0.7–1.3)
GLUCOSE: 127 mg/dL (ref 70–140)
POTASSIUM: 4.7 meq/L (ref 3.5–5.1)
Sodium: 138 mEq/L (ref 136–145)
Total Bilirubin: 0.33 mg/dL (ref 0.20–1.20)
Total Protein: 6.6 g/dL (ref 6.4–8.3)

## 2013-01-19 NOTE — Progress Notes (Signed)
  Radiation Oncology         [phone] ________________________________  Name: Carlos Beasley MRN: [id number]  Date: 01/19/2013  DOB: 05/12/1945  End of Treatment Note  Diagnosis:   Metastatic non-small cell lung cancer     Indication for treatment:  Curative       Radiation treatment dates:   01/19/2013  Site/dose:    1. PTV1 left parietal 42mm target was treated using 3 Arcs to a prescription dose of 20 Gy. ExacTrac Snap verification was performed for each couch angle.  2. PTV2 Inf Lt cerebellar 21mm target was treated using 3 Arcs to a prescription dose of 20 Gy. ExacTrac Snap verification was performed for each couch angle.   Narrative: The patient tolerated radiation treatment relatively well.   The patient did not exhibit any acute toxicity after treatment.  Plan: The patient has completed radiation treatment. The patient will return to radiation oncology clinic for routine followup in one month. I advised the patient to call or return sooner if they have any questions or concerns related to their recovery or treatment. ________________________________  Jodelle Gross, M.D., Ph.D.

## 2013-01-19 NOTE — Patient Instructions (Signed)
Slowly decrease the amount of steroid (decadron, also known as dexamethasone) you are taking: take 1 tablet 4 times per day for 3 days, then take 1 tablet three times a day for 1 week, then take 1 tablet two times a day for 1 week, then take 1 tablet one time a day for 1 week, then take 1/2 tablet one time per day for 1 week.

## 2013-01-19 NOTE — Telephone Encounter (Signed)
Spoke with son regarding appt with Dr. Julien Nordmann.  Appt 01/21/13 at 11:15 labs and 11:30 Dr. Julien Nordmann.  Pt son verbalized understanding of time and place of appt

## 2013-01-19 NOTE — Patient Instructions (Signed)
Smoking Cessation Quitting smoking is important to your health and has many advantages. However, it is not always easy to quit since nicotine is a very addictive drug. Often times, people try 3 times or more before being able to quit. This document explains the best ways for you to prepare to quit smoking. Quitting takes hard work and a lot of effort, but you can do it. ADVANTAGES OF QUITTING SMOKING  You will live longer, feel better, and live better.  Your body will feel the impact of quitting smoking almost immediately.  Within 20 minutes, blood pressure decreases. Your pulse returns to its normal level.  After 8 hours, carbon monoxide levels in the blood return to normal. Your oxygen level increases.  After 24 hours, the chance of having a heart attack starts to decrease. Your breath, hair, and body stop smelling like smoke.  After 48 hours, damaged nerve endings begin to recover. Your sense of taste and smell improve.  After 72 hours, the body is virtually free of nicotine. Your bronchial tubes relax and breathing becomes easier.  After 2 to 12 weeks, lungs can hold more air. Exercise becomes easier and circulation improves.  The risk of having a heart attack, stroke, cancer, or lung disease is greatly reduced.  After 1 year, the risk of coronary heart disease is cut in half.  After 5 years, the risk of stroke falls to the same as a nonsmoker.  After 10 years, the risk of lung cancer is cut in half and the risk of other cancers decreases significantly.  After 15 years, the risk of coronary heart disease drops, usually to the level of a nonsmoker.  If you are pregnant, quitting smoking will improve your chances of having a healthy baby.  The people you live with, especially any children, will be healthier.  You will have extra money to spend on things other than cigarettes. QUESTIONS TO THINK ABOUT BEFORE ATTEMPTING TO QUIT You may want to talk about your answers with your  caregiver.  Why do you want to quit?  If you tried to quit in the past, what helped and what did not?  What will be the most difficult situations for you after you quit? How will you plan to handle them?  Who can help you through the tough times? Your family? Friends? A caregiver?  What pleasures do you get from smoking? What ways can you still get pleasure if you quit? Here are some questions to ask your caregiver:  How can you help me to be successful at quitting?  What medicine do you think would be best for me and how should I take it?  What should I do if I need more help?  What is smoking withdrawal like? How can I get information on withdrawal? GET READY  Set a quit date.  Change your environment by getting rid of all cigarettes, ashtrays, matches, and lighters in your home, car, or work. Do not let people smoke in your home.  Review your past attempts to quit. Think about what worked and what did not. GET SUPPORT AND ENCOURAGEMENT You have a better chance of being successful if you have help. You can get support in many ways.  Tell your family, friends, and co-workers that you are going to quit and need their support. Ask them not to smoke around you.  Get individual, group, or telephone counseling and support. Programs are available at local hospitals and health centers. Call your local health department for   information about programs in your area.  Spiritual beliefs and practices may help some smokers quit.  Download a "quit meter" on your computer to keep track of quit statistics, such as how long you have gone without smoking, cigarettes not smoked, and money saved.  Get a self-help book about quitting smoking and staying off of tobacco. LEARN NEW SKILLS AND BEHAVIORS  Distract yourself from urges to smoke. Talk to someone, go for a walk, or occupy your time with a task.  Change your normal routine. Take a different route to work. Drink tea instead of coffee.  Eat breakfast in a different place.  Reduce your stress. Take a hot bath, exercise, or read a book.  Plan something enjoyable to do every day. Reward yourself for not smoking.  Explore interactive web-based programs that specialize in helping you quit. GET MEDICINE AND USE IT CORRECTLY Medicines can help you stop smoking and decrease the urge to smoke. Combining medicine with the above behavioral methods and support can greatly increase your chances of successfully quitting smoking.  Nicotine replacement therapy helps deliver nicotine to your body without the negative effects and risks of smoking. Nicotine replacement therapy includes nicotine gum, lozenges, inhalers, nasal sprays, and skin patches. Some may be available over-the-counter and others require a prescription.  Antidepressant medicine helps people abstain from smoking, but how this works is unknown. This medicine is available by prescription.  Nicotinic receptor partial agonist medicine simulates the effect of nicotine in your brain. This medicine is available by prescription. Ask your caregiver for advice about which medicines to use and how to use them based on your health history. Your caregiver will tell you what side effects to look out for if you choose to be on a medicine or therapy. Carefully read the information on the package. Do not use any other product containing nicotine while using a nicotine replacement product.  RELAPSE OR DIFFICULT SITUATIONS Most relapses occur within the first 3 months after quitting. Do not be discouraged if you start smoking again. Remember, most people try several times before finally quitting. You may have symptoms of withdrawal because your body is used to nicotine. You may crave cigarettes, be irritable, feel very hungry, cough often, get headaches, or have difficulty concentrating. The withdrawal symptoms are only temporary. They are strongest when you first quit, but they will go away within  10 14 days. To reduce the chances of relapse, try to:  Avoid drinking alcohol. Drinking lowers your chances of successfully quitting.  Reduce the amount of caffeine you consume. Once you quit smoking, the amount of caffeine in your body increases and can give you symptoms, such as a rapid heartbeat, sweating, and anxiety.  Avoid smokers because they can make you want to smoke.  Do not let weight gain distract you. Many smokers will gain weight when they quit, usually less than 10 pounds. Eat a healthy diet and stay active. You can always lose the weight gained after you quit.  Find ways to improve your mood other than smoking. FOR MORE INFORMATION  www.smokefree.gov  Document Released: 12/12/2000 Document Revised: 06/19/2011 Document Reviewed: 03/29/2011 [employer] Patient Information 2014 [employer].  

## 2013-01-19 NOTE — Progress Notes (Signed)
Patient for assessment post SRS to brain.Denies headache or nausea.Takes dexamethasone 4 mg three times daily.Taper of steroid reviewed with patient by Dr.Moody.One month follow up in March given.Patient affect good and appetite has increased on dexamethasone.knows to call if any questions or concerns.

## 2013-01-19 NOTE — Progress Notes (Addendum)
  Radiation Oncology         [phone] ________________________________  Name: ANTUAN LIMES MRN: [id number]  Date: 01/19/2013  DOB: Aug 19, 1945   SPECIAL TREATMENT PROCEDURE   3D TREATMENT PLANNING AND DOSIMETRY: The patient's radiation plan was reviewed and approved by Dr. Saintclair Halsted from neurosurgery and radiation oncology prior to treatment. It showed 3-dimensional radiation distributions overlaid onto the planning CT/MRI image set. [facility] for the target structures as well as the organs at risk were reviewed. The documentation of the 3D plan and dosimetry are filed in the radiation oncology EMR.   NARRATIVE: The patient was brought to the TrueBeam stereotactic radiation treatment machine and placed supine on the CT couch. The head frame was applied, and the patient was set up for stereotactic radiosurgery. Neurosurgery was present for the set-up and delivery   SIMULATION VERIFICATION: In the couch zero-angle position, the patient underwent Exactrac imaging using the Brainlab system with orthogonal KV images. These were carefully aligned and repeated to confirm treatment position for each of the isocenters. The Exactrac snap film verification was repeated at each couch angle.   SPECIAL TREATMENT PROCEDURE: The patient received stereotactic radiosurgery to the following targets:  1. PTV1 left parietal 47mm  target was treated using 3 Arcs to a prescription dose of 20 Gy. ExacTrac Snap verification was performed for each couch angle.  2.  PTV2 Inf Lt cerebellar 1mm target was treated using 3 Arcs to a prescription dose of 20 Gy. ExacTrac Snap verification was performed for each couch angle.   STEREOTACTIC TREATMENT MANAGEMENT: Following delivery, the patient was transported to nursing in stable condition and monitored for possible acute effects. Vital signs were recorded . The patient tolerated treatment without significant acute effects, and was discharged to home in stable condition.  PLAN:  Follow-up in one month.   ------------------------------------------------  Jodelle Gross, MD, PhD

## 2013-01-19 NOTE — Addendum Note (Signed)
Encounter addended by: Marye Round, MD on: 01/19/2013  5:27 PM<BR>     Documentation filed: Notes Section

## 2013-01-19 NOTE — Progress Notes (Signed)
Checked in patient for follow up. No financial issues.

## 2013-01-19 NOTE — Addendum Note (Signed)
Encounter addended by: Arlyss Repress, RN on: 01/19/2013  5:46 PM<BR>     Documentation filed: Demographics Visit, Notes Section

## 2013-01-19 NOTE — Progress Notes (Signed)
  Radiation Oncology         [phone] ________________________________  Name: Carlos Beasley MRN: [id number]  Date: 01/14/2013  DOB: 1945-04-06  SIMULATION AND TREATMENT PLANNING NOTE  DIAGNOSIS:  Metastatic lung cancer  NARRATIVE:  The patient was brought to the [place] suite.  Identity was confirmed.  All relevant records and images related to the planned course of therapy were reviewed.  The patient freely provided informed written consent to proceed with treatment after reviewing the details related to the planned course of therapy. The consent form was witnessed and verified by the simulation staff. Intravenous access was established for contrast administration. Then, the patient was set-up in a stable reproducible supine position for radiation therapy.  A relocatable thermoplastic stereotactic head frame was fabricated for precise immobilization.  CT images were obtained.  Surface markings were placed.  The CT images were loaded into the planning software and fused with the patient's targeting MRI scan.  Then the target and avoidance structures were contoured.  Treatment planning then occurred.  The radiation prescription was entered and confirmed.  I have requested 3D planning  I have requested a DVH of the following structures: Brain stem, brain, left eye, right I, lenses, optic chiasm, target volumes, uninvolved brain, and normal tissue.    PLAN:  The patient will receive 20 Gy in 1 fraction to 2 intracranial metastases.  ________________________________  Jodelle Gross, MD, PhD

## 2013-01-19 NOTE — Progress Notes (Signed)
Pt son called wanting to re-schedule due to him being out of town  He would like pt to be seen 01/26/13.  I gave him appt time and he verbalized understanding

## 2013-01-21 ENCOUNTER — Other Ambulatory Visit: Payer: Medicare Other

## 2013-01-21 ENCOUNTER — Ambulatory Visit: Payer: Medicare Other | Admitting: Internal Medicine

## 2013-01-23 ENCOUNTER — Telehealth: Payer: Self-pay | Admitting: *Deleted

## 2013-01-23 ENCOUNTER — Other Ambulatory Visit: Payer: Self-pay | Admitting: Internal Medicine

## 2013-01-23 NOTE — Telephone Encounter (Signed)
Called Rx Ambien 10mg  : To take 1 po qhs prn,dispense 30 tabs with 1 refill ,called patient and informed him rx called to [facility] at [phone],  Per Dr.moody. Patient thanked MD and staff for return call today

## 2013-01-23 NOTE — Telephone Encounter (Signed)
error 

## 2013-01-26 ENCOUNTER — Telehealth: Payer: Self-pay | Admitting: Internal Medicine

## 2013-01-26 ENCOUNTER — Telehealth: Payer: Self-pay | Admitting: *Deleted

## 2013-01-26 ENCOUNTER — Encounter: Payer: Self-pay | Admitting: Internal Medicine

## 2013-01-26 ENCOUNTER — Ambulatory Visit (HOSPITAL_BASED_OUTPATIENT_CLINIC_OR_DEPARTMENT_OTHER): Payer: Medicare Other

## 2013-01-26 ENCOUNTER — Ambulatory Visit: Payer: Medicare Other

## 2013-01-26 ENCOUNTER — Other Ambulatory Visit: Payer: Medicare Other

## 2013-01-26 ENCOUNTER — Encounter: Payer: Self-pay | Admitting: *Deleted

## 2013-01-26 ENCOUNTER — Ambulatory Visit (HOSPITAL_COMMUNITY)
Admission: RE | Admit: 2013-01-26 | Discharge: 2013-01-26 | Disposition: A | Discharge status: Home / Self Care | Payer: Medicare Other | Source: Ambulatory Visit | Attending: Oncology | Admitting: Oncology

## 2013-01-26 ENCOUNTER — Ambulatory Visit (HOSPITAL_BASED_OUTPATIENT_CLINIC_OR_DEPARTMENT_OTHER): Payer: Medicare Other | Admitting: Internal Medicine

## 2013-01-26 VITALS — BP 120/87 | HR 72 | Temp 97.3°F | Resp 18 | Ht 76.0 in | Wt 164.5 lb

## 2013-01-26 DIAGNOSIS — K7689 Other specified diseases of liver: Secondary | ICD-10-CM | POA: Insufficient documentation

## 2013-01-26 DIAGNOSIS — F17200 Nicotine dependence, unspecified, uncomplicated: Secondary | ICD-10-CM

## 2013-01-26 DIAGNOSIS — R911 Solitary pulmonary nodule: Secondary | ICD-10-CM | POA: Insufficient documentation

## 2013-01-26 DIAGNOSIS — C7931 Secondary malignant neoplasm of brain: Secondary | ICD-10-CM

## 2013-01-26 DIAGNOSIS — R599 Enlarged lymph nodes, unspecified: Secondary | ICD-10-CM | POA: Insufficient documentation

## 2013-01-26 DIAGNOSIS — C3490 Malignant neoplasm of unspecified part of unspecified bronchus or lung: Secondary | ICD-10-CM

## 2013-01-26 DIAGNOSIS — C3410 Malignant neoplasm of upper lobe, unspecified bronchus or lung: Secondary | ICD-10-CM

## 2013-01-26 DIAGNOSIS — C7949 Secondary malignant neoplasm of other parts of nervous system: Secondary | ICD-10-CM

## 2013-01-26 DIAGNOSIS — C770 Secondary and unspecified malignant neoplasm of lymph nodes of head, face and neck: Secondary | ICD-10-CM

## 2013-01-26 LAB — GLUCOSE, CAPILLARY: GLUCOSE-CAPILLARY: 99 mg/dL (ref 70–99)

## 2013-01-26 MED ORDER — DEXAMETHASONE 4 MG PO TABS: ORAL_TABLET | ORAL | Status: DC

## 2013-01-26 MED ORDER — FLUDEOXYGLUCOSE F - 18 (FDG) INJECTION
20.3000 | Freq: Once | INTRAVENOUS | Status: AC | PRN
  Administered 2013-01-26: 20.3 via INTRAVENOUS

## 2013-01-26 MED ORDER — FOLIC ACID 1 MG PO TABS: 1.0000 mg | ORAL_TABLET | Freq: Every day | ORAL | Status: AC

## 2013-01-26 MED ORDER — PROCHLORPERAZINE MALEATE 10 MG PO TABS: 10.0000 mg | ORAL_TABLET | Freq: Four times a day (QID) | ORAL | Status: AC | PRN

## 2013-01-26 MED ORDER — CYANOCOBALAMIN 1000 MCG/ML IJ SOLN
1000.0000 ug | Freq: Once | INTRAMUSCULAR | Status: AC
  Administered 2013-01-26: 1000 ug via INTRAMUSCULAR

## 2013-01-26 NOTE — Progress Notes (Signed)
Checked in new patient with no financial issues. He has appt card.

## 2013-01-26 NOTE — Patient Instructions (Signed)
Smoking Cessation Quitting smoking is important to your health and has many advantages. However, it is not always easy to quit since nicotine is a very addictive drug. Often times, people try 3 times or more before being able to quit. This document explains the best ways for you to prepare to quit smoking. Quitting takes hard work and a lot of effort, but you can do it. ADVANTAGES OF QUITTING SMOKING  You will live longer, feel better, and live better.  Your body will feel the impact of quitting smoking almost immediately.  Within 20 minutes, blood pressure decreases. Your pulse returns to its normal level.  After 8 hours, carbon monoxide levels in the blood return to normal. Your oxygen level increases.  After 24 hours, the chance of having a heart attack starts to decrease. Your breath, hair, and body stop smelling like smoke.  After 48 hours, damaged nerve endings begin to recover. Your sense of taste and smell improve.  After 72 hours, the body is virtually free of nicotine. Your bronchial tubes relax and breathing becomes easier.  After 2 to 12 weeks, lungs can hold more air. Exercise becomes easier and circulation improves.  The risk of having a heart attack, stroke, cancer, or lung disease is greatly reduced.  After 1 year, the risk of coronary heart disease is cut in half.  After 5 years, the risk of stroke falls to the same as a nonsmoker.  After 10 years, the risk of lung cancer is cut in half and the risk of other cancers decreases significantly.  After 15 years, the risk of coronary heart disease drops, usually to the level of a nonsmoker.  If you are pregnant, quitting smoking will improve your chances of having a healthy baby.  The people you live with, especially any children, will be healthier.  You will have extra money to spend on things other than cigarettes. QUESTIONS TO THINK ABOUT BEFORE ATTEMPTING TO QUIT You may want to talk about your answers with your  caregiver.  Why do you want to quit?  If you tried to quit in the past, what helped and what did not?  What will be the most difficult situations for you after you quit? How will you plan to handle them?  Who can help you through the tough times? Your family? Friends? A caregiver?  What pleasures do you get from smoking? What ways can you still get pleasure if you quit? Here are some questions to ask your caregiver:  How can you help me to be successful at quitting?  What medicine do you think would be best for me and how should I take it?  What should I do if I need more help?  What is smoking withdrawal like? How can I get information on withdrawal? GET READY  Set a quit date.  Change your environment by getting rid of all cigarettes, ashtrays, matches, and lighters in your home, car, or work. Do not let people smoke in your home.  Review your past attempts to quit. Think about what worked and what did not. GET SUPPORT AND ENCOURAGEMENT You have a better chance of being successful if you have help. You can get support in many ways.  Tell your family, friends, and co-workers that you are going to quit and need their support. Ask them not to smoke around you.  Get individual, group, or telephone counseling and support. Programs are available at local hospitals and health centers. Call your local health department for   information about programs in your area.  Spiritual beliefs and practices may help some smokers quit.  Download a "quit meter" on your computer to keep track of quit statistics, such as how long you have gone without smoking, cigarettes not smoked, and money saved.  Get a self-help book about quitting smoking and staying off of tobacco. LEARN NEW SKILLS AND BEHAVIORS  Distract yourself from urges to smoke. Talk to someone, go for a walk, or occupy your time with a task.  Change your normal routine. Take a different route to work. Drink tea instead of coffee.  Eat breakfast in a different place.  Reduce your stress. Take a hot bath, exercise, or read a book.  Plan something enjoyable to do every day. Reward yourself for not smoking.  Explore interactive web-based programs that specialize in helping you quit. GET MEDICINE AND USE IT CORRECTLY Medicines can help you stop smoking and decrease the urge to smoke. Combining medicine with the above behavioral methods and support can greatly increase your chances of successfully quitting smoking.  Nicotine replacement therapy helps deliver nicotine to your body without the negative effects and risks of smoking. Nicotine replacement therapy includes nicotine gum, lozenges, inhalers, nasal sprays, and skin patches. Some may be available over-the-counter and others require a prescription.  Antidepressant medicine helps people abstain from smoking, but how this works is unknown. This medicine is available by prescription.  Nicotinic receptor partial agonist medicine simulates the effect of nicotine in your brain. This medicine is available by prescription. Ask your caregiver for advice about which medicines to use and how to use them based on your health history. Your caregiver will tell you what side effects to look out for if you choose to be on a medicine or therapy. Carefully read the information on the package. Do not use any other product containing nicotine while using a nicotine replacement product.  RELAPSE OR DIFFICULT SITUATIONS Most relapses occur within the first 3 months after quitting. Do not be discouraged if you start smoking again. Remember, most people try several times before finally quitting. You may have symptoms of withdrawal because your body is used to nicotine. You may crave cigarettes, be irritable, feel very hungry, cough often, get headaches, or have difficulty concentrating. The withdrawal symptoms are only temporary. They are strongest when you first quit, but they will go away within  10 14 days. To reduce the chances of relapse, try to:  Avoid drinking alcohol. Drinking lowers your chances of successfully quitting.  Reduce the amount of caffeine you consume. Once you quit smoking, the amount of caffeine in your body increases and can give you symptoms, such as a rapid heartbeat, sweating, and anxiety.  Avoid smokers because they can make you want to smoke.  Do not let weight gain distract you. Many smokers will gain weight when they quit, usually less than 10 pounds. Eat a healthy diet and stay active. You can always lose the weight gained after you quit.  Find ways to improve your mood other than smoking. FOR MORE INFORMATION  www.smokefree.gov  Document Released: 12/12/2000 Document Revised: 06/19/2011 Document Reviewed: 03/29/2011 [employer] Patient Information 2014 [employer].  

## 2013-01-26 NOTE — Progress Notes (Signed)
Spoke with Carlos Beasley today.  We spoke about lung cancer and smoking cessation.  Pt states he is ready to quit and has a date of 01/29/13 to stop smoking.  We discussed brand switching starting today until quit date.  He verbalized understanding

## 2013-01-26 NOTE — Progress Notes (Signed)
[facility] [phone]   [phone]  NEW VISIT OFFICE PROGRESS NOTE  Carlos Bales, MD [place] [id number]  DIAGNOSIS: Stage IV (T1a, N2, M1b) non-small cell lung cancer, adenocarcinoma with brain metastasis diagnosed in January of 2015.  PRIOR THERAPY:status post stereotactic radiotherapy to the left parietal and left cerebellar lesions under the care of Dr. Lisbeth Renshaw completed 01/19/2013.  CURRENT THERAPY: Systemic chemotherapy with carboplatin for AUC of 5 and Alimta 500 mg/M2 given every 3 weeks. First cycle on 02/03/2013.  INTERVAL HISTORY: Carlos Beasley 68 y.o. male returns to the clinic today for new visit evaluation with me. The patient mentions that in early January of 2015 he was admitted to [facility] complaining of weakness on the right side of his body in addition to seizure activity in the right arm and right leg. He was hypertensive at that time with systolic blood pressure over 200. During his evaluation CT scan of the head followed by MRI of the brain showed 11 x 13 mm enhancing lesion in the left parietal lobe with pronounced regional vasogenic edema. Second enhancing focus in the inferior cerebellum on the left measuring 6 mm in diameter with mild regional edema. The differential diagnosis is metastatic disease versus areas of focal cerebritis.  CT scan of the chest, abdomen and pelvis performed on 01/04/2013 showed mediastinal lymphadenopathy concerning for metastatic adenopathy. There was a small right upper lobe spiculated nodule measuring 0.7 x 0.7 suspicious to represent primary bronchogenic carcinoma. There was also right supraclavicular enlarged lymph node and low density lesions in the liver likely representing benign cysts.  On 01/06/2013 the patient underwent ultrasound-guided core biopsy of the right supraclavicular lymph node by interventional radiology. The final pathology (Accession:  [id number]) showed metastatic carcinoma. The malignant cells are positive for Napsin A, TTF-1, and cytokeratin-7. They are negative for thyroglobulin, CDX2, cytokeratin 20. Overall, the findings are consistent with metastatic adenocarcinoma of lung origin. The molecular studies showed negative EGFR mutation and negative ALK gene translocation. The patient was treated with stereotactic radiotherapy to the metastatic brain lesions under the care of Dr. Lisbeth Renshaw on 01/19/2013. He is here today for evaluation and discussion of his systemic treatment options. The patient is feeling fine with no specific complaints except for mild cough and fatigue. He denied having any significant chest pain but continues to have shortness breath with exertion with no hemoptysis. He denied having any significant weight loss or night sweats.  Family history unremarkable for any malignancy. Social history: The patient is single and he lives with his father. He has 2 sons. He has a long history of smoking and unfortunately continues to smoke 2 cigarettes every day. He denied having any history of alcohol abuse but he has a history of drug abuse in the past.   MEDICAL HISTORY: Past Medical History  Diagnosis Date  . Medical history non-contributory   . Hypertension   . Lung cancer     ALLERGIES:  has No Known Allergies.  MEDICATIONS:  Current Outpatient Prescriptions  Medication Sig Dispense Refill  . amLODipine (NORVASC) 10 MG tablet Take 1 tablet (10 mg total) by mouth daily.  30 tablet  1  . aspirin EC 325 MG EC tablet Take 1 tablet (325 mg total) by mouth daily.  30 tablet  0  . dexamethasone (DECADRON) 4 MG tablet Take 1 tablet (4 mg total) by mouth every 6 (six) hours.  120 tablet  1  .  hydrALAZINE (APRESOLINE) 25 MG tablet Take 1 tablet (25 mg total) by mouth every 8 (eight) hours.  90 tablet  1  . levETIRAcetam (KEPPRA) 1000 MG tablet Take 1 tablet (1,000 mg total) by mouth 2 (two) times daily.  60 tablet  1  .  lisinopril (PRINIVIL,ZESTRIL) 5 MG tablet Take 1 tablet (5 mg total) by mouth daily.  30 tablet  1  . pantoprazole (PROTONIX) 40 MG tablet Take 1 tablet (40 mg total) by mouth daily.  30 tablet  1  . zolpidem (AMBIEN) 10 MG tablet Take 10 mg by mouth at bedtime as needed for sleep. With 1 refill called rx to [facility] [phone]       Current Facility-Administered Medications  Medication Dose Route Frequency Provider Last Rate Last Dose  . cyanocobalamin ((VITAMIN B-12)) injection 1,000 mcg  1,000 mcg Intramuscular Once Curt Bears, MD        SURGICAL HISTORY:  Past Surgical History  Procedure Laterality Date  . No past surgeries      REVIEW OF SYSTEMS:  Constitutional: positive for fatigue Eyes: negative Ears, nose, mouth, throat, and face: negative Respiratory: positive for cough and dyspnea on exertion Cardiovascular: negative Gastrointestinal: negative Genitourinary:negative Integument/breast: negative Hematologic/lymphatic: negative Musculoskeletal:negative Neurological: negative Behavioral/Psych: negative Endocrine: negative Allergic/Immunologic: negative   PHYSICAL EXAMINATION: General appearance: alert, cooperative, fatigued and no distress Head: Normocephalic, without obvious abnormality, atraumatic Neck: no adenopathy, no JVD, supple, symmetrical, trachea midline and thyroid not enlarged, symmetric, no tenderness/mass/nodules Lymph nodes: Cervical, supraclavicular, and axillary nodes normal. Resp: clear to auscultation bilaterally Back: symmetric, no curvature. ROM normal. No CVA tenderness. Cardio: regular rate and rhythm, S1, S2 normal, no murmur, click, rub or gallop GI: soft, non-tender; bowel sounds normal; no masses,  no organomegaly Extremities: extremities normal, atraumatic, no cyanosis or edema Neurologic: Alert and oriented X 3, normal strength and tone. Normal symmetric reflexes. Normal coordination and gait  ECOG PERFORMANCE STATUS: 1 -  Symptomatic but completely ambulatory  Blood pressure 120/87, pulse 72, temperature 97.3 F (36.3 C), temperature source Oral, resp. rate 18, height 6' 4"  (1.93 m), weight 164 lb 8 oz (74.617 kg), SpO2 100.00%.  LABORATORY DATA: Lab Results  Component Value Date   WBC 9.5 01/19/2013   HGB 12.0* 01/19/2013   HCT 36.7* 01/19/2013   MCV 89.3 01/19/2013   PLT 214 01/19/2013      Chemistry      Component Value Date/Time   NA 138 01/19/2013 0842   NA 136* 01/08/2013 1300   K 4.7 01/19/2013 0842   K 4.3 01/08/2013 1300   CL 101 01/08/2013 1300   CO2 27 01/19/2013 0842   CO2 23 01/08/2013 1300   BUN 28.5* 01/19/2013 0842   BUN 15 01/08/2013 1300   CREATININE 1.4* 01/19/2013 0842   CREATININE 1.03 01/08/2013 1300      Component Value Date/Time   CALCIUM 9.2 01/19/2013 0842   CALCIUM 8.5 01/08/2013 1300   ALKPHOS 48 01/19/2013 0842   ALKPHOS 54 01/08/2013 1300   AST 13 01/19/2013 0842   AST 14 01/08/2013 1300   ALT 16 01/19/2013 0842   ALT 20 01/08/2013 1300   BILITOT 0.33 01/19/2013 0842   BILITOT <0.2* 01/08/2013 1300       RADIOGRAPHIC STUDIES: Ct Head Wo Contrast  01/02/2013   CLINICAL DATA:  Right-sided weakness.  EXAM: CT HEAD WITHOUT CONTRAST  TECHNIQUE: Contiguous axial images were obtained from the base of the skull through the vertex without intravenous contrast.  COMPARISON:  None.  FINDINGS: Sinuses/Soft tissues: Clear paranasal sinuses and mastoid air cells.  Intracranial: Large area of left parietal hypoattenuation and mass effect. This causes partial effacement of the occipital horn of the left lateral ventricle. 1-2 mm of left-to-right midline shift.  No complicating hemorrhage.  Basal cisterns patent.  Apparent hypoattenuation in the left cerebellar hemisphere on image 6/series 2 is favored to be a artifactual.  No hydrocephalus, intra-axial, or extra-axial fluid collection.  IMPRESSION: 1. Large area of edema within the left parietal lobe. Given the clinical history, this most likely represents a  subacute infarct. Given the appearance, differential consideration includes a mass lesion such as a metastasis. There is mass effect and minimal midline shift. Critical test results telephoned to.Dr. Regenia Skeeter. at the time of interpretation at 2:30 p.m.on 01/02/2013. 2. Probable artifactual hypoattenuation in the left cerebellar hemisphere. If the patient undergoes MRI, recommend attention to this area.   Electronically Signed   By: Abigail Miyamoto M.D.   On: 01/02/2013 14:31   Ct Chest W Contrast  01/04/2013   CLINICAL DATA:  Brain lesion on comparison MRI. Concern for metastatic disease.  EXAM: CT CHEST, ABDOMEN, AND PELVIS WITH CONTRAST  TECHNIQUE: Multidetector CT imaging of the chest, abdomen and pelvis was performed following the standard protocol during bolus administration of intravenous contrast.  CONTRAST:  128m OMNIPAQUE IOHEXOL 300 MG/ML  SOLN  COMPARISON:  [place] dated 09/08/2007; MR HEAD WO/W CM dated 01/03/2013  FINDINGS: CT CHEST FINDINGS  There is little body fat in the patient's thorax . No axillary lymphadenopathy. Potential right supraclavicular lymph node measuring 16 mm (image number 1, series 5). There is enlarged right lower paratracheal lymph node measuring 15 mm short axis. Left lower paratracheal lymph node measures 12 mm. There is a large subcarinal lymph node measuring 17 mm short axis. Prominent right hilar lymph node measures 10 mm (image 25, series 2).  Review of the lung parenchyma demonstrates a spiculated nodule at the superior aspect of the right upper lobe measuring 7 x 7 mm (image 11, is series 6). This is also seen on image number 2 of series 4. No additional suspicious pulmonary nodules. Central airways are normal.  CT ABDOMEN AND PELVIS FINDINGS  Low-density lesion the posterior right hepatic lobe has simple fluid attenuation consistent with benign cyst. 3 mm hypodense lesion in the central left hepatic lobe (image 52) is too small to characterize. The gallbladder, pancreas,  spleen, adrenal glands, and kidneys are normal.  Stomach, small bowel, and colon are unremarkable.  Abdominal aorta is normal caliber. No retroperitoneal periportal lymphadenopathy.  No free fluid the pelvis. The prostate gland and bladder normal. No pelvic lymphadenopathy. No aggressive osseous lesion.  IMPRESSION: 1. Mediastinal lymphadenopathy is concerning for metastatic adenopathy. 2. Small right upper lobe spiculated nodule may represent primary bronchogenic carcinoma. 3. Potential right supraclavicular enlarged lymph node. Ultrasound evaluation could delineate this lymph node. 4. Low-density lesions in the liver likely represent benign cysts. The smaller cyst is too small to characterize. 5. Patient may ultimately benefit from an outpatient FDG PET-CT staging exam.   Electronically Signed   By: SSuzy BouchardM.D.   On: 01/04/2013 15:20   Mr BJeri CosWASContrast  01/13/2013   CLINICAL DATA:  68year old male with intracranial masses diagnosed on 01/03/2013 following seizure activity and weakness. Subsequent lymph node biopsy demonstrating metastatic carcinoma, with features favoring adenocarcinoma of lung origin. Stereotactic radiosurgery planning study. Subsequent encounter.  EXAM: MRI HEAD WITHOUT AND WITH CONTRAST  TECHNIQUE: Multiplanar, multiecho  pulse sequences of the brain and surrounding structures were obtained without and with intravenous contrast.  CONTRAST:  70m MULTIHANCE GADOBENATE DIMEGLUMINE 529 MG/ML IV SOLN  COMPARISON:  Brain MRI 01/03/2013.  FINDINGS: Stable size of the inferior left cerebellar enhancing mass, 8 mm diameter. Mild associated edema is stable without significant mass effect.  Severe edema in the posterior left hemisphere re- identified in the region of a stable 13 mm enhancing left parietal lobe mass (series 10, image 122). A edema has mildly regressed, as has mass effect on the left lateral ventricle, but considerable edema remains.  Near that mass is a small area of  chronic micro hemorrhage, Stable and presumably unrelated. No enhancement (series 5, image 21 and series 10, image 121).  No additional enhancing brain mass identified.  No restricted diffusion or evidence of acute infarction. No ventriculomegaly. Basilar cisterns remain patent. Major intracranial vascular flow voids are preserved. No acute intracranial hemorrhage identified. Negative pituitary, cervicomedullary junction, and visualized cervical spine. Visualized bone marrow signal is within normal limits.  Visualized orbit soft tissues are within normal limits. Stable paranasal sinuses and mastoids. Visible internal auditory structures appear normal. Visualized scalp soft tissues are within normal limits.  IMPRESSION: 1. Two left brain small metastases are stable. Lesions annotated on series 10. Mildly decreased but considerable edema associated with the left parietal lesion. No increased mass effect. 2. Presumably unrelated chronic micro hemorrhage in the left parietal lobe. No associated enhancement. 3. No new intracranial abnormality.   Electronically Signed   By: LLars PinksM.D.   On: 01/13/2013 15:18   Mr BJeri CosWGUContrast  01/03/2013   CLINICAL DATA:  Seizure activity. History of drug abuse. Right-sided weakness.  EXAM: MRI HEAD WITHOUT AND WITH CONTRAST  TECHNIQUE: Multiplanar, multiecho pulse sequences of the brain and surrounding structures were obtained without and with intravenous contrast.  CONTRAST:  120mMULTIHANCE GADOBENATE DIMEGLUMINE 529 MG/ML IV SOLN  COMPARISON:  Head CT 01/02/2013  FINDINGS: There is no evidence of ischemic infarction. Within the left parietal lobe, there is an 11 x 13 mm enhancing focus with pronounced regional vasogenic edema. A 2nd enhancing focus is present within the inferior cerebellum on the left measuring 6 mm in diameter. There is minimal edema in that region. Elsewhere, there are a few punctate foci of T2 and FLAIR signal in the hemispheric white matter not  associated with abnormal enhancement and most likely indicative of old small vessel insults. There is a small focus of hemorrhage within the region of edema in the left parietal lobe. This is no larger than 3-4 mm. No hydrocephalus or extra-axial collection. Because of the edema, there is mass-effect on the posterior aspect of the left lateral ventricle. No midline shift. No pituitary mass. Sinuses are clear. No skull or skullbase lesion.  IMPRESSION: 11 x 13 mm enhancing lesion in the left parietal lobe with pronounced regional vasogenic edema. Second enhancing focus in the inferior cerebellum on the left measuring 6 mm in diameter with mild regional edema. The differential diagnosis is metastatic disease versus areas of focal cerebritis. Given the history of drug abuse, infectious cerebritis is certainly possible. The amount of vasogenic edema associated with the left parietal lesion is extreme, a finding often associated with acute cerebritis. .   Electronically Signed   By: MaNelson Chimes.D.   On: 01/03/2013 19:30   Ct Abdomen Pelvis W Contrast  01/04/2013   CLINICAL DATA:  Brain lesion on comparison MRI. Concern for metastatic disease.  EXAM: CT CHEST, ABDOMEN, AND PELVIS WITH CONTRAST  TECHNIQUE: Multidetector CT imaging of the chest, abdomen and pelvis was performed following the standard protocol during bolus administration of intravenous contrast.  CONTRAST:  15m OMNIPAQUE IOHEXOL 300 MG/ML  SOLN  COMPARISON:  [place] dated 09/08/2007; MR HEAD WO/W CM dated 01/03/2013  FINDINGS: CT CHEST FINDINGS  There is little body fat in the patient's thorax . No axillary lymphadenopathy. Potential right supraclavicular lymph node measuring 16 mm (image number 1, series 5). There is enlarged right lower paratracheal lymph node measuring 15 mm short axis. Left lower paratracheal lymph node measures 12 mm. There is a large subcarinal lymph node measuring 17 mm short axis. Prominent right hilar lymph node measures 10 mm  (image 25, series 2).  Review of the lung parenchyma demonstrates a spiculated nodule at the superior aspect of the right upper lobe measuring 7 x 7 mm (image 11, is series 6). This is also seen on image number 2 of series 4. No additional suspicious pulmonary nodules. Central airways are normal.  CT ABDOMEN AND PELVIS FINDINGS  Low-density lesion the posterior right hepatic lobe has simple fluid attenuation consistent with benign cyst. 3 mm hypodense lesion in the central left hepatic lobe (image 52) is too small to characterize. The gallbladder, pancreas, spleen, adrenal glands, and kidneys are normal.  Stomach, small bowel, and colon are unremarkable.  Abdominal aorta is normal caliber. No retroperitoneal periportal lymphadenopathy.  No free fluid the pelvis. The prostate gland and bladder normal. No pelvic lymphadenopathy. No aggressive osseous lesion.  IMPRESSION: 1. Mediastinal lymphadenopathy is concerning for metastatic adenopathy. 2. Small right upper lobe spiculated nodule may represent primary bronchogenic carcinoma. 3. Potential right supraclavicular enlarged lymph node. Ultrasound evaluation could delineate this lymph node. 4. Low-density lesions in the liver likely represent benign cysts. The smaller cyst is too small to characterize. 5. Patient may ultimately benefit from an outpatient FDG PET-CT staging exam.   Electronically Signed   By: SSuzy BouchardM.D.   On: 01/04/2013 15:20   [place]  01/06/2013   CLINICAL DATA:  Abnormal right supraclavicular lymph node.  EXAM: ULTRASOUND GUIDED core BIOPSY OF a right supraclavicular lymph node  COMPLICATIONS: None.  FINDINGS: Images document needle placement in the right supraclavicular lymph node. Post biopsy images demonstrate no evidence of hemorrhage.  IMPRESSION: Successful right supraclavicular lymph node core biopsy.  : PROCEDURE:  The procedure, risks, benefits, and alternatives were explained to the patient. Questions regarding the procedure  were encouraged and answered. The patient understands and consents to the procedure.  The right neck was prepped with Betadine in a sterile fashion, and a sterile drape was applied covering the operative field. A sterile gown and sterile gloves were used for the procedure. Local anesthesia was provided with 1% Lidocaine.  Under sonographic guidance, 3 18 gauge core biopsies of the enlarged right supraclavicular lymph node were obtained without complication. Post biopsy imaging was performed.   Electronically Signed   By: AMaryclare BeanM.D.   On: 01/06/2013 15:04    ASSESSMENT AND PLAN: This is a very pleasant 68years old [place] male recently diagnosed with metastatic non-small cell lung cancer, adenocarcinoma with negative EGFR mutation and negative ALK gene translocation. The patient has brain metastasis treated with stereotactic radiotherapy. I have a lengthy discussion with the patient today about his current disease stage, prognosis and treatment options. I indicated to the patient that he has incurable disease and all the treatment would be  of palliative nature. I gave the patient the option of palliative care and hospice referral versus consideration of systemic chemotherapy with carboplatin for AUC of 5 and Alimta 500 mg/M2 every 3 weeks. The patient is interested in treatment. I discussed with him the adverse effect of the chemotherapy including but not limited to alopecia, myelosuppression, nausea and vomiting, peripheral neuropathy, liver or in dysfunction. I expect the patient to start the first cycle of his treatment next week. He would have a chemotherapy education class before starting the first dose of his chemotherapy. The patient will receive vitamin B12 injection today. I will call his pharmacy with prescription for Compazine 10 mg by mouth every 6 hours as needed for nausea, Decadron 4 mg by mouth twice a day the day before, day of and day after the chemotherapy in addition to folic  acid 1 mg by mouth daily. The patient would come back for follow up visit in 2 weeks for reevaluation and management any adverse effect of his chemotherapy. He was advised to call immediately if he has any concerning symptoms in the interval.  The patient voices understanding of current disease status and treatment options and is in agreement with the current care plan.  All questions were answered. The patient knows to call the clinic with any problems, questions or concerns. We can certainly see the patient much sooner if necessary.  I spent 40 minutes counseling the patient face to face. The total time spent in the appointment was 55 minutes.  Disclaimer: This note was dictated with voice recognition software. Similar sounding words can inadvertently be transcribed and may not be corrected upon review.

## 2013-01-26 NOTE — Telephone Encounter (Signed)
GAve pt appt for lab,md and chemo class for thusday , emailed [place] regarding chemo

## 2013-01-26 NOTE — Telephone Encounter (Signed)
Spoke to pt's son, [name] for 14 minutes regarding pt status, treatment plan and schedule.  SLJ

## 2013-01-26 NOTE — Telephone Encounter (Signed)
Per staff message and POF I have scheduled appts.  JMW  

## 2013-01-27 ENCOUNTER — Encounter (HOSPITAL_COMMUNITY): Payer: Self-pay

## 2013-01-27 NOTE — Progress Notes (Signed)
OFFICE PROGRESS NOTE  CC  Carlos Bales, MD [place] [id number]  DIAGNOSIS: 68 year old gentleman with new diagnosis of stage IV lung cancer  PRIOR THERAPY:Graf patient was originally seen by me during his recent hospitalization to [facility]. He presented in January 02 2013 with right-sided weakness and tonic clonic seizures of the right arm and right leg and hypertensive urgency with systolic blood pressures greater than 200. He had a CT of the head performed that showed large area of edema in the left parietal lobe felt to represent a subacute infarct versus a mass lesion. Subsequent MRI revealed 11 x 13 mm enhancing lesion within the left parietal lobe with vasogenic edema. There was also a second enhancing focus noted. He subsequently had metastatic workup performed including CT of the chest abdomen and pelvis that showed mediastinal adenopathy. A small right upper lobe spiculated nodule in the right supraclavicular enlarged lymph node. Patient went on to have a biopsy performed of the supraclavicular lymph node on 01/06/2013. The pathology was consistent with metastatic adenocarcinoma lung primary. Patient was seen during his hospitalization by Dr. Kyung Rudd. He was also placed on Decadron and Keppra.  CURRENT THERAPY:proceed with SRS  INTERVAL HISTORY: Carlos Beasley 68 y.o. male returns forfollowup visit post hospitalization. Clinically he seems to be doing well. He has not had recurrence of weakness or seizure-like activity. He remains on Decadron and Keppra. Today he is accompanied by his son. Patient denies any headaches double vision blurring of vision fevers chills or night sweats. remainder of the 10 point review of systems is unremarkable.  MEDICAL HISTORY: Past Medical History  Diagnosis Date  . Medical history non-contributory   . Hypertension   . Lung cancer     ALLERGIES:  has No Known Allergies.  MEDICATIONS:  Current Outpatient  Prescriptions  Medication Sig Dispense Refill  . amLODipine (NORVASC) 10 MG tablet Take 1 tablet (10 mg total) by mouth daily.  30 tablet  1  . aspirin EC 325 MG EC tablet Take 1 tablet (325 mg total) by mouth daily.  30 tablet  0  . dexamethasone (DECADRON) 4 MG tablet Take 1 tablet (4 mg total) by mouth every 6 (six) hours.  120 tablet  1  . hydrALAZINE (APRESOLINE) 25 MG tablet Take 1 tablet (25 mg total) by mouth every 8 (eight) hours.  90 tablet  1  . levETIRAcetam (KEPPRA) 1000 MG tablet Take 1 tablet (1,000 mg total) by mouth 2 (two) times daily.  60 tablet  1  . lisinopril (PRINIVIL,ZESTRIL) 5 MG tablet Take 1 tablet (5 mg total) by mouth daily.  30 tablet  1  . pantoprazole (PROTONIX) 40 MG tablet Take 1 tablet (40 mg total) by mouth daily.  30 tablet  1  . dexamethasone (DECADRON) 4 MG tablet 4 mg by mouth twice a day the day before, day of and day after the chemotherapy every 3 weeks  40 tablet  1  . folic acid (FOLVITE) 1 MG tablet Take 1 tablet (1 mg total) by mouth daily.  30 tablet  3  . prochlorperazine (COMPAZINE) 10 MG tablet Take 1 tablet (10 mg total) by mouth every 6 (six) hours as needed for nausea or vomiting.  60 tablet  0  . zolpidem (AMBIEN) 10 MG tablet Take 10 mg by mouth at bedtime as needed for sleep. With 1 refill called rx to [facility] [phone]       No current facility-administered medications for this  visit.    SURGICAL HISTORY:  Past Surgical History  Procedure Laterality Date  . No past surgeries      REVIEW OF SYSTEMS:  Pertinent items are noted in HPI.     PHYSICAL EXAMINATION: Blood pressure 146/67, pulse 67, temperature 98.3 F (36.8 C), temperature source Oral, resp. rate 18, height 6\' 4"  (1.93 m), weight 164 lb 1.6 oz (74.435 kg). Body mass index is 19.98 kg/(m^2). ECOG PERFORMANCE STATUS: 1 - Symptomatic but completely ambulatory   General appearance: alert, cooperative and appears stated age Lymph nodes: Supraclavicular adenopathy:  Positive Resp: clear to auscultation bilaterally Back: symmetric, no curvature. ROM normal. No CVA tenderness. Cardio: regular rate and rhythm GI: soft, non-tender; bowel sounds normal; no masses,  no organomegaly Extremities: extremities normal, atraumatic, no cyanosis or edema Neuro: Nonfocal  LABORATORY DATA: Lab Results  Component Value Date   WBC 9.5 01/19/2013   HGB 12.0* 01/19/2013   HCT 36.7* 01/19/2013   MCV 89.3 01/19/2013   PLT 214 01/19/2013      Chemistry      Component Value Date/Time   NA 138 01/19/2013 0842   NA 136* 01/08/2013 1300   K 4.7 01/19/2013 0842   K 4.3 01/08/2013 1300   CL 101 01/08/2013 1300   CO2 27 01/19/2013 0842   CO2 23 01/08/2013 1300   BUN 28.5* 01/19/2013 0842   BUN 15 01/08/2013 1300   CREATININE 1.4* 01/19/2013 0842   CREATININE 1.03 01/08/2013 1300      Component Value Date/Time   CALCIUM 9.2 01/19/2013 0842   CALCIUM 8.5 01/08/2013 1300   ALKPHOS 48 01/19/2013 0842   ALKPHOS 54 01/08/2013 1300   AST 13 01/19/2013 0842   AST 14 01/08/2013 1300   ALT 16 01/19/2013 0842   ALT 20 01/08/2013 1300   BILITOT 0.33 01/19/2013 0842   BILITOT <0.2* 01/08/2013 1300       RADIOGRAPHIC STUDIES:  Ct Head Wo Contrast  01/02/2013   CLINICAL DATA:  Right-sided weakness.  EXAM: CT HEAD WITHOUT CONTRAST  TECHNIQUE: Contiguous axial images were obtained from the base of the skull through the vertex without intravenous contrast.  COMPARISON:  None.  FINDINGS: Sinuses/Soft tissues: Clear paranasal sinuses and mastoid air cells.  Intracranial: Large area of left parietal hypoattenuation and mass effect. This causes partial effacement of the occipital horn of the left lateral ventricle. 1-2 mm of left-to-right midline shift.  No complicating hemorrhage.  Basal cisterns patent.  Apparent hypoattenuation in the left cerebellar hemisphere on image 6/series 2 is favored to be a artifactual.  No hydrocephalus, intra-axial, or extra-axial fluid collection.  IMPRESSION: 1. Large area of edema  within the left parietal lobe. Given the clinical history, this most likely represents a subacute infarct. Given the appearance, differential consideration includes a mass lesion such as a metastasis. There is mass effect and minimal midline shift. Critical test results telephoned to.Dr. Regenia Skeeter. at the time of interpretation at 2:30 p.m.on 01/02/2013. 2. Probable artifactual hypoattenuation in the left cerebellar hemisphere. If the patient undergoes MRI, recommend attention to this area.   Electronically Signed   By: Abigail Miyamoto M.D.   On: 01/02/2013 14:31   Ct Chest W Contrast  01/04/2013   CLINICAL DATA:  Brain lesion on comparison MRI. Concern for metastatic disease.  EXAM: CT CHEST, ABDOMEN, AND PELVIS WITH CONTRAST  TECHNIQUE: Multidetector CT imaging of the chest, abdomen and pelvis was performed following the standard protocol during bolus administration of intravenous contrast.  CONTRAST:  166mL  OMNIPAQUE IOHEXOL 300 MG/ML  SOLN  COMPARISON:  US SCROTUM dated 09/08/2007; MR HEAD WO/W CM dated 01/03/2013  FINDINGS: CT CHEST FINDINGS  There is little body fat in the patient's thorax . No axillary lymphadenopathy. Potential right supraclavicular lymph node measuring 16 mm (image number 1, series 5). There is enlarged right lower paratracheal lymph node measuring 15 mm short axis. Left lower paratracheal lymph node measures 12 mm. There is a large subcarinal lymph node measuring 17 mm short axis. Prominent right hilar lymph node measures 10 mm (image 25, series 2).  Review of the lung parenchyma demonstrates a spiculated nodule at the superior aspect of the right upper lobe measuring 7 x 7 mm (image 11, is series 6). This is also seen on image number 2 of series 4. No additional suspicious pulmonary nodules. Central airways are normal.  CT ABDOMEN AND PELVIS FINDINGS  Low-density lesion the posterior right hepatic lobe has simple fluid attenuation consistent with benign cyst. 3 mm hypodense lesion in the  central left hepatic lobe (image 52) is too small to characterize. The gallbladder, pancreas, spleen, adrenal glands, and kidneys are normal.  Stomach, small bowel, and colon are unremarkable.  Abdominal aorta is normal caliber. No retroperitoneal periportal lymphadenopathy.  No free fluid the pelvis. The prostate gland and bladder normal. No pelvic lymphadenopathy. No aggressive osseous lesion.  IMPRESSION: 1. Mediastinal lymphadenopathy is concerning for metastatic adenopathy. 2. Small right upper lobe spiculated nodule may represent primary bronchogenic carcinoma. 3. Potential right supraclavicular enlarged lymph node. Ultrasound evaluation could delineate this lymph node. 4. Low-density lesions in the liver likely represent benign cysts. The smaller cyst is too small to characterize. 5. Patient may ultimately benefit from an outpatient FDG PET-CT staging exam.   Electronically Signed   By: Suzy Bouchard M.D.   On: 01/04/2013 15:20   Mr Jeri Cos WN Contrast  01/13/2013   CLINICAL DATA:  68 year old male with intracranial masses diagnosed on 01/03/2013 following seizure activity and weakness. Subsequent lymph node biopsy demonstrating metastatic carcinoma, with features favoring adenocarcinoma of lung origin. Stereotactic radiosurgery planning study. Subsequent encounter.  EXAM: MRI HEAD WITHOUT AND WITH CONTRAST  TECHNIQUE: Multiplanar, multiecho pulse sequences of the brain and surrounding structures were obtained without and with intravenous contrast.  CONTRAST:  64mL MULTIHANCE GADOBENATE DIMEGLUMINE 529 MG/ML IV SOLN  COMPARISON:  Brain MRI 01/03/2013.  FINDINGS: Stable size of the inferior left cerebellar enhancing mass, 8 mm diameter. Mild associated edema is stable without significant mass effect.  Severe edema in the posterior left hemisphere re- identified in the region of a stable 13 mm enhancing left parietal lobe mass (series 10, image 122). A edema has mildly regressed, as has mass effect on the  left lateral ventricle, but considerable edema remains.  Near that mass is a small area of chronic micro hemorrhage, Stable and presumably unrelated. No enhancement (series 5, image 21 and series 10, image 121).  No additional enhancing brain mass identified.  No restricted diffusion or evidence of acute infarction. No ventriculomegaly. Basilar cisterns remain patent. Major intracranial vascular flow voids are preserved. No acute intracranial hemorrhage identified. Negative pituitary, cervicomedullary junction, and visualized cervical spine. Visualized bone marrow signal is within normal limits.  Visualized orbit soft tissues are within normal limits. Stable paranasal sinuses and mastoids. Visible internal auditory structures appear normal. Visualized scalp soft tissues are within normal limits.  IMPRESSION: 1. Two left brain small metastases are stable. Lesions annotated on series 10. Mildly decreased but considerable edema associated  with the left parietal lesion. No increased mass effect. 2. Presumably unrelated chronic micro hemorrhage in the left parietal lobe. No associated enhancement. 3. No new intracranial abnormality.   Electronically Signed   By: Lars Pinks M.D.   On: 01/13/2013 15:18   Mr Jeri Cos ER Contrast  01/03/2013   CLINICAL DATA:  Seizure activity. History of drug abuse. Right-sided weakness.  EXAM: MRI HEAD WITHOUT AND WITH CONTRAST  TECHNIQUE: Multiplanar, multiecho pulse sequences of the brain and surrounding structures were obtained without and with intravenous contrast.  CONTRAST:  76mL MULTIHANCE GADOBENATE DIMEGLUMINE 529 MG/ML IV SOLN  COMPARISON:  Head CT 01/02/2013  FINDINGS: There is no evidence of ischemic infarction. Within the left parietal lobe, there is an 11 x 13 mm enhancing focus with pronounced regional vasogenic edema. A 2nd enhancing focus is present within the inferior cerebellum on the left measuring 6 mm in diameter. There is minimal edema in that region. Elsewhere, there  are a few punctate foci of T2 and FLAIR signal in the hemispheric white matter not associated with abnormal enhancement and most likely indicative of old small vessel insults. There is a small focus of hemorrhage within the region of edema in the left parietal lobe. This is no larger than 3-4 mm. No hydrocephalus or extra-axial collection. Because of the edema, there is mass-effect on the posterior aspect of the left lateral ventricle. No midline shift. No pituitary mass. Sinuses are clear. No skull or skullbase lesion.  IMPRESSION: 11 x 13 mm enhancing lesion in the left parietal lobe with pronounced regional vasogenic edema. Second enhancing focus in the inferior cerebellum on the left measuring 6 mm in diameter with mild regional edema. The differential diagnosis is metastatic disease versus areas of focal cerebritis. Given the history of drug abuse, infectious cerebritis is certainly possible. The amount of vasogenic edema associated with the left parietal lesion is extreme, a finding often associated with acute cerebritis. .   Electronically Signed   By: Nelson Chimes M.D.   On: 01/03/2013 19:30   Ct Abdomen Pelvis W Contrast  01/04/2013   CLINICAL DATA:  Brain lesion on comparison MRI. Concern for metastatic disease.  EXAM: CT CHEST, ABDOMEN, AND PELVIS WITH CONTRAST  TECHNIQUE: Multidetector CT imaging of the chest, abdomen and pelvis was performed following the standard protocol during bolus administration of intravenous contrast.  CONTRAST:  174mL OMNIPAQUE IOHEXOL 300 MG/ML  SOLN  COMPARISON:  US SCROTUM dated 09/08/2007; MR HEAD WO/W CM dated 01/03/2013  FINDINGS: CT CHEST FINDINGS  There is little body fat in the patient's thorax . No axillary lymphadenopathy. Potential right supraclavicular lymph node measuring 16 mm (image number 1, series 5). There is enlarged right lower paratracheal lymph node measuring 15 mm short axis. Left lower paratracheal lymph node measures 12 mm. There is a large subcarinal  lymph node measuring 17 mm short axis. Prominent right hilar lymph node measures 10 mm (image 25, series 2).  Review of the lung parenchyma demonstrates a spiculated nodule at the superior aspect of the right upper lobe measuring 7 x 7 mm (image 11, is series 6). This is also seen on image number 2 of series 4. No additional suspicious pulmonary nodules. Central airways are normal.  CT ABDOMEN AND PELVIS FINDINGS  Low-density lesion the posterior right hepatic lobe has simple fluid attenuation consistent with benign cyst. 3 mm hypodense lesion in the central left hepatic lobe (image 52) is too small to characterize. The gallbladder, pancreas, spleen, adrenal glands,  and kidneys are normal.  Stomach, small bowel, and colon are unremarkable.  Abdominal aorta is normal caliber. No retroperitoneal periportal lymphadenopathy.  No free fluid the pelvis. The prostate gland and bladder normal. No pelvic lymphadenopathy. No aggressive osseous lesion.  IMPRESSION: 1. Mediastinal lymphadenopathy is concerning for metastatic adenopathy. 2. Small right upper lobe spiculated nodule may represent primary bronchogenic carcinoma. 3. Potential right supraclavicular enlarged lymph node. Ultrasound evaluation could delineate this lymph node. 4. Low-density lesions in the liver likely represent benign cysts. The smaller cyst is too small to characterize. 5. Patient may ultimately benefit from an outpatient FDG PET-CT staging exam.   Electronically Signed   By: Suzy Bouchard M.D.   On: 01/04/2013 15:20   Nm Pet Image Initial (pi) Skull Base To Thigh  01/26/2013   CLINICAL DATA:  Initial treatment strategy for lung cancer. Staging examination.  EXAM: NUCLEAR MEDICINE PET SKULL BASE TO THIGH  FASTING BLOOD GLUCOSE:  Value: 99mg /dl  TECHNIQUE: 20.3 mCi F-18 FDG was injected intravenously. CT data was obtained and used for attenuation correction and anatomic localization only. (This was not acquired as a diagnostic CT examination.)  Additional exam technical data entered on technologist worksheet.  COMPARISON:  None.  FINDINGS: NECK  No hypermetabolic lymph nodes in the neck.  CHEST  9 mm short axis right supraclavicular lymph node (SUVmax = 2.3). 1 cm short axis right subclavian lymph node (SUVmax = 3.3). Numerous borderline enlarged and enlarged mediastinal and right hilar lymph nodes demonstrate hypermetabolism (SUVmax = 2.7-9.9). Specific examples include a cluster of left paratracheal lymph nodes which together measure 3.4 x 1.8 cm (SUVmax = 9.9), a right lower paratracheal node measuring 1.9 x 3.5 cm (SUVmax = 5.7), and a right hilar node that cannot be measured on the noncontrast images (SUVmax = 4.7), as well as several others. Although there is a tiny 9 mm spiculated nodular opacity in the right apex, this is favored to represent a small focus of infectious or inflammatory scarring rather than neoplasm, as this does not demonstrate any evidence of hypermetabolism (however, this is below the resolution of PET imaging). No other definite suspicious appearing pulmonary nodules or masses are confidently identified on today's examination. No pleural effusion. No consolidative airspace disease. Heart size is mildly enlarged. Small amount of pericardial fluid and/or thickening, unlikely to be of hemodynamic significance at this time.  ABDOMEN/PELVIS  No hypermetabolic activity identified within the liver, pancreas, spleen or bilateral adrenal glands. Gallbladder is nearly completely contracted. 1.2 cm low-attenuation lesion in segment 7 of the liver is incompletely characterized on today's examination, but is likely a cyst based on comparison with prior study. The unenhanced appearance of the kidneys is unremarkable bilaterally. No definite hypermetabolic lymphadenopathy identified within the abdomen or pelvis. No significant volume of ascites. No pneumoperitoneum. No pathologic distention of small bowel.  SKELETON  No focal hypermetabolic  activity to suggest skeletal metastasis.  IMPRESSION: 1. Numerous enlarged and borderline enlarged lymph nodes with malignant range hypermetabolic activity within the right hilar region, mediastinum, right subclavian and right supraclavicular nodal stations, as above. 2. Although there is a spiculated 9 mm right upper lobe nodule in the apex, this is favored to represent an area of post infectious or inflammatory scarring. No definite suspicious appearing pulmonary nodules are confidently identified on today's examination. 3. No definite evidence of metastatic disease in the neck, abdomen or pelvis. 4. Additional incidental findings, as above.   Electronically Signed   By: Mauri Brooklyn.D.  On: 01/26/2013 16:21   US Biopsy  01/06/2013   CLINICAL DATA:  Abnormal right supraclavicular lymph node.  EXAM: ULTRASOUND GUIDED core BIOPSY OF a right supraclavicular lymph node  COMPLICATIONS: None.  FINDINGS: Images document needle placement in the right supraclavicular lymph node. Post biopsy images demonstrate no evidence of hemorrhage.  IMPRESSION: Successful right supraclavicular lymph node core biopsy.  : PROCEDURE:  The procedure, risks, benefits, and alternatives were explained to the patient. Questions regarding the procedure were encouraged and answered. The patient understands and consents to the procedure.  The right neck was prepped with Betadine in a sterile fashion, and a sterile drape was applied covering the operative field. A sterile gown and sterile gloves were used for the procedure. Local anesthesia was provided with 1% Lidocaine.  Under sonographic guidance, 3 18 gauge core biopsies of the enlarged right supraclavicular lymph node were obtained without complication. Post biopsy imaging was performed.   Electronically Signed   By: [name] M.D.   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 Patient is now seen in medical oncology for discussion of further treatment options. Clinically he seems to be pretty stable. My recommendation at this time is to proceed with a PET scan. He is also going to be seen by Dr. Kyung Rudd to begin [facility]. Certainly this will need to be done first. We discussed role of systemic therapy. He understands that this would be palliative chemotherapy. Certainly it could consist of Taxol and carboplatinum or Alimta and carboplatinum. We also discussed the possibility of the patient seeing Dr. Earlie Server since he is being lung cancer specialist. Patient's son was very interested in speaking with Dr. Earlie Server.  I have gone ahead and set the patient up for a PET scan. I've also sent messages to Dr. Earlie Server and his clinic navigator to get an appointment for the patient as soon as possible. Hopefully he can begin chemotherapy as soon as SRS is completed.   All questions were answered. The patient knows to call the clinic with any problems, questions or concerns. We can certainly see the patient much sooner if necessary.  I spent 40 minutes counseling the patient face to face. The total time spent in the appointment was 40 minutes.    Marcy Panning, MD Medical/Oncology [facility] [phone] (beeper) [phone] (Office)

## 2013-01-28 ENCOUNTER — Telehealth: Payer: Self-pay | Admitting: Internal Medicine

## 2013-01-28 ENCOUNTER — Encounter: Payer: Self-pay | Admitting: *Deleted

## 2013-01-28 ENCOUNTER — Other Ambulatory Visit: Payer: Medicare Other

## 2013-01-28 NOTE — Telephone Encounter (Signed)
talked to  pt appt for lab,md and chemom for february 2015

## 2013-01-29 ENCOUNTER — Other Ambulatory Visit: Payer: Medicare Other

## 2013-02-03 ENCOUNTER — Other Ambulatory Visit (HOSPITAL_BASED_OUTPATIENT_CLINIC_OR_DEPARTMENT_OTHER): Payer: Medicare Other

## 2013-02-03 ENCOUNTER — Ambulatory Visit (HOSPITAL_BASED_OUTPATIENT_CLINIC_OR_DEPARTMENT_OTHER): Payer: Medicare Other

## 2013-02-03 VITALS — BP 135/66 | HR 70 | Temp 97.2°F | Resp 19

## 2013-02-03 DIAGNOSIS — C3490 Malignant neoplasm of unspecified part of unspecified bronchus or lung: Secondary | ICD-10-CM

## 2013-02-03 DIAGNOSIS — C3410 Malignant neoplasm of upper lobe, unspecified bronchus or lung: Secondary | ICD-10-CM

## 2013-02-03 DIAGNOSIS — Z5111 Encounter for antineoplastic chemotherapy: Secondary | ICD-10-CM

## 2013-02-03 LAB — COMPREHENSIVE METABOLIC PANEL (CC13)
ALT: 45 U/L (ref 0–55)
ANION GAP: 10 meq/L (ref 3–11)
AST: 43 U/L — AB (ref 5–34)
Albumin: 3.3 g/dL — ABNORMAL LOW (ref 3.5–5.0)
Alkaline Phosphatase: 40 U/L (ref 40–150)
BUN: 27.6 mg/dL — ABNORMAL HIGH (ref 7.0–26.0)
CALCIUM: 8.9 mg/dL (ref 8.4–10.4)
CO2: 26 mEq/L (ref 22–29)
CREATININE: 1.4 mg/dL — AB (ref 0.7–1.3)
Chloride: 100 mEq/L (ref 98–109)
Glucose: 128 mg/dl (ref 70–140)
Potassium: 4.4 mEq/L (ref 3.5–5.1)
Sodium: 136 mEq/L (ref 136–145)
TOTAL PROTEIN: 6.3 g/dL — AB (ref 6.4–8.3)
Total Bilirubin: 0.24 mg/dL (ref 0.20–1.20)

## 2013-02-03 LAB — CBC WITH DIFFERENTIAL/PLATELET
BASO%: 0.1 % (ref 0.0–2.0)
Basophils Absolute: 0 10*3/uL (ref 0.0–0.1)
EOS ABS: 0 10*3/uL (ref 0.0–0.5)
EOS%: 0.1 % (ref 0.0–7.0)
HCT: 30.1 % — ABNORMAL LOW (ref 38.4–49.9)
HGB: 9.9 g/dL — ABNORMAL LOW (ref 13.0–17.1)
LYMPH#: 0.4 10*3/uL — AB (ref 0.9–3.3)
LYMPH%: 4.6 % — AB (ref 14.0–49.0)
MCH: 29.6 pg (ref 27.2–33.4)
MCHC: 32.9 g/dL (ref 32.0–36.0)
MCV: 89.9 fL (ref 79.3–98.0)
MONO#: 0.7 10*3/uL (ref 0.1–0.9)
MONO%: 7.6 % (ref 0.0–14.0)
NEUT%: 87.6 % — ABNORMAL HIGH (ref 39.0–75.0)
NEUTROS ABS: 8.4 10*3/uL — AB (ref 1.5–6.5)
NRBC: 0 % (ref 0–0)
Platelets: 171 10*3/uL (ref 140–400)
RBC: 3.35 10*6/uL — AB (ref 4.20–5.82)
RDW: 15.9 % — AB (ref 11.0–14.6)
WBC: 9.6 10*3/uL (ref 4.0–10.3)

## 2013-02-03 MED ORDER — SODIUM CHLORIDE 0.9 % IV SOLN
Freq: Once | INTRAVENOUS | Status: AC
  Administered 2013-02-03: 13:00:00 via INTRAVENOUS

## 2013-02-03 MED ORDER — ONDANSETRON 16 MG/50ML IVPB (CHCC)
INTRAVENOUS | Status: AC
Start: 2013-02-03 — End: 2013-02-03
  Filled 2013-02-03: qty 16

## 2013-02-03 MED ORDER — ONDANSETRON 16 MG/50ML IVPB (CHCC)
16.0000 mg | Freq: Once | INTRAVENOUS | Status: AC
  Administered 2013-02-03: 16 mg via INTRAVENOUS

## 2013-02-03 MED ORDER — DEXAMETHASONE SODIUM PHOSPHATE 20 MG/5ML IJ SOLN
20.0000 mg | Freq: Once | INTRAMUSCULAR | Status: AC
  Administered 2013-02-03: 20 mg via INTRAVENOUS

## 2013-02-03 MED ORDER — SODIUM CHLORIDE 0.9 % IV SOLN
395.0000 mg | Freq: Once | INTRAVENOUS | Status: AC
  Administered 2013-02-03: 400 mg via INTRAVENOUS
  Filled 2013-02-03: qty 40

## 2013-02-03 MED ORDER — SODIUM CHLORIDE 0.9 % IV SOLN
500.0000 mg/m2 | Freq: Once | INTRAVENOUS | Status: AC
  Administered 2013-02-03: 1000 mg via INTRAVENOUS
  Filled 2013-02-03: qty 40

## 2013-02-03 MED ORDER — DEXAMETHASONE SODIUM PHOSPHATE 20 MG/5ML IJ SOLN
INTRAMUSCULAR | Status: AC
  Filled 2013-02-03: qty 5

## 2013-02-03 NOTE — Patient Instructions (Signed)
[facility] Discharge Instructions for Patients Receiving Chemotherapy  Today you received the following chemotherapy agents: Alimta, Carboplatin  To help prevent nausea and vomiting after your treatment, we encourage you to take your nausea medication: Compazine 10 mg every 6 hrs as needed.   If you develop nausea and vomiting that is not controlled by your nausea medication, call the clinic.  TAKE YOUR NAUSEA MEDICATION TONIGHT AT BEDTIME REGARDLESS IF YOU ARE NAUSEATED OR NOT.  BELOW ARE SYMPTOMS THAT SHOULD BE REPORTED IMMEDIATELY:  *FEVER GREATER THAN 100.5 F  *CHILLS WITH OR WITHOUT FEVER  NAUSEA AND VOMITING THAT IS NOT CONTROLLED WITH YOUR NAUSEA MEDICATION  *UNUSUAL SHORTNESS OF BREATH  *UNUSUAL BRUISING OR BLEEDING  TENDERNESS IN MOUTH AND THROAT WITH OR WITHOUT PRESENCE OF ULCERS  *URINARY PROBLEMS  *BOWEL PROBLEMS  UNUSUAL RASH Items with * indicate a potential emergency and should be followed up as soon as possible.  Feel free to call the clinic you have any questions or concerns. The clinic phone number is [phone].  Pemetrexed injection (Alimta) What is this medicine? PEMETREXED (PEM e TREX ed) is a chemotherapy drug. This medicine affects cells that are rapidly growing, such as cancer cells and cells in your mouth and stomach. It is usually used to treat lung cancers like non-small cell lung cancer and mesothelioma. It may also be used to treat other cancers. This medicine may be used for other purposes; ask your health care provider or pharmacist if you have questions. COMMON BRAND NAME(S): Alimta What should I tell my health care provider before I take this medicine? They need to know if you have any of these conditions: -if you frequently drink alcohol containing beverages -infection (especially a virus infection such as chickenpox, cold sores, or herpes) -kidney disease -liver disease -low blood counts, like low platelets, red bloods,  or white blood cells -an unusual or allergic reaction to pemetrexed, mannitol, other medicines, foods, dyes, or preservatives -pregnant or trying to get pregnant -breast-feeding How should I use this medicine? This drug is given as an infusion into a vein. It is administered in a hospital or clinic by a specially trained health care professional. Talk to your pediatrician regarding the use of this medicine in children. Special care may be needed. Overdosage: If you think you have taken too much of this medicine contact a poison control center or emergency room at once. NOTE: This medicine is only for you. Do not share this medicine with others. What if I miss a dose? It is important not to miss your dose. Call your doctor or health care professional if you are unable to keep an appointment. What may interact with this medicine? -aspirin and aspirin-like medicines -medicines to increase blood counts like filgrastim, pegfilgrastim, sargramostim -methotrexate -NSAIDS, medicines for pain and inflammation, like ibuprofen or naproxen -probenecid -pyrimethamine -vaccines Talk to your doctor or health care professional before taking any of these medicines: -acetaminophen -aspirin -ibuprofen -ketoprofen -naproxen This list may not describe all possible interactions. Give your health care provider a list of all the medicines, herbs, non-prescription drugs, or dietary supplements you use. Also tell them if you smoke, drink alcohol, or use illegal drugs. Some items may interact with your medicine. What should I watch for while using this medicine? Visit your doctor for checks on your progress. This drug may make you feel generally unwell. This is not uncommon, as chemotherapy can affect healthy cells as well as cancer cells. Report any side effects. Continue  your course of treatment even though you feel ill unless your doctor tells you to stop. In some cases, you may be given additional medicines to  help with side effects. Follow all directions for their use. Call your doctor or health care professional for advice if you get a fever, chills or sore throat, or other symptoms of a cold or flu. Do not treat yourself. This drug decreases your body's ability to fight infections. Try to avoid being around people who are sick. This medicine may increase your risk to bruise or bleed. Call your doctor or health care professional if you notice any unusual bleeding. Be careful brushing and flossing your teeth or using a toothpick because you may get an infection or bleed more easily. If you have any dental work done, tell your dentist you are receiving this medicine. Avoid taking products that contain aspirin, acetaminophen, ibuprofen, naproxen, or ketoprofen unless instructed by your doctor. These medicines may hide a fever. Call your doctor or health care professional if you get diarrhea or mouth sores. Do not treat yourself. To protect your kidneys, drink water or other fluids as directed while you are taking this medicine. Men and women must use effective birth control while taking this medicine. You may also need to continue using effective birth control for a time after stopping this medicine. Do not become pregnant while taking this medicine. Tell your doctor right away if you think that you or your partner might be pregnant. There is a potential for serious side effects to an unborn child. Talk to your health care professional or pharmacist for more information. Do not breast-feed an infant while taking this medicine. This medicine may lower sperm counts. What side effects may I notice from receiving this medicine? Side effects that you should report to your doctor or health care professional as soon as possible: -allergic reactions like skin rash, itching or hives, swelling of the face, lips, or tongue -low blood counts - this medicine may decrease the number of white blood cells, red blood cells and  platelets. You may be at increased risk for infections and bleeding. -signs of infection - fever or chills, cough, sore throat, pain or difficulty passing urine -signs of decreased platelets or bleeding - bruising, pinpoint red spots on the skin, black, tarry stools, blood in the urine -signs of decreased red blood cells - unusually weak or tired, fainting spells, lightheadedness -breathing problems, like a dry cough -changes in emotions or moods -chest pain -confusion -diarrhea -high blood pressure -mouth or throat sores or ulcers -pain, swelling, warmth in the leg -pain on swallowing -swelling of the ankles, feet, hands -trouble passing urine or change in the amount of urine -vomiting -yellowing of the eyes or skin Side effects that usually do not require medical attention (report to your doctor or health care professional if they continue or are bothersome): -hair loss -loss of appetite -nausea -stomach upset This list may not describe all possible side effects. Call your doctor for medical advice about side effects. You may report side effects to FDA at 1-800-FDA-1088. Where should I keep my medicine? This drug is given in a hospital or clinic and will not be stored at home. NOTE: This sheet is a summary. It may not cover all possible information. If you have questions about this medicine, talk to your doctor, pharmacist, or health care provider.  2014, [employer]. (2007-07-22 13:24:03)  Carboplatin injection (Carboplatin) What is this medicine? CARBOPLATIN (KAR boe pla tin) is a  chemotherapy drug. It targets fast dividing cells, like cancer cells, and causes these cells to die. This medicine is used to treat ovarian cancer and many other cancers. This medicine may be used for other purposes; ask your health care provider or pharmacist if you have questions. COMMON BRAND NAME(S): Paraplatin What should I tell my health care provider before I take this medicine? They  need to know if you have any of these conditions: -blood disorders -hearing problems -kidney disease -recent or ongoing radiation therapy -an unusual or allergic reaction to carboplatin, cisplatin, other chemotherapy, other medicines, foods, dyes, or preservatives -pregnant or trying to get pregnant -breast-feeding How should I use this medicine? This drug is usually given as an infusion into a vein. It is administered in a hospital or clinic by a specially trained health care professional. Talk to your pediatrician regarding the use of this medicine in children. Special care may be needed. Overdosage: If you think you have taken too much of this medicine contact a poison control center or emergency room at once. NOTE: This medicine is only for you. Do not share this medicine with others. What if I miss a dose? It is important not to miss a dose. Call your doctor or health care professional if you are unable to keep an appointment. What may interact with this medicine? -medicines for seizures -medicines to increase blood counts like filgrastim, pegfilgrastim, sargramostim -some antibiotics like amikacin, gentamicin, neomycin, streptomycin, tobramycin -vaccines Talk to your doctor or health care professional before taking any of these medicines: -acetaminophen -aspirin -ibuprofen -ketoprofen -naproxen This list may not describe all possible interactions. Give your health care provider a list of all the medicines, herbs, non-prescription drugs, or dietary supplements you use. Also tell them if you smoke, drink alcohol, or use illegal drugs. Some items may interact with your medicine. What should I watch for while using this medicine? Your condition will be monitored carefully while you are receiving this medicine. You will need important blood work done while you are taking this medicine. This drug may make you feel generally unwell. This is not uncommon, as chemotherapy can affect healthy  cells as well as cancer cells. Report any side effects. Continue your course of treatment even though you feel ill unless your doctor tells you to stop. In some cases, you may be given additional medicines to help with side effects. Follow all directions for their use. Call your doctor or health care professional for advice if you get a fever, chills or sore throat, or other symptoms of a cold or flu. Do not treat yourself. This drug decreases your body's ability to fight infections. Try to avoid being around people who are sick. This medicine may increase your risk to bruise or bleed. Call your doctor or health care professional if you notice any unusual bleeding. Be careful brushing and flossing your teeth or using a toothpick because you may get an infection or bleed more easily. If you have any dental work done, tell your dentist you are receiving this medicine. Avoid taking products that contain aspirin, acetaminophen, ibuprofen, naproxen, or ketoprofen unless instructed by your doctor. These medicines may hide a fever. Do not become pregnant while taking this medicine. Women should inform their doctor if they wish to become pregnant or think they might be pregnant. There is a potential for serious side effects to an unborn child. Talk to your health care professional or pharmacist for more information. Do not breast-feed an infant while taking this  medicine. What side effects may I notice from receiving this medicine? Side effects that you should report to your doctor or health care professional as soon as possible: -allergic reactions like skin rash, itching or hives, swelling of the face, lips, or tongue -signs of infection - fever or chills, cough, sore throat, pain or difficulty passing urine -signs of decreased platelets or bleeding - bruising, pinpoint red spots on the skin, black, tarry stools, nosebleeds -signs of decreased red blood cells - unusually weak or tired, fainting spells,  lightheadedness -breathing problems -changes in hearing -changes in vision -chest pain -high blood pressure -low blood counts - This drug may decrease the number of white blood cells, red blood cells and platelets. You may be at increased risk for infections and bleeding. -nausea and vomiting -pain, swelling, redness or irritation at the injection site -pain, tingling, numbness in the hands or feet -problems with balance, talking, walking -trouble passing urine or change in the amount of urine Side effects that usually do not require medical attention (report to your doctor or health care professional if they continue or are bothersome): -hair loss -loss of appetite -metallic taste in the mouth or changes in taste This list may not describe all possible side effects. Call your doctor for medical advice about side effects. You may report side effects to FDA at 1-800-FDA-1088. Where should I keep my medicine? This drug is given in a hospital or clinic and will not be stored at home. NOTE: This sheet is a summary. It may not cover all possible information. If you have questions about this medicine, talk to your doctor, pharmacist, or health care provider.  2014, [employer]. (2007-03-25 14:38:05)

## 2013-02-04 ENCOUNTER — Telehealth: Payer: Self-pay | Admitting: *Deleted

## 2013-02-04 NOTE — Telephone Encounter (Signed)
Message copied by Tania Ade on Wed Feb 04, 2013 11:11 AM ------      Message from: Adalberto Cole      Created: Tue Feb 03, 2013  2:40 PM      Regarding: new chemo      Contact: [phone]       1st tx, Alimta and Carboplatin. Please follow up to ensure patient obtained nausea medication. ------

## 2013-02-04 NOTE — Telephone Encounter (Signed)
Attempted to call patient to follow up on status-no answer/no machine.

## 2013-02-04 NOTE — Telephone Encounter (Signed)
No adverse effect from chemo treatment

## 2013-02-10 ENCOUNTER — Telehealth: Payer: Self-pay | Admitting: *Deleted

## 2013-02-10 ENCOUNTER — Encounter: Payer: Self-pay | Admitting: *Deleted

## 2013-02-10 ENCOUNTER — Encounter: Payer: Self-pay | Admitting: Physician Assistant

## 2013-02-10 ENCOUNTER — Telehealth: Payer: Self-pay | Admitting: Internal Medicine

## 2013-02-10 ENCOUNTER — Other Ambulatory Visit (HOSPITAL_BASED_OUTPATIENT_CLINIC_OR_DEPARTMENT_OTHER): Payer: Medicare Other

## 2013-02-10 ENCOUNTER — Ambulatory Visit (HOSPITAL_BASED_OUTPATIENT_CLINIC_OR_DEPARTMENT_OTHER): Payer: Medicare Other | Admitting: Physician Assistant

## 2013-02-10 VITALS — BP 139/68 | HR 79 | Temp 96.5°F | Resp 19 | Ht 76.0 in | Wt 162.1 lb

## 2013-02-10 DIAGNOSIS — C3490 Malignant neoplasm of unspecified part of unspecified bronchus or lung: Secondary | ICD-10-CM

## 2013-02-10 DIAGNOSIS — C7931 Secondary malignant neoplasm of brain: Secondary | ICD-10-CM

## 2013-02-10 DIAGNOSIS — C3410 Malignant neoplasm of upper lobe, unspecified bronchus or lung: Secondary | ICD-10-CM

## 2013-02-10 DIAGNOSIS — C7949 Secondary malignant neoplasm of other parts of nervous system: Secondary | ICD-10-CM

## 2013-02-10 DIAGNOSIS — C770 Secondary and unspecified malignant neoplasm of lymph nodes of head, face and neck: Secondary | ICD-10-CM

## 2013-02-10 LAB — CBC WITH DIFFERENTIAL/PLATELET
BASO%: 0 % (ref 0.0–2.0)
Basophils Absolute: 0 10*3/uL (ref 0.0–0.1)
EOS%: 0 % (ref 0.0–7.0)
Eosinophils Absolute: 0 10*3/uL (ref 0.0–0.5)
HEMATOCRIT: 25.7 % — AB (ref 38.4–49.9)
HEMOGLOBIN: 8.2 g/dL — AB (ref 13.0–17.1)
LYMPH%: 6 % — ABNORMAL LOW (ref 14.0–49.0)
MCH: 29 pg (ref 27.2–33.4)
MCHC: 31.9 g/dL — AB (ref 32.0–36.0)
MCV: 90.8 fL (ref 79.3–98.0)
MONO#: 0.2 10*3/uL (ref 0.1–0.9)
MONO%: 4.2 % (ref 0.0–14.0)
NEUT#: 3.4 10*3/uL (ref 1.5–6.5)
NEUT%: 89.8 % — AB (ref 39.0–75.0)
Platelets: 106 10*3/uL — ABNORMAL LOW (ref 140–400)
RBC: 2.83 10*6/uL — ABNORMAL LOW (ref 4.20–5.82)
RDW: 15.6 % — ABNORMAL HIGH (ref 11.0–14.6)
WBC: 3.8 10*3/uL — ABNORMAL LOW (ref 4.0–10.3)
lymph#: 0.2 10*3/uL — ABNORMAL LOW (ref 0.9–3.3)

## 2013-02-10 LAB — COMPREHENSIVE METABOLIC PANEL (CC13)
ALT: 44 U/L (ref 0–55)
AST: 25 U/L (ref 5–34)
Albumin: 3.1 g/dL — ABNORMAL LOW (ref 3.5–5.0)
Alkaline Phosphatase: 49 U/L (ref 40–150)
Anion Gap: 9 mEq/L (ref 3–11)
BUN: 30.9 mg/dL — AB (ref 7.0–26.0)
CO2: 26 mEq/L (ref 22–29)
CREATININE: 1.5 mg/dL — AB (ref 0.7–1.3)
Calcium: 9 mg/dL (ref 8.4–10.4)
Chloride: 106 mEq/L (ref 98–109)
Glucose: 123 mg/dl (ref 70–140)
Potassium: 4.4 mEq/L (ref 3.5–5.1)
Sodium: 141 mEq/L (ref 136–145)
Total Protein: 6 g/dL — ABNORMAL LOW (ref 6.4–8.3)

## 2013-02-10 NOTE — Progress Notes (Addendum)
[place] [phone]   Fax:([phone]) [place] NOTE  [name], MD [place] [id number]  DIAGNOSIS: Stage IV (T1a, N2, M1b) non-small cell lung cancer, adenocarcinoma with brain metastasis diagnosed in January of 2015.  PRIOR THERAPY:status post stereotactic radiotherapy to the left parietal and left cerebellar lesions under the care of Dr. Lisbeth Renshaw completed 01/19/2013.  CURRENT THERAPY: Systemic chemotherapy with carboplatin for AUC of 5 and Alimta 500 mg/M2 given every 3 weeks. First cycle on 02/03/2013. Status post 1 cycle.  INTERVAL HISTORY: Carlos Beasley 68 y.o. male returns to the clinic today for a symptom management visit after completing his first cycle of systemic chemotherapy with carboplatin and Alimta.overall he tolerated his first cycle of chemotherapy without difficulty. He does report continued shortness breath with exertion not associated with cough. He denied any fever or chills. He is having issues with constipation that started a few days after chemotherapy. He is, altered something that is diet and is drinking more apple juice with mixed results. His last normal bowel movement was on 02/08/2013. He also reports it is not sleeping well. He was prescribed Ambien 10 mg at bedtime. He reports that 1 tablet did not help so he took upon himself to take 2 at the time. He was able to sleep well at this dose. It was explained to the patient that this was not the way the medication was prescribed and it was dangerous for him to increase the dose of medication without being directed to by the prescriber.He denied having any significant chest pain but continues to have shortness breath with exertion with no hemoptysis. He denied having any significant weight loss or night sweats.    MEDICAL HISTORY: Past Medical History  Diagnosis Date  . Medical history non-contributory   . Hypertension     . Lung cancer     ALLERGIES:  has No Known Allergies.  MEDICATIONS:  Current Outpatient Prescriptions  Medication Sig Dispense Refill  . amLODipine (NORVASC) 10 MG tablet Take 1 tablet (10 mg total) by mouth daily.  30 tablet  1  . aspirin EC 325 MG EC tablet Take 1 tablet (325 mg total) by mouth daily.  30 tablet  0  . dexamethasone (DECADRON) 4 MG tablet Take 1 tablet (4 mg total) by mouth every 6 (six) hours.  120 tablet  1  . dexamethasone (DECADRON) 4 MG tablet 4 mg by mouth twice a day the day before, day of and day after the chemotherapy every 3 weeks  40 tablet  1  . folic acid (FOLVITE) 1 MG tablet Take 1 tablet (1 mg total) by mouth daily.  30 tablet  3  . hydrALAZINE (APRESOLINE) 25 MG tablet Take 1 tablet (25 mg total) by mouth every 8 (eight) hours.  90 tablet  1  . levETIRAcetam (KEPPRA) 1000 MG tablet Take 1 tablet (1,000 mg total) by mouth 2 (two) times daily.  60 tablet  1  . lisinopril (PRINIVIL,ZESTRIL) 5 MG tablet Take 1 tablet (5 mg total) by mouth daily.  30 tablet  1  . pantoprazole (PROTONIX) 40 MG tablet Take 1 tablet (40 mg total) by mouth daily.  30 tablet  1  . prochlorperazine (COMPAZINE) 10 MG tablet Take 1 tablet (10 mg total) by mouth every 6 (six) hours as needed for nausea or vomiting.  60 tablet  0  . zolpidem (AMBIEN) 10 MG tablet Take 10  mg by mouth at bedtime as needed for sleep. With 1 refill called rx to [facility] [phone]       No current facility-administered medications for this visit.    SURGICAL HISTORY:  Past Surgical History  Procedure Laterality Date  . No past surgeries      REVIEW OF SYSTEMS:  Constitutional: positive for fatigue Eyes: negative Ears, nose, mouth, throat, and face: negative Respiratory: positive for dyspnea on exertion Cardiovascular: negative Gastrointestinal: positive for constipation Genitourinary:negative Integument/breast: negative Hematologic/lymphatic: negative Musculoskeletal:negative Neurological:  negative Behavioral/Psych: negative Endocrine: negative Allergic/Immunologic: negative   PHYSICAL EXAMINATION: General appearance: alert, cooperative, fatigued and no distress Head: Normocephalic, without obvious abnormality, atraumatic Neck: no adenopathy, no JVD, supple, symmetrical, trachea midline and thyroid not enlarged, symmetric, no tenderness/mass/nodules Lymph nodes: Cervical, supraclavicular, and axillary nodes normal. Resp: clear to auscultation bilaterally Back: symmetric, no curvature. ROM normal. No CVA tenderness. Cardio: regular rate and rhythm, S1, S2 normal, no murmur, click, rub or gallop GI: soft, non-tender; bowel sounds normal; no masses,  no organomegaly Extremities: extremities normal, atraumatic, no cyanosis or edema Neurologic: Alert and oriented X 3, normal strength and tone. Normal symmetric reflexes. Normal coordination and gait  ECOG PERFORMANCE STATUS: 1 - Symptomatic but completely ambulatory  Blood pressure 139/68, pulse 79, temperature 96.5 F (35.8 C), temperature source Oral, resp. rate 19, height _0  (1.93 m), weight 162 lb 1.6 oz (73.528 kg).  LABORATORY DATA: Lab Results  Component Value Date   WBC 3.8* 02/10/2013   HGB 8.2* 02/10/2013   HCT 25.7* 02/10/2013   MCV 90.8 02/10/2013   PLT 106* 02/10/2013      Chemistry      Component Value Date/Time   NA 141 02/10/2013 0947   NA 136* 01/08/2013 1300   K 4.4 02/10/2013 0947   K 4.3 01/08/2013 1300   CL 101 01/08/2013 1300   CO2 26 02/10/2013 0947   CO2 23 01/08/2013 1300   BUN 30.9* 02/10/2013 0947   BUN 15 01/08/2013 1300   CREATININE 1.5* 02/10/2013 0947   CREATININE 1.03 01/08/2013 1300      Component Value Date/Time   CALCIUM 9.0 02/10/2013 0947   CALCIUM 8.5 01/08/2013 1300   ALKPHOS 49 02/10/2013 0947   ALKPHOS 54 01/08/2013 1300   AST 25 02/10/2013 0947   AST 14 01/08/2013 1300   ALT 44 02/10/2013 0947   ALT 20 01/08/2013 1300   BILITOT <0.20 02/10/2013 0947   BILITOT <0.2* 01/08/2013 1300        RADIOGRAPHIC STUDIES: Ct Head Wo Contrast  01/02/2013   CLINICAL DATA:  Right-sided weakness.  EXAM: CT HEAD WITHOUT CONTRAST  TECHNIQUE: Contiguous axial images were obtained from the base of the skull through the vertex without intravenous contrast.  COMPARISON:  None.  FINDINGS: Sinuses/Soft tissues: Clear paranasal sinuses and mastoid air cells.  Intracranial: Large area of left parietal hypoattenuation and mass effect. This causes partial effacement of the occipital horn of the left lateral ventricle. 1-2 mm of left-to-right midline shift.  No complicating hemorrhage.  Basal cisterns patent.  Apparent hypoattenuation in the left cerebellar hemisphere on image 6/series 2 is favored to be a artifactual.  No hydrocephalus, intra-axial, or extra-axial fluid collection.  IMPRESSION: 1. Large area of edema within the left parietal lobe. Given the clinical history, this most likely represents a subacute infarct. Given the appearance, differential consideration includes a mass lesion such as a metastasis. There is mass effect and minimal midline shift. Critical test results telephoned to.Dr. Regenia Skeeter.  at the time of interpretation at 2:30 p.m.on 01/02/2013. 2. Probable artifactual hypoattenuation in the left cerebellar hemisphere. If the patient undergoes MRI, recommend attention to this area.   Electronically Signed   By: Abigail Miyamoto M.D.   On: 01/02/2013 14:31   Ct Chest W Contrast  01/04/2013   CLINICAL DATA:  Brain lesion on comparison MRI. Concern for metastatic disease.  EXAM: CT CHEST, ABDOMEN, AND PELVIS WITH CONTRAST  TECHNIQUE: Multidetector CT imaging of the chest, abdomen and pelvis was performed following the standard protocol during bolus administration of intravenous contrast.  CONTRAST:  19m OMNIPAQUE IOHEXOL 300 MG/ML  SOLN  COMPARISON:  [place]; MR HEAD WO/W CM dated 01/03/2013  FINDINGS: CT CHEST FINDINGS  There is little body fat in the patient's thorax . No axillary  lymphadenopathy. Potential right supraclavicular lymph node measuring 16 mm (image number 1, series 5). There is enlarged right lower paratracheal lymph node measuring 15 mm short axis. Left lower paratracheal lymph node measures 12 mm. There is a large subcarinal lymph node measuring 17 mm short axis. Prominent right hilar lymph node measures 10 mm (image 25, series 2).  Review of the lung parenchyma demonstrates a spiculated nodule at the superior aspect of the right upper lobe measuring 7 x 7 mm (image 11, is series 6). This is also seen on image number 2 of series 4. No additional suspicious pulmonary nodules. Central airways are normal.  CT ABDOMEN AND PELVIS FINDINGS  Low-density lesion the posterior right hepatic lobe has simple fluid attenuation consistent with benign cyst. 3 mm hypodense lesion in the central left hepatic lobe (image 52) is too small to characterize. The gallbladder, pancreas, spleen, adrenal glands, and kidneys are normal.  Stomach, small bowel, and colon are unremarkable.  Abdominal aorta is normal caliber. No retroperitoneal periportal lymphadenopathy.  No free fluid the pelvis. The prostate gland and bladder normal. No pelvic lymphadenopathy. No aggressive osseous lesion.  IMPRESSION: 1. Mediastinal lymphadenopathy is concerning for metastatic adenopathy. 2. Small right upper lobe spiculated nodule may represent primary bronchogenic carcinoma. 3. Potential right supraclavicular enlarged lymph node. Ultrasound evaluation could delineate this lymph node. 4. Low-density lesions in the liver likely represent benign cysts. The smaller cyst is too small to characterize. 5. Patient may ultimately benefit from an outpatient FDG PET-CT staging exam.   Electronically Signed   By: SSuzy BouchardM.D.   On: 01/04/2013 15:20   Mr BJeri CosWNLContrast  01/13/2013   CLINICAL DATA:  68year old male with intracranial masses diagnosed on 01/03/2013 following seizure activity and weakness. Subsequent  lymph node biopsy demonstrating metastatic carcinoma, with features favoring adenocarcinoma of lung origin. Stereotactic radiosurgery planning study. Subsequent encounter.  EXAM: MRI HEAD WITHOUT AND WITH CONTRAST  TECHNIQUE: Multiplanar, multiecho pulse sequences of the brain and surrounding structures were obtained without and with intravenous contrast.  CONTRAST:  183mMULTIHANCE GADOBENATE DIMEGLUMINE 529 MG/ML IV SOLN  COMPARISON:  Brain MRI 01/03/2013.  FINDINGS: Stable size of the inferior left cerebellar enhancing mass, 8 mm diameter. Mild associated edema is stable without significant mass effect.  Severe edema in the posterior left hemisphere re- identified in the region of a stable 13 mm enhancing left parietal lobe mass (series 10, image 122). A edema has mildly regressed, as has mass effect on the left lateral ventricle, but considerable edema remains.  Near that mass is a small area of chronic micro hemorrhage, Stable and presumably unrelated. No enhancement (series 5, image 21 and series  10, image 121).  No additional enhancing brain mass identified.  No restricted diffusion or evidence of acute infarction. No ventriculomegaly. Basilar cisterns remain patent. Major intracranial vascular flow voids are preserved. No acute intracranial hemorrhage identified. Negative pituitary, cervicomedullary junction, and visualized cervical spine. Visualized bone marrow signal is within normal limits.  Visualized orbit soft tissues are within normal limits. Stable paranasal sinuses and mastoids. Visible internal auditory structures appear normal. Visualized scalp soft tissues are within normal limits.  IMPRESSION: 1. Two left brain small metastases are stable. Lesions annotated on series 10. Mildly decreased but considerable edema associated with the left parietal lesion. No increased mass effect. 2. Presumably unrelated chronic micro hemorrhage in the left parietal lobe. No associated enhancement. 3. No new  intracranial abnormality.   Electronically Signed   By: Lars Pinks M.D.   On: 01/13/2013 15:18   Mr Jeri Cos PF Contrast  01/03/2013   CLINICAL DATA:  Seizure activity. History of drug abuse. Right-sided weakness.  EXAM: MRI HEAD WITHOUT AND WITH CONTRAST  TECHNIQUE: Multiplanar, multiecho pulse sequences of the brain and surrounding structures were obtained without and with intravenous contrast.  CONTRAST:  20m MULTIHANCE GADOBENATE DIMEGLUMINE 529 MG/ML IV SOLN  COMPARISON:  Head CT 01/02/2013  FINDINGS: There is no evidence of ischemic infarction. Within the left parietal lobe, there is an 11 x 13 mm enhancing focus with pronounced regional vasogenic edema. A 2nd enhancing focus is present within the inferior cerebellum on the left measuring 6 mm in diameter. There is minimal edema in that region. Elsewhere, there are a few punctate foci of T2 and FLAIR signal in the hemispheric white matter not associated with abnormal enhancement and most likely indicative of old small vessel insults. There is a small focus of hemorrhage within the region of edema in the left parietal lobe. This is no larger than 3-4 mm. No hydrocephalus or extra-axial collection. Because of the edema, there is mass-effect on the posterior aspect of the left lateral ventricle. No midline shift. No pituitary mass. Sinuses are clear. No skull or skullbase lesion.  IMPRESSION: 11 x 13 mm enhancing lesion in the left parietal lobe with pronounced regional vasogenic edema. Second enhancing focus in the inferior cerebellum on the left measuring 6 mm in diameter with mild regional edema. The differential diagnosis is metastatic disease versus areas of focal cerebritis. Given the history of drug abuse, infectious cerebritis is certainly possible. The amount of vasogenic edema associated with the left parietal lesion is extreme, a finding often associated with acute cerebritis. .   Electronically Signed   By: MNelson ChimesM.D.   On: 01/03/2013 19:30    Ct Abdomen Pelvis W Contrast  01/04/2013   CLINICAL DATA:  Brain lesion on comparison MRI. Concern for metastatic disease.  EXAM: CT CHEST, ABDOMEN, AND PELVIS WITH CONTRAST  TECHNIQUE: Multidetector CT imaging of the chest, abdomen and pelvis was performed following the standard protocol during bolus administration of intravenous contrast.  CONTRAST:  1026mOMNIPAQUE IOHEXOL 300 MG/ML  SOLN  COMPARISON:  [place]; MR HEAD WO/W CM dated 01/03/2013  FINDINGS: CT CHEST FINDINGS  There is little body fat in the patient's thorax . No axillary lymphadenopathy. Potential right supraclavicular lymph node measuring 16 mm (image number 1, series 5). There is enlarged right lower paratracheal lymph node measuring 15 mm short axis. Left lower paratracheal lymph node measures 12 mm. There is a large subcarinal lymph node measuring 17 mm short axis. Prominent right hilar lymph  node measures 10 mm (image 25, series 2).  Review of the lung parenchyma demonstrates a spiculated nodule at the superior aspect of the right upper lobe measuring 7 x 7 mm (image 11, is series 6). This is also seen on image number 2 of series 4. No additional suspicious pulmonary nodules. Central airways are normal.  CT ABDOMEN AND PELVIS FINDINGS  Low-density lesion the posterior right hepatic lobe has simple fluid attenuation consistent with benign cyst. 3 mm hypodense lesion in the central left hepatic lobe (image 52) is too small to characterize. The gallbladder, pancreas, spleen, adrenal glands, and kidneys are normal.  Stomach, small bowel, and colon are unremarkable.  Abdominal aorta is normal caliber. No retroperitoneal periportal lymphadenopathy.  No free fluid the pelvis. The prostate gland and bladder normal. No pelvic lymphadenopathy. No aggressive osseous lesion.  IMPRESSION: 1. Mediastinal lymphadenopathy is concerning for metastatic adenopathy. 2. Small right upper lobe spiculated nodule may represent primary bronchogenic  carcinoma. 3. Potential right supraclavicular enlarged lymph node. Ultrasound evaluation could delineate this lymph node. 4. Low-density lesions in the liver likely represent benign cysts. The smaller cyst is too small to characterize. 5. Patient may ultimately benefit from an outpatient FDG PET-CT staging exam.   Electronically Signed   By: Suzy Bouchard M.D.   On: 01/04/2013 15:20   US Biopsy  01/06/2013   CLINICAL DATA:  Abnormal right supraclavicular lymph node.  EXAM: ULTRASOUND GUIDED core BIOPSY OF a right supraclavicular lymph node  COMPLICATIONS: None.  FINDINGS: Images document needle placement in the right supraclavicular lymph node. Post biopsy images demonstrate no evidence of hemorrhage.  IMPRESSION: Successful right supraclavicular lymph node core biopsy.  : PROCEDURE:  The procedure, risks, benefits, and alternatives were explained to the patient. Questions regarding the procedure were encouraged and answered. The patient understands and consents to the procedure.  The right neck was prepped with Betadine in a sterile fashion, and a sterile drape was applied covering the operative field. A sterile gown and sterile gloves were used for the procedure. Local anesthesia was provided with 1% Lidocaine.  Under sonographic guidance, 3 18 gauge core biopsies of the enlarged right supraclavicular lymph node were obtained without complication. Post biopsy imaging was performed.   Electronically Signed   By: [name] M.D.   On: 01/06/2013 15:04    ASSESSMENT AND PLAN: This is a very pleasant 68 years old [place] American male recently diagnosed with metastatic non-small cell lung cancer, adenocarcinoma with negative EGFR mutation and negative ALK gene translocation. The patient had brain metastasis treated with stereotactic radiotherapy. He is currently being treated with systemic chemotherapy with carboplatin for AUC of 5 and Alimta 500 mg/M2 every 3 weeks, status post 1 cycle. Overall he tolerated  his first cycle of chemotherapy without difficulty. Regardless constipation patient's last increase his fluid intake as well as utilized an over-the-counter stool softener such as Colace, Senokot S. and/or MiraLAX. Patient voiced understanding. He was also counseled regarding smoking cessation by our thoracic an alligator, Norton Blizzard RN. He usually smokes 2 cigarettes daily and has only had a half of cigarettes a day. Patient was discussed with and also seen by Dr. Julien Nordmann. He will continue with weekly labs as scheduled and return in 2 weeks for another symptom management visit prior to the start of cycle #2 of his systemic chemotherapy with carboplatin and Alimta.  He was advised to call immediately if he has any concerning symptoms in the interval.  The patient voices understanding of  current disease status and treatment options and is in agreement with the current care plan.  All questions were answered. The patient knows to call the clinic with any problems, questions or concerns. We can certainly see the patient much sooner if necessary.  Carlton Adam, PA-C   Disclaimer: This note was dictated with voice recognition software. Similar sounding words can inadvertently be transcribed and may not be corrected upon review.  ADDENDUM: Hematology/Oncology Attending: I had a face to face encounter with the patient today. I recommended his care plan. This is a very pleasant 68 years old [place] American male with metastatic non-small cell lung cancer with brain metastasis status post stereotactic radiotherapy to the brain lesions and currently undergoing systemic chemotherapy with carboplatin and Alimta status post 1 cycle. The patient related the first cycle of his treatment fairly well with no significant adverse effects. I recommended for the patient to continue his treatment as scheduled. He would come back for follow up visit in 2 weeks with the start of cycle #2. He was advised to call  immediately if he has any concerning symptoms in the interval.  Disclaimer: This note was dictated with voice recognition software. Similar sounding words can inadvertently be transcribed and may not be corrected upon review. Eilleen Kempf., MD 02/10/2013

## 2013-02-10 NOTE — Patient Instructions (Signed)
Continue weekly labs as scheduled Followup in 2 weeks, prior to the start of her next cycle of chemotherapy

## 2013-02-10 NOTE — Progress Notes (Signed)
Spoke with pt today at [facility] regarding smoking cessation.  Pt. Stated he smoked half cigarette today.  I encouraged him to get back on tract.  He stated he would.

## 2013-02-10 NOTE — Telephone Encounter (Signed)
Gave pt appt for lab,md and chemo for february , emailed and left vm for [place] reagrding chemo

## 2013-02-10 NOTE — Telephone Encounter (Signed)
Per staff message and POF I have scheduled appts.  JMW  

## 2013-02-12 ENCOUNTER — Telehealth: Payer: Self-pay | Admitting: Internal Medicine

## 2013-02-12 NOTE — Telephone Encounter (Signed)
Talked to pt's wife gave her app for February 2015 lab,md and chemo

## 2013-02-16 ENCOUNTER — Encounter: Payer: Self-pay | Admitting: Internal Medicine

## 2013-02-16 ENCOUNTER — Encounter: Payer: Self-pay | Admitting: Physician Assistant

## 2013-02-17 ENCOUNTER — Other Ambulatory Visit (HOSPITAL_BASED_OUTPATIENT_CLINIC_OR_DEPARTMENT_OTHER): Payer: Medicare Other

## 2013-02-17 ENCOUNTER — Telehealth: Payer: Self-pay | Admitting: *Deleted

## 2013-02-17 ENCOUNTER — Other Ambulatory Visit: Payer: Self-pay | Admitting: *Deleted

## 2013-02-17 ENCOUNTER — Ambulatory Visit (HOSPITAL_COMMUNITY)
Admission: RE | Admit: 2013-02-17 | Discharge: 2013-02-17 | Disposition: A | Discharge status: Home / Self Care | Payer: Medicare Other | Source: Ambulatory Visit | Attending: Internal Medicine | Admitting: Internal Medicine

## 2013-02-17 ENCOUNTER — Ambulatory Visit: Payer: Medicare Other

## 2013-02-17 DIAGNOSIS — C3490 Malignant neoplasm of unspecified part of unspecified bronchus or lung: Secondary | ICD-10-CM

## 2013-02-17 DIAGNOSIS — C770 Secondary and unspecified malignant neoplasm of lymph nodes of head, face and neck: Secondary | ICD-10-CM

## 2013-02-17 DIAGNOSIS — D6481 Anemia due to antineoplastic chemotherapy: Secondary | ICD-10-CM | POA: Insufficient documentation

## 2013-02-17 DIAGNOSIS — C7949 Secondary malignant neoplasm of other parts of nervous system: Secondary | ICD-10-CM

## 2013-02-17 DIAGNOSIS — D649 Anemia, unspecified: Secondary | ICD-10-CM

## 2013-02-17 DIAGNOSIS — T451X5A Adverse effect of antineoplastic and immunosuppressive drugs, initial encounter: Secondary | ICD-10-CM | POA: Insufficient documentation

## 2013-02-17 DIAGNOSIS — C3410 Malignant neoplasm of upper lobe, unspecified bronchus or lung: Secondary | ICD-10-CM

## 2013-02-17 DIAGNOSIS — C7931 Secondary malignant neoplasm of brain: Secondary | ICD-10-CM

## 2013-02-17 LAB — PREPARE RBC (CROSSMATCH)

## 2013-02-17 LAB — COMPREHENSIVE METABOLIC PANEL
ALT: 38 U/L (ref 0–53)
AST: 17 U/L (ref 0–37)
Albumin: 3.6 g/dL (ref 3.5–5.2)
Alkaline Phosphatase: 48 U/L (ref 39–117)
BUN: 26 mg/dL — AB (ref 6–23)
CALCIUM: 8.5 mg/dL (ref 8.4–10.5)
CHLORIDE: 104 meq/L (ref 96–112)
CO2: 28 meq/L (ref 19–32)
Creatinine, Ser: 1.3 mg/dL (ref 0.50–1.35)
Glucose, Bld: 109 mg/dL — ABNORMAL HIGH (ref 70–99)
Potassium: 4.5 mEq/L (ref 3.5–5.3)
Sodium: 141 mEq/L (ref 135–145)
Total Bilirubin: 0.2 mg/dL (ref 0.2–1.2)
Total Protein: 6 g/dL (ref 6.0–8.3)

## 2013-02-17 LAB — CBC WITH DIFFERENTIAL/PLATELET
BASO%: 0 % (ref 0.0–2.0)
BASOS ABS: 0 10*3/uL (ref 0.0–0.1)
EOS%: 0 % (ref 0.0–7.0)
Eosinophils Absolute: 0 10*3/uL (ref 0.0–0.5)
HCT: 24.6 % — ABNORMAL LOW (ref 38.4–49.9)
HEMOGLOBIN: 7.7 g/dL — AB (ref 13.0–17.1)
LYMPH%: 10.5 % — AB (ref 14.0–49.0)
MCH: 28.9 pg (ref 27.2–33.4)
MCHC: 31.3 g/dL — ABNORMAL LOW (ref 32.0–36.0)
MCV: 92.5 fL (ref 79.3–98.0)
MONO#: 0.5 10*3/uL (ref 0.1–0.9)
MONO%: 7.8 % (ref 0.0–14.0)
NEUT#: 5.2 10*3/uL (ref 1.5–6.5)
NEUT%: 81.7 % — AB (ref 39.0–75.0)
PLATELETS: 91 10*3/uL — AB (ref 140–400)
RBC: 2.66 10*6/uL — ABNORMAL LOW (ref 4.20–5.82)
RDW: 17.1 % — AB (ref 11.0–14.6)
WBC: 6.4 10*3/uL (ref 4.0–10.3)
lymph#: 0.7 10*3/uL — ABNORMAL LOW (ref 0.9–3.3)

## 2013-02-17 LAB — ABO/RH: ABO/RH(D): AB POS

## 2013-02-17 NOTE — Telephone Encounter (Signed)
02/15/13 NOSEBLEED X2 DARK RED FOR 30 SECONDS. 02/16/13 NOSEBLEED X1 DARK RED IN TISSUE WHEN HE BLEW HIS NOSE. PT. ALSO COUGHED UP X1 A SMALL AMOUNT OF DARK RED BLOOD TODAY. PT. STATES HE IS SHORT OF BREATH. HIS HGB IS 7.7. NOTIFIED DR.MOHAMED'S NURSE, STEPHANIE JOHNSON,RN. NOTIFIED PT. THE ABOVE INFORMATION WAS GIVEN TO DR.MOHAMED'S NURSE AND SHE WILL BE HANDLING THIS SITUATION. PT. VOICES UNDERSTANDING.

## 2013-02-18 ENCOUNTER — Ambulatory Visit (HOSPITAL_BASED_OUTPATIENT_CLINIC_OR_DEPARTMENT_OTHER): Payer: Medicare Other

## 2013-02-18 ENCOUNTER — Other Ambulatory Visit: Payer: Self-pay | Admitting: *Deleted

## 2013-02-18 ENCOUNTER — Ambulatory Visit: Payer: Medicare Other

## 2013-02-18 ENCOUNTER — Other Ambulatory Visit: Payer: Self-pay | Admitting: Medical Oncology

## 2013-02-18 ENCOUNTER — Other Ambulatory Visit: Payer: Self-pay | Admitting: Physician Assistant

## 2013-02-18 VITALS — BP 135/79 | HR 81 | Temp 97.5°F | Resp 16

## 2013-02-18 DIAGNOSIS — D649 Anemia, unspecified: Secondary | ICD-10-CM

## 2013-02-18 DIAGNOSIS — T451X5A Adverse effect of antineoplastic and immunosuppressive drugs, initial encounter: Principal | ICD-10-CM

## 2013-02-18 DIAGNOSIS — C3490 Malignant neoplasm of unspecified part of unspecified bronchus or lung: Secondary | ICD-10-CM

## 2013-02-18 DIAGNOSIS — D6481 Anemia due to antineoplastic chemotherapy: Secondary | ICD-10-CM

## 2013-02-18 LAB — TYPE AND SCREEN
ABO/RH(D): AB POS
ANTIBODY SCREEN: NEGATIVE
UNIT DIVISION: 0
Unit division: 0

## 2013-02-18 MED ORDER — ACETAMINOPHEN 325 MG PO TABS
650.0000 mg | ORAL_TABLET | Freq: Once | ORAL | Status: AC
  Administered 2013-02-18: 650 mg via ORAL

## 2013-02-18 MED ORDER — DIPHENHYDRAMINE HCL 25 MG PO CAPS
25.0000 mg | ORAL_CAPSULE | Freq: Once | ORAL | Status: AC
  Administered 2013-02-18: 25 mg via ORAL

## 2013-02-18 MED ORDER — ACETAMINOPHEN 325 MG PO TABS
ORAL_TABLET | ORAL | Status: AC
  Filled 2013-02-18: qty 2

## 2013-02-18 MED ORDER — DIPHENHYDRAMINE HCL 25 MG PO CAPS: 25.0000 mg | ORAL_CAPSULE | Freq: Once | ORAL | Status: AC

## 2013-02-18 MED ORDER — ACETAMINOPHEN 325 MG PO TABS: 650.0000 mg | ORAL_TABLET | Freq: Once | ORAL | Status: AC

## 2013-02-18 MED ORDER — SODIUM CHLORIDE 0.9 % IV SOLN
250.0000 mL | Freq: Once | INTRAVENOUS | Status: AC
  Administered 2013-02-18: 250 mL via INTRAVENOUS

## 2013-02-18 MED ORDER — DIPHENHYDRAMINE HCL 25 MG PO CAPS
ORAL_CAPSULE | ORAL | Status: AC
  Filled 2013-02-18: qty 1

## 2013-02-18 MED ORDER — DIPHENHYDRAMINE HCL 25 MG PO CAPS
ORAL_CAPSULE | ORAL | Status: AC
Start: 2013-02-18 — End: 2013-02-18
  Filled 2013-02-18: qty 1

## 2013-02-18 MED ORDER — SODIUM CHLORIDE 0.9 % IV SOLN: Freq: Once | INTRAVENOUS | Status: DC

## 2013-02-18 NOTE — Patient Instructions (Signed)
Blood Transfusion  A blood transfusion replaces your blood or some of its parts. Blood is replaced when you have lost blood because of surgery, an accident, or for severe blood conditions like anemia. You can donate blood to be used on yourself if you have a planned surgery. If you lose blood during that surgery, your own blood can be given back to you. Any blood given to you is checked to make sure it matches your blood type. Your temperature, blood pressure, and heart rate (vital signs) will be checked often.  GET HELP RIGHT AWAY IF:   You feel sick to your stomach (nauseous) or throw up (vomit).  You have watery poop (diarrhea).  You have shortness of breath or trouble breathing.  You have blood in your pee (urine) or have dark colored pee.  You have chest pain or tightness.  Your eyes or skin turn yellow (jaundice).  You have a temperature by mouth above 102 F (38.9 C), not controlled by medicine.  You start to shake and have chills.  You develop a a red rash (hives) or feel itchy.  You develop lightheadedness or feel confused.  You develop back, joint, or muscle pain.  You do not feel hungry (lost appetite).  You feel tired, restless, or nervous.  You develop belly (abdominal) cramps. Document Released: 03/16/2008 Document Revised: 03/12/2011 Document Reviewed: 03/16/2008 [employer] Patient Information 2014 [employer].  

## 2013-02-19 LAB — TYPE AND SCREEN
ABO/RH(D): AB POS
Antibody Screen: NEGATIVE
Unit division: 0
Unit division: 0

## 2013-02-19 LAB — PREPARE RBC (CROSSMATCH)

## 2013-02-20 ENCOUNTER — Telehealth: Payer: Self-pay | Admitting: *Deleted

## 2013-02-20 NOTE — Telephone Encounter (Signed)
Pt called left msg stating that he is still having issues with his nose bleeding.  Plt count 91.  Attempted to call pt back, no answer, Informed pt in vm that it could be the dry cold weather and to use saline spray prn.  SLJ

## 2013-02-21 ENCOUNTER — Encounter (HOSPITAL_COMMUNITY): Payer: Self-pay | Admitting: Emergency Medicine

## 2013-02-21 ENCOUNTER — Emergency Department (HOSPITAL_COMMUNITY): Payer: Medicare Other

## 2013-02-21 ENCOUNTER — Emergency Department (HOSPITAL_COMMUNITY)
Admission: EM | Admit: 2013-02-21 | Discharge: 2013-02-21 | Disposition: A | Discharge status: Home / Self Care | Payer: Medicare Other | Attending: Emergency Medicine | Admitting: Emergency Medicine

## 2013-02-21 DIAGNOSIS — R61 Generalized hyperhidrosis: Secondary | ICD-10-CM | POA: Insufficient documentation

## 2013-02-21 DIAGNOSIS — M79605 Pain in left leg: Secondary | ICD-10-CM

## 2013-02-21 DIAGNOSIS — Z85118 Personal history of other malignant neoplasm of bronchus and lung: Secondary | ICD-10-CM | POA: Insufficient documentation

## 2013-02-21 DIAGNOSIS — Z79899 Other long term (current) drug therapy: Secondary | ICD-10-CM | POA: Insufficient documentation

## 2013-02-21 DIAGNOSIS — Z7982 Long term (current) use of aspirin: Secondary | ICD-10-CM | POA: Insufficient documentation

## 2013-02-21 DIAGNOSIS — M79609 Pain in unspecified limb: Secondary | ICD-10-CM | POA: Insufficient documentation

## 2013-02-21 DIAGNOSIS — R040 Epistaxis: Secondary | ICD-10-CM | POA: Insufficient documentation

## 2013-02-21 DIAGNOSIS — R0602 Shortness of breath: Secondary | ICD-10-CM | POA: Insufficient documentation

## 2013-02-21 DIAGNOSIS — F17200 Nicotine dependence, unspecified, uncomplicated: Secondary | ICD-10-CM | POA: Insufficient documentation

## 2013-02-21 DIAGNOSIS — M79604 Pain in right leg: Secondary | ICD-10-CM

## 2013-02-21 DIAGNOSIS — I10 Essential (primary) hypertension: Secondary | ICD-10-CM | POA: Insufficient documentation

## 2013-02-21 LAB — URINALYSIS, ROUTINE W REFLEX MICROSCOPIC
Bilirubin Urine: NEGATIVE
Glucose, UA: 100 mg/dL — AB
Hgb urine dipstick: NEGATIVE
KETONES UR: NEGATIVE mg/dL
NITRITE: NEGATIVE
PROTEIN: NEGATIVE mg/dL
Specific Gravity, Urine: 1.021 (ref 1.005–1.030)
Urobilinogen, UA: 1 mg/dL (ref 0.0–1.0)
pH: 5.5 (ref 5.0–8.0)

## 2013-02-21 LAB — URINE MICROSCOPIC-ADD ON

## 2013-02-21 LAB — COMPREHENSIVE METABOLIC PANEL
ALK PHOS: 60 U/L (ref 39–117)
ALT: 42 U/L (ref 0–53)
AST: 22 U/L (ref 0–37)
Albumin: 2.9 g/dL — ABNORMAL LOW (ref 3.5–5.2)
BUN: 26 mg/dL — AB (ref 6–23)
CHLORIDE: 102 meq/L (ref 96–112)
CO2: 26 mEq/L (ref 19–32)
CREATININE: 1.34 mg/dL (ref 0.50–1.35)
Calcium: 8.5 mg/dL (ref 8.4–10.5)
GFR calc Af Amer: 62 mL/min — ABNORMAL LOW (ref >90)
GFR calc non Af Amer: 53 mL/min — ABNORMAL LOW (ref >90)
GLUCOSE: 187 mg/dL — AB (ref 70–99)
POTASSIUM: 4.3 meq/L (ref 3.7–5.3)
Sodium: 141 mEq/L (ref 137–147)
Total Protein: 6 g/dL (ref 6.0–8.3)

## 2013-02-21 LAB — CBC WITH DIFFERENTIAL/PLATELET
Basophils Absolute: 0 10*3/uL (ref 0.0–0.1)
Basophils Relative: 0 % (ref 0–1)
EOS ABS: 0 10*3/uL (ref 0.0–0.7)
Eosinophils Relative: 0 % (ref 0–5)
HCT: 28.6 % — ABNORMAL LOW (ref 39.0–52.0)
Hemoglobin: 9.4 g/dL — ABNORMAL LOW (ref 13.0–17.0)
Lymphocytes Relative: 11 % — ABNORMAL LOW (ref 12–46)
Lymphs Abs: 0.7 10*3/uL (ref 0.7–4.0)
MCH: 30.2 pg (ref 26.0–34.0)
MCHC: 32.9 g/dL (ref 30.0–36.0)
MCV: 92 fL (ref 78.0–100.0)
MONO ABS: 0.4 10*3/uL (ref 0.1–1.0)
Monocytes Relative: 6 % (ref 3–12)
Neutro Abs: 5.5 10*3/uL (ref 1.7–7.7)
Neutrophils Relative %: 83 % — ABNORMAL HIGH (ref 43–77)
Platelets: 101 10*3/uL — ABNORMAL LOW (ref 150–400)
RBC: 3.11 MIL/uL — ABNORMAL LOW (ref 4.22–5.81)
RDW: 17.6 % — AB (ref 11.5–15.5)
WBC: 6.6 10*3/uL (ref 4.0–10.5)
nRBC: 4 /100 WBC — ABNORMAL HIGH

## 2013-02-21 MED ORDER — OXYCODONE-ACETAMINOPHEN 5-325 MG PO TABS: 1.0000 | ORAL_TABLET | ORAL | Status: DC | PRN

## 2013-02-21 MED ORDER — OXYCODONE-ACETAMINOPHEN 5-325 MG PO TABS
1.0000 | ORAL_TABLET | Freq: Once | ORAL | Status: AC
Start: 2013-02-21 — End: 2013-02-21
  Administered 2013-02-21: 1 via ORAL
  Filled 2013-02-21: qty 1

## 2013-02-21 NOTE — ED Provider Notes (Signed)
CSN: [id number]     Arrival date & time 02/21/13  1047 History   First MD Initiated Contact with Patient 02/21/13 1116     Chief Complaint  Patient presents with  . Leg Pain     (Consider location/radiation/quality/duration/timing/severity/associated sxs/prior Treatment) The history is provided by the patient and medical records.   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  The pain does not radiate.  Is moderate but enough to keep him up at night.  States it is worse at night and first thing in the morning.  He is currently having no pain at rest.  Pain is exacerbated with walking.  Has mildly worsened SOB from baseline.  Had one nightsweat last night.  No known fevers.  Denies extremity swelling.  Denies chest pain.  Does not have any pain medication at home.  Denies any injury or recent fall.    Past Medical History  Diagnosis Date  . Medical history non-contributory   . Hypertension   . Lung cancer    Past Surgical History  Procedure Laterality Date  . No past surgeries     Family History  Problem Relation Age of Onset  . Heart Problems Mother   . Cancer Mother 47    lung cancer  . Cancer Father 75    pancreas cancer   History  Substance Use Topics  . Smoking status: Current Every Day Smoker -- 0.50 packs/day for 25 years    Types: Cigarettes  . Smokeless tobacco: Never Used  . Alcohol Use: Yes     Comment: occasionally    Review of Systems  Constitutional: Positive for diaphoresis. Negative for fever and chills.  HENT: Positive for nosebleeds (lasting seconds or only with blowing nose).   Respiratory: Positive for shortness of breath. Negative for cough.   Cardiovascular: Negative for chest pain and leg swelling.  Gastrointestinal: Negative for nausea, vomiting, abdominal pain, diarrhea and blood in stool.  Genitourinary: Negative  for dysuria, urgency, frequency and hematuria.  Musculoskeletal: Negative for back pain and gait problem.  Skin: Negative for color change and wound.  Neurological: Negative for dizziness, weakness, light-headedness and numbness.  Hematological: Does not bruise/bleed easily (no other bleeding besides epistaxis).      Allergies  Review of patient's allergies indicates no known allergies.  Home Medications   Current Outpatient Rx  Name  Route  Sig  Dispense  Refill  . amLODipine (NORVASC) 10 MG tablet   Oral   Take 1 tablet (10 mg total) by mouth daily.   30 tablet   1   . aspirin EC 325 MG EC tablet   Oral   Take 1 tablet (325 mg total) by mouth daily.   30 tablet   0   . dexamethasone (DECADRON) 4 MG tablet   Oral   Take 1 tablet (4 mg total) by mouth every 6 (six) hours.   120 tablet   1   . dexamethasone (DECADRON) 4 MG tablet      4 mg by mouth twice a day the day before, day of and day after the chemotherapy every 3 weeks   40 tablet   1   . folic acid (FOLVITE) 1 MG tablet   Oral   Take 1 tablet (1 mg total) by mouth daily.   30 tablet   3   .  hydrALAZINE (APRESOLINE) 25 MG tablet   Oral   Take 1 tablet (25 mg total) by mouth every 8 (eight) hours.   90 tablet   1   . levETIRAcetam (KEPPRA) 1000 MG tablet   Oral   Take 1 tablet (1,000 mg total) by mouth 2 (two) times daily.   60 tablet   1   . lisinopril (PRINIVIL,ZESTRIL) 5 MG tablet   Oral   Take 1 tablet (5 mg total) by mouth daily.   30 tablet   1   . pantoprazole (PROTONIX) 40 MG tablet   Oral   Take 1 tablet (40 mg total) by mouth daily.   30 tablet   1   . prochlorperazine (COMPAZINE) 10 MG tablet   Oral   Take 1 tablet (10 mg total) by mouth every 6 (six) hours as needed for nausea or vomiting.   60 tablet   0   . zolpidem (AMBIEN) 10 MG tablet   Oral   Take 10 mg by mouth at bedtime as needed for sleep. With 1 refill called rx to [facility] [phone]          BP 149/78   Pulse 97  Temp(Src) 97.9 F (36.6 C) (Oral)  Resp 18  SpO2 100% Physical Exam  Nursing note and vitals reviewed. Constitutional: He appears well-developed and well-nourished. No distress.  HENT:  Head: Normocephalic and atraumatic.  Neck: Neck supple.  Cardiovascular: Normal rate and regular rhythm.   Pulmonary/Chest: Effort normal and breath sounds normal. No respiratory distress. He has no wheezes. He has no rales.  Abdominal: Soft. He exhibits no distension and no mass. There is no tenderness. There is no rebound and no guarding.  Musculoskeletal:       Right hip: Normal.       Left hip: Normal.       Right knee: Normal.       Left knee: Normal.       Right upper leg: Normal.       Left upper leg: Normal.  Lower extremities:  Strength 5/5, sensation intact, distal pulses intact.   NO edema, erythema, warmth.    Neurological: He is alert. He exhibits normal muscle tone.  Skin: He is not diaphoretic.    ED Course  Procedures (including critical care time) Labs Review Labs Reviewed  CBC WITH DIFFERENTIAL - Abnormal; Notable for the following:    RBC 3.11 (*)    Hemoglobin 9.4 (*)    HCT 28.6 (*)    RDW 17.6 (*)    Platelets 101 (*)    Neutrophils Relative % 83 (*)    Lymphocytes Relative 11 (*)    nRBC 4 (*)    All other components within normal limits  COMPREHENSIVE METABOLIC PANEL - Abnormal; Notable for the following:    Glucose, Bld 187 (*)    BUN 26 (*)    Albumin 2.9 (*)    Total Bilirubin <0.2 (*)    GFR calc non Af Amer 53 (*)    GFR calc Af Amer 62 (*)    All other components within normal limits  URINALYSIS, ROUTINE W REFLEX MICROSCOPIC - Abnormal; Notable for the following:    Glucose, UA 100 (*)    Leukocytes, UA MODERATE (*)    All other components within normal limits  URINE MICROSCOPIC-ADD ON   Imaging Review Dg Chest 2 View  02/21/2013   CLINICAL DATA:  Hypertension, history of metastatic lung cancer  EXAM: CHEST  2 VIEW  COMPARISON:   07/11/2012  FINDINGS: Stable heart size and vascularity. Lungs remain clear. 7 mm right upper low spiculated density, faintly visualized by chest x-ray overlying the first anterior rib shadow. This is stable in size. No new superimposed pneumonia, collapse or consolidation. No edema, effusion or pneumothorax. Trachea midline.  IMPRESSION: Stable chest exam.  No superimposed acute process   Electronically Signed   By: Daryll Brod M.D.   On: 02/21/2013 12:20    EKG Interpretation   None      12:34 PM Discussed patient and plan with Dr Wilson Singer.   12:56 PM Patient reports he is feeling much better.  Denies any pain.    MDM   Final diagnoses:  Bilateral leg pain   Afebrile, nontoxic pt with recent diagnosis of stage IV lung cancer c/o achy bilateral leg pain and arm pain.  There is no swelling and no pain at rest.  He is having pain that is worse at certain times of day and with walking.  He is asymptomatic in the ED.  Labs, UA, CXR unremarkable.  No known mets in the areas he is having pain.  Seems to be simple musculoskeletal pain. Exam unremarkable.  D/C home with percocet, PCP and oncology follow up.  Discussed result, findings, treatment, and follow up  with patient.  Pt given return precautions.  Pt verbalizes understanding and agrees with plan.      I doubt any other EMC precluding discharge at this time including, but not necessarily limited to the following: DVT, septic joint   Clayton Bibles, PA-C 02/21/13 1446

## 2013-02-21 NOTE — Discharge Instructions (Signed)
Read the information below.  Use the prescribed medication as directed.  Please discuss all new medications with your pharmacist.  Do not take additional tylenol while taking the prescribed pain medication to avoid overdose.  You may return to the Emergency Department at any time for worsening condition or any new symptoms that concern you.  If you develop uncontrolled pain, weakness or numbness of the extremity, severe discoloration of the skin, leg swelling, or you are unable to walk, return to the ER for a recheck.      Musculoskeletal Pain Musculoskeletal pain is muscle and boney aches and pains. These pains can occur in any part of the body. Your caregiver may treat you without knowing the cause of the pain. They may treat you if blood or urine tests, X-rays, and other tests were normal.  CAUSES There is often not a definite cause or reason for these pains. These pains may be caused by a type of germ (virus). The discomfort may also come from overuse. Overuse includes working out too hard when your body is not fit. Boney aches also come from weather changes. Bone is sensitive to atmospheric pressure changes. HOME CARE INSTRUCTIONS   Ask when your test results will be ready. Make sure you get your test results.  Only take over-the-counter or prescription medicines for pain, discomfort, or fever as directed by your caregiver. If you were given medications for your condition, do not drive, operate machinery or power tools, or sign legal documents for 24 hours. Do not drink alcohol. Do not take sleeping pills or other medications that may interfere with treatment.  Continue all activities unless the activities cause more pain. When the pain lessens, slowly resume normal activities. Gradually increase the intensity and duration of the activities or exercise.  During periods of severe pain, bed rest may be helpful. Lay or sit in any position that is comfortable.  Putting ice on the injured  area.  Put ice in a bag.  Place a towel between your skin and the bag.  Leave the ice on for 15 to 20 minutes, 3 to 4 times a day.  Follow up with your caregiver for continued problems and no reason can be found for the pain. If the pain becomes worse or does not go away, it may be necessary to repeat tests or do additional testing. Your caregiver may need to look further for a possible cause. SEEK IMMEDIATE MEDICAL CARE IF:  You have pain that is getting worse and is not relieved by medications.  You develop chest pain that is associated with shortness or breath, sweating, feeling sick to your stomach (nauseous), or throw up (vomit).  Your pain becomes localized to the abdomen.  You develop any new symptoms that seem different or that concern you. MAKE SURE YOU:   Understand these instructions.  Will watch your condition.  Will get help right away if you are not doing well or get worse. Document Released: 12/18/2004 Document Revised: 03/12/2011 Document Reviewed: 08/22/2012 [facility] Patient Information 2014 [place].

## 2013-02-21 NOTE — ED Notes (Signed)
Per pt, history of lung cancer-started having leg pain last night-unable to sleep

## 2013-02-23 ENCOUNTER — Telehealth: Payer: Self-pay | Admitting: Medical Oncology

## 2013-02-23 NOTE — Telephone Encounter (Signed)
I called pt to get update and hsi father said he was not at home. I left message for Wash to return my call.

## 2013-02-23 NOTE — Telephone Encounter (Addendum)
Pt calls requesting to be seen for new onset of facial swelling " i look like a different person". Was in Ed for leg pain on sat. Started new rx percocet.Denies shortness of breath  Or rash . States he has balck spots on his shoulders and knot on left calf. Last decadron was 2/3 prior to  Chemo and day after,

## 2013-02-24 ENCOUNTER — Encounter: Payer: Self-pay | Admitting: Internal Medicine

## 2013-02-24 ENCOUNTER — Ambulatory Visit (HOSPITAL_BASED_OUTPATIENT_CLINIC_OR_DEPARTMENT_OTHER): Payer: Medicare Other

## 2013-02-24 ENCOUNTER — Ambulatory Visit (HOSPITAL_BASED_OUTPATIENT_CLINIC_OR_DEPARTMENT_OTHER): Payer: Medicare Other | Admitting: Internal Medicine

## 2013-02-24 ENCOUNTER — Other Ambulatory Visit (HOSPITAL_BASED_OUTPATIENT_CLINIC_OR_DEPARTMENT_OTHER): Payer: Medicare Other

## 2013-02-24 ENCOUNTER — Telehealth: Payer: Self-pay | Admitting: Internal Medicine

## 2013-02-24 ENCOUNTER — Telehealth: Payer: Self-pay | Admitting: *Deleted

## 2013-02-24 VITALS — BP 149/78 | HR 96 | Temp 98.3°F | Resp 20 | Ht 76.0 in | Wt 181.8 lb

## 2013-02-24 DIAGNOSIS — C3410 Malignant neoplasm of upper lobe, unspecified bronchus or lung: Secondary | ICD-10-CM

## 2013-02-24 DIAGNOSIS — C7949 Secondary malignant neoplasm of other parts of nervous system: Secondary | ICD-10-CM

## 2013-02-24 DIAGNOSIS — C3490 Malignant neoplasm of unspecified part of unspecified bronchus or lung: Secondary | ICD-10-CM

## 2013-02-24 DIAGNOSIS — C7931 Secondary malignant neoplasm of brain: Secondary | ICD-10-CM

## 2013-02-24 DIAGNOSIS — Z5111 Encounter for antineoplastic chemotherapy: Secondary | ICD-10-CM

## 2013-02-24 LAB — COMPREHENSIVE METABOLIC PANEL (CC13)
ALT: 59 U/L — ABNORMAL HIGH (ref 0–55)
AST: 23 U/L (ref 5–34)
Albumin: 3.1 g/dL — ABNORMAL LOW (ref 3.5–5.0)
Alkaline Phosphatase: 61 U/L (ref 40–150)
Anion Gap: 10 mEq/L (ref 3–11)
BUN: 21.4 mg/dL (ref 7.0–26.0)
CO2: 23 mEq/L (ref 22–29)
Calcium: 8.8 mg/dL (ref 8.4–10.4)
Chloride: 109 mEq/L (ref 98–109)
Creatinine: 1.1 mg/dL (ref 0.7–1.3)
Glucose: 139 mg/dl (ref 70–140)
Potassium: 4.4 mEq/L (ref 3.5–5.1)
Sodium: 143 mEq/L (ref 136–145)
Total Bilirubin: <0.2 mg/dL (ref 0.20–1.20)
Total Protein: 6.2 g/dL — ABNORMAL LOW (ref 6.4–8.3)

## 2013-02-24 LAB — CBC WITH DIFFERENTIAL/PLATELET
BASO%: 0.3 % (ref 0.0–2.0)
Basophils Absolute: 0 10*3/uL (ref 0.0–0.1)
EOS%: 0.1 % (ref 0.0–7.0)
Eosinophils Absolute: 0 10*3/uL (ref 0.0–0.5)
HCT: 31.4 % — ABNORMAL LOW (ref 38.4–49.9)
HGB: 9.9 g/dL — ABNORMAL LOW (ref 13.0–17.1)
LYMPH%: 7.5 % — AB (ref 14.0–49.0)
MCH: 28.9 pg (ref 27.2–33.4)
MCHC: 31.5 g/dL — AB (ref 32.0–36.0)
MCV: 91.5 fL (ref 79.3–98.0)
MONO#: 0.6 10*3/uL (ref 0.1–0.9)
MONO%: 7.1 % (ref 0.0–14.0)
NEUT#: 7.5 10*3/uL — ABNORMAL HIGH (ref 1.5–6.5)
NEUT%: 85 % — ABNORMAL HIGH (ref 39.0–75.0)
PLATELETS: 113 10*3/uL — AB (ref 140–400)
RBC: 3.43 10*6/uL — ABNORMAL LOW (ref 4.20–5.82)
RDW: 17.5 % — ABNORMAL HIGH (ref 11.0–14.6)
WBC: 8.8 10*3/uL (ref 4.0–10.3)
lymph#: 0.7 10*3/uL — ABNORMAL LOW (ref 0.9–3.3)
nRBC: 2 % — ABNORMAL HIGH (ref 0–0)

## 2013-02-24 LAB — TECHNOLOGIST REVIEW

## 2013-02-24 MED ORDER — DEXAMETHASONE SODIUM PHOSPHATE 20 MG/5ML IJ SOLN
20.0000 mg | Freq: Once | INTRAMUSCULAR | Status: AC
  Administered 2013-02-24: 20 mg via INTRAVENOUS

## 2013-02-24 MED ORDER — SODIUM CHLORIDE 0.9 % IV SOLN
407.0000 mg | Freq: Once | INTRAVENOUS | Status: AC
  Administered 2013-02-24: 410 mg via INTRAVENOUS
  Filled 2013-02-24: qty 41

## 2013-02-24 MED ORDER — DEXAMETHASONE SODIUM PHOSPHATE 20 MG/5ML IJ SOLN
INTRAMUSCULAR | Status: AC
  Filled 2013-02-24: qty 5

## 2013-02-24 MED ORDER — ONDANSETRON 16 MG/50ML IVPB (CHCC)
INTRAVENOUS | Status: AC
  Filled 2013-02-24: qty 16

## 2013-02-24 MED ORDER — ONDANSETRON 16 MG/50ML IVPB (CHCC)
16.0000 mg | Freq: Once | INTRAVENOUS | Status: AC
  Administered 2013-02-24: 16 mg via INTRAVENOUS

## 2013-02-24 MED ORDER — SODIUM CHLORIDE 0.9 % IV SOLN
500.0000 mg/m2 | Freq: Once | INTRAVENOUS | Status: AC
  Administered 2013-02-24: 1000 mg via INTRAVENOUS
  Filled 2013-02-24: qty 40

## 2013-02-24 MED ORDER — SODIUM CHLORIDE 0.9 % IV SOLN
Freq: Once | INTRAVENOUS | Status: AC
  Administered 2013-02-24: 09:00:00 via INTRAVENOUS

## 2013-02-24 NOTE — Telephone Encounter (Signed)
Pt seen by Dr Julien Nordmann today.

## 2013-02-24 NOTE — Patient Instructions (Signed)
[facility] Discharge Instructions for Patients Receiving Chemotherapy  Today you received the following chemotherapy agents: Alimta, Carboplatin  To help prevent nausea and vomiting after your treatment, we encourage you to take your nausea medication as prescribed by your physician.    If you develop nausea and vomiting that is not controlled by your nausea medication, call the clinic.   BELOW ARE SYMPTOMS THAT SHOULD BE REPORTED IMMEDIATELY:  *FEVER GREATER THAN 100.5 F  *CHILLS WITH OR WITHOUT FEVER  NAUSEA AND VOMITING THAT IS NOT CONTROLLED WITH YOUR NAUSEA MEDICATION  *UNUSUAL SHORTNESS OF BREATH  *UNUSUAL BRUISING OR BLEEDING  TENDERNESS IN MOUTH AND THROAT WITH OR WITHOUT PRESENCE OF ULCERS  *URINARY PROBLEMS  *BOWEL PROBLEMS  UNUSUAL RASH Items with * indicate a potential emergency and should be followed up as soon as possible.  Feel free to call the clinic you have any questions or concerns. The clinic phone number is [phone].

## 2013-02-24 NOTE — Telephone Encounter (Signed)
Gave pt appt for lab and MD for [facility] emailed [place] regarding chemo

## 2013-02-24 NOTE — Patient Instructions (Signed)
Smoking Cessation, Tips for Success If you are ready to quit smoking, congratulations! You have chosen to help yourself be healthier. Cigarettes bring nicotine, tar, carbon monoxide, and other irritants into your body. Your lungs, heart, and blood vessels will be able to work better without these poisons. There are many different ways to quit smoking. Nicotine gum, nicotine patches, a nicotine inhaler, or nicotine nasal spray can help with physical craving. Hypnosis, support groups, and medicines help break the habit of smoking. WHAT THINGS CAN I DO TO MAKE QUITTING EASIER?  Here are some tips to help you quit for good:  Pick a date when you will quit smoking completely. Tell all of your friends and family about your plan to quit on that date.  Do not try to slowly cut down on the number of cigarettes you are smoking. Pick a quit date and quit smoking completely starting on that day.  Throw away all cigarettes.   Clean and remove all ashtrays from your home, work, and car.   On a card, write down your reasons for quitting. Carry the card with you and read it when you get the urge to smoke.   Cleanse your body of nicotine. Drink enough water and fluids to keep your urine clear or pale yellow. Do this after quitting to flush the nicotine from your body.   Learn to predict your moods. Do not let a bad situation be your excuse to have a cigarette. Some situations in your life might tempt you into wanting a cigarette.   Never have "just one" cigarette. It leads to wanting another and another. Remind yourself of your decision to quit.   Change habits associated with smoking. If you smoked while driving or when feeling stressed, try other activities to replace smoking. Stand up when drinking your coffee. Brush your teeth after eating. Sit in a different chair when you read the paper. Avoid alcohol while trying to quit, and try to drink fewer caffeinated beverages. Alcohol and caffeine may urge  you to smoke.   Avoid foods and drinks that can trigger a desire to smoke, such as sugary or spicy foods and alcohol.   Ask people who smoke not to smoke around you.   Have something planned to do right after eating or having a cup of coffee. For example, plan to take a walk or exercise.   Try a relaxation exercise to calm you down and decrease your stress. Remember, you may be tense and nervous for the first 2 weeks after you quit, but this will pass.   Find new activities to keep your hands busy. Play with a pen, coin, or rubber band. Doodle or draw things on paper.   Brush your teeth right after eating. This will help cut down on the craving for the taste of tobacco after meals. You can also try mouthwash.   Use oral substitutes in place of cigarettes. Try using lemon drops, carrots, cinnamon sticks, or chewing gum. Keep them handy so they are available when you have the urge to smoke.   When you have the urge to smoke, try deep breathing.   Designate your home as a nonsmoking area.   If you are a heavy smoker, ask your health care provider about a prescription for nicotine chewing gum. It can ease your withdrawal from nicotine.   Reward yourself. Set aside the cigarette money you save and buy yourself something nice.   Look for support from others. Join a support group or   smoking cessation program. Ask someone at home or at work to help you with your plan to quit smoking.   Always ask yourself, "Do I need this cigarette or is this just a reflex?" Tell yourself, "Today, I choose not to smoke," or "I do not want to smoke." You are reminding yourself of your decision to quit.  Do not replace cigarette smoking with electronic cigarettes (commonly called e-cigarettes). The safety of e-cigarettes is unknown, and some may contain harmful chemicals.  If you relapse, do not give up! Plan ahead and think about what you will do the next time you get the urge to smoke.  HOW WILL  I FEEL WHEN I QUIT SMOKING? You may have symptoms of withdrawal because your body is used to nicotine (the addictive substance in cigarettes). You may crave cigarettes, be irritable, feel very hungry, cough often, get headaches, or have difficulty concentrating. The withdrawal symptoms are only temporary. They are strongest when you first quit but will go away within 10 14 days. When withdrawal symptoms occur, stay in control. Think about your reasons for quitting. Remind yourself that these are signs that your body is healing and getting used to being without cigarettes. Remember that withdrawal symptoms are easier to treat than the major diseases that smoking can cause.  Even after the withdrawal is over, expect periodic urges to smoke. However, these cravings are generally short lived and will go away whether you smoke or not. Do not smoke!  WHAT RESOURCES ARE AVAILABLE TO HELP ME QUIT SMOKING? Your health care provider can direct you to community resources or hospitals for support, which may include:  Group support.  Education.  Hypnosis.  Therapy. Document Released: 09/16/2003 Document Revised: 10/08/2012 Document Reviewed: 06/05/2012 [employer] Patient Information 2014 [employer].  

## 2013-02-24 NOTE — Telephone Encounter (Signed)
Per staff message and POF I have scheduled appts.  JMW  

## 2013-02-24 NOTE — Progress Notes (Signed)
[facility] [phone]   [phone]) [phone]  OFFICE PROGRESS NOTE  [name], MD [place] [id number]  DIAGNOSIS: Stage IV (T1a, N2, M1b) non-small cell lung cancer, adenocarcinoma with brain metastasis diagnosed in January of 2015.  PRIOR THERAPY:status post stereotactic radiotherapy to the left parietal and left cerebellar lesions under the care of Dr. Lisbeth Renshaw completed 01/19/2013.  CURRENT THERAPY: Systemic chemotherapy with carboplatin for AUC of 5 and Alimta 500 mg/M2 given every 3 weeks, status post 1 cycle. First cycle on 02/03/2013.  INTERVAL HISTORY: Carlos Beasley 68 y.o. male returns to the clinic today for followup visit. The patient is feeling fine today with no specific complaints except for subjective complaint of swelling of his face. He tolerated the first cycle of his treatment was carboplatin and Alimta fairly well with no significant adverse effects. He denied having any significant nausea or vomiting, no fever or chills. The patient denied having any significant chest pain, shortness breath, cough or hemoptysis. He is here today to start cycle #2 of his chemotherapy.   MEDICAL HISTORY: Past Medical History  Diagnosis Date  . Medical history non-contributory   . Hypertension   . Lung cancer     ALLERGIES:  has No Known Allergies.  MEDICATIONS:  Current Outpatient Prescriptions  Medication Sig Dispense Refill  . amLODipine (NORVASC) 10 MG tablet Take 1 tablet (10 mg total) by mouth daily.  30 tablet  1  . aspirin EC 325 MG EC tablet Take 1 tablet (325 mg total) by mouth daily.  30 tablet  0  . dexamethasone (DECADRON) 4 MG tablet Take 1 tablet (4 mg total) by mouth every 6 (six) hours.  120 tablet  1  . dexamethasone (DECADRON) 4 MG tablet 4 mg by mouth twice a day the day before, day of and day after the chemotherapy every 3 weeks  40 tablet  1  . folic acid (FOLVITE) 1 MG tablet Take 1  tablet (1 mg total) by mouth daily.  30 tablet  3  . hydrALAZINE (APRESOLINE) 25 MG tablet Take 1 tablet (25 mg total) by mouth every 8 (eight) hours.  90 tablet  1  . levETIRAcetam (KEPPRA) 1000 MG tablet Take 1 tablet (1,000 mg total) by mouth 2 (two) times daily.  60 tablet  1  . lisinopril (PRINIVIL,ZESTRIL) 5 MG tablet Take 1 tablet (5 mg total) by mouth daily.  30 tablet  1  . oxyCODONE-acetaminophen (PERCOCET/ROXICET) 5-325 MG per tablet Take 1 tablet by mouth every 4 (four) hours as needed for moderate pain or severe pain.  15 tablet  0  . pantoprazole (PROTONIX) 40 MG tablet Take 1 tablet (40 mg total) by mouth daily.  30 tablet  1  . prochlorperazine (COMPAZINE) 10 MG tablet Take 1 tablet (10 mg total) by mouth every 6 (six) hours as needed for nausea or vomiting.  60 tablet  0  . zolpidem (AMBIEN) 10 MG tablet Take 10 mg by mouth at bedtime as needed for sleep. With 1 refill called rx to [facility] [phone]       No current facility-administered medications for this visit.    SURGICAL HISTORY:  Past Surgical History  Procedure Laterality Date  . No past surgeries      REVIEW OF SYSTEMS:  Constitutional: positive for fatigue Eyes: negative Ears, nose, mouth, throat, and face: negative Respiratory: positive for cough and dyspnea on exertion Cardiovascular: negative Gastrointestinal: negative Genitourinary:negative Integument/breast:  negative Hematologic/lymphatic: negative Musculoskeletal:negative Neurological: negative Behavioral/Psych: negative Endocrine: negative Allergic/Immunologic: negative   PHYSICAL EXAMINATION: General appearance: alert, cooperative, fatigued and no distress Head: Normocephalic, without obvious abnormality, atraumatic Neck: no adenopathy, no JVD, supple, symmetrical, trachea midline and thyroid not enlarged, symmetric, no tenderness/mass/nodules Lymph nodes: Cervical, supraclavicular, and axillary nodes normal. Resp: clear to auscultation  bilaterally Back: symmetric, no curvature. ROM normal. No CVA tenderness. Cardio: regular rate and rhythm, S1, S2 normal, no murmur, click, rub or gallop GI: soft, non-tender; bowel sounds normal; no masses,  no organomegaly Extremities: extremities normal, atraumatic, no cyanosis or edema Neurologic: Alert and oriented X 3, normal strength and tone. Normal symmetric reflexes. Normal coordination and gait  ECOG PERFORMANCE STATUS: 1 - Symptomatic but completely ambulatory  There were no vitals taken for this visit.  LABORATORY DATA: Lab Results  Component Value Date   WBC 8.8 02/24/2013   HGB 9.9* 02/24/2013   HCT 31.4* 02/24/2013   MCV 91.5 02/24/2013   PLT 113* 02/24/2013      Chemistry      Component Value Date/Time   NA 141 02/21/2013 1140   NA 141 02/10/2013 0947   K 4.3 02/21/2013 1140   K 4.4 02/10/2013 0947   CL 102 02/21/2013 1140   CO2 26 02/21/2013 1140   CO2 26 02/10/2013 0947   BUN 26* 02/21/2013 1140   BUN 30.9* 02/10/2013 0947   CREATININE 1.34 02/21/2013 1140   CREATININE 1.5* 02/10/2013 0947      Component Value Date/Time   CALCIUM 8.5 02/21/2013 1140   CALCIUM 9.0 02/10/2013 0947   ALKPHOS 60 02/21/2013 1140   ALKPHOS 49 02/10/2013 0947   AST 22 02/21/2013 1140   AST 25 02/10/2013 0947   ALT 42 02/21/2013 1140   ALT 44 02/10/2013 0947   BILITOT <0.2* 02/21/2013 1140   BILITOT <0.20 02/10/2013 0947       RADIOGRAPHIC STUDIES:  ASSESSMENT AND PLAN: This is a very pleasant 68 years old African American male recently diagnosed with metastatic non-small cell lung cancer, adenocarcinoma with negative EGFR mutation and negative ALK gene translocation. The patient has brain metastasis treated with stereotactic radiotherapy. The patient is currently on treatment with carboplatin and Alimta status post 1 cycle and tolerated the first cycle of his treatment fairly well. I recommended for him to proceed with second cycle today as scheduled.  He would come back for followup visit in  3 weeks with the start of cycle #3. He was advised to call immediately if he has any concerning symptoms in the interval.  The patient voices understanding of current disease status and treatment options and is in agreement with the current care plan.  All questions were answered. The patient knows to call the clinic with any problems, questions or concerns. We can certainly see the patient much sooner if necessary.  Disclaimer: This note was dictated with voice recognition software. Similar sounding words can inadvertently be transcribed and may not be corrected upon review.

## 2013-02-26 ENCOUNTER — Telehealth: Payer: Self-pay | Admitting: Internal Medicine

## 2013-02-26 ENCOUNTER — Encounter: Payer: Self-pay | Admitting: Radiation Oncology

## 2013-02-26 NOTE — ED Provider Notes (Signed)
Medical screening examination/treatment/procedure(s) were performed by non-physician practitioner and as supervising physician I was immediately available for consultation/collaboration.  EKG Interpretation   None        Virgel Manifold, MD 02/26/13 1426

## 2013-02-26 NOTE — Telephone Encounter (Signed)
Called pt left message regarding labs on 3/3/ and advise pt to get appt calendar for [facility]

## 2013-03-02 ENCOUNTER — Encounter (HOSPITAL_COMMUNITY): Payer: Self-pay

## 2013-03-02 ENCOUNTER — Ambulatory Visit: Admission: RE | Admit: 2013-03-02 | Payer: Medicare Other | Source: Ambulatory Visit | Admitting: Radiation Oncology

## 2013-03-02 HISTORY — DX: Personal history of irradiation: Z92.3

## 2013-03-03 ENCOUNTER — Other Ambulatory Visit: Payer: Medicare Other

## 2013-03-03 ENCOUNTER — Other Ambulatory Visit (HOSPITAL_BASED_OUTPATIENT_CLINIC_OR_DEPARTMENT_OTHER): Payer: Medicare Other

## 2013-03-03 ENCOUNTER — Encounter: Payer: Self-pay | Admitting: *Deleted

## 2013-03-03 ENCOUNTER — Telehealth: Payer: Self-pay | Admitting: *Deleted

## 2013-03-03 DIAGNOSIS — C7931 Secondary malignant neoplasm of brain: Secondary | ICD-10-CM

## 2013-03-03 DIAGNOSIS — C3410 Malignant neoplasm of upper lobe, unspecified bronchus or lung: Secondary | ICD-10-CM

## 2013-03-03 DIAGNOSIS — C7949 Secondary malignant neoplasm of other parts of nervous system: Secondary | ICD-10-CM

## 2013-03-03 DIAGNOSIS — C3490 Malignant neoplasm of unspecified part of unspecified bronchus or lung: Secondary | ICD-10-CM

## 2013-03-03 LAB — CBC WITH DIFFERENTIAL/PLATELET
BASO%: 0.1 % (ref 0.0–2.0)
Basophils Absolute: 0 10*3/uL (ref 0.0–0.1)
EOS%: 0 % (ref 0.0–7.0)
Eosinophils Absolute: 0 10*3/uL (ref 0.0–0.5)
HCT: 29.2 % — ABNORMAL LOW (ref 38.4–49.9)
HGB: 9.5 g/dL — ABNORMAL LOW (ref 13.0–17.1)
LYMPH%: 36.3 % (ref 14.0–49.0)
MCH: 29.9 pg (ref 27.2–33.4)
MCHC: 32.7 g/dL (ref 32.0–36.0)
MCV: 91.6 fL (ref 79.3–98.0)
MONO#: 0.2 10*3/uL (ref 0.1–0.9)
MONO%: 9.2 % (ref 0.0–14.0)
NEUT%: 54.4 % (ref 39.0–75.0)
NEUTROS ABS: 1.1 10*3/uL — AB (ref 1.5–6.5)
Platelets: 75 10*3/uL — ABNORMAL LOW (ref 140–400)
RBC: 3.19 10*6/uL — AB (ref 4.20–5.82)
RDW: 16.8 % — ABNORMAL HIGH (ref 11.0–14.6)
WBC: 2 10*3/uL — AB (ref 4.0–10.3)
lymph#: 0.7 10*3/uL — ABNORMAL LOW (ref 0.9–3.3)

## 2013-03-03 LAB — COMPREHENSIVE METABOLIC PANEL (CC13)
ALT: 90 U/L — ABNORMAL HIGH (ref 0–55)
ANION GAP: 10 meq/L (ref 3–11)
AST: 30 U/L (ref 5–34)
Albumin: 3.2 g/dL — ABNORMAL LOW (ref 3.5–5.0)
Alkaline Phosphatase: 68 U/L (ref 40–150)
BUN: 24.7 mg/dL (ref 7.0–26.0)
CALCIUM: 9.2 mg/dL (ref 8.4–10.4)
CO2: 29 meq/L (ref 22–29)
Chloride: 106 mEq/L (ref 98–109)
Creatinine: 1.2 mg/dL (ref 0.7–1.3)
Glucose: 137 mg/dl (ref 70–140)
Potassium: 4.3 mEq/L (ref 3.5–5.1)
SODIUM: 145 meq/L (ref 136–145)
TOTAL PROTEIN: 6.8 g/dL (ref 6.4–8.3)
Total Bilirubin: 0.28 mg/dL (ref 0.20–1.20)

## 2013-03-03 NOTE — [facility] Oncology Navigator Note (Unsigned)
Spoke with patient today at [facility], regarding smoking cessation.  He stated he has cut back but continues to smoke.  I asked if he would like to talk about strategies to quit.  He stated no that he would work on quitting by himself.  I let him know I am here to help if needed.

## 2013-03-03 NOTE — Telephone Encounter (Signed)
Pt came as a walk in with possible infected thumb complaint.  Spoke with pt in the lobby.  Noted left thumb without drainage - pt stated pus coming out of side of thumb yesterday.  Pt denied pain, denied fever, denied chills.  No swelling noted.  Pt here for lab only.  Pt also stated only in the morning, pt noted very small amount of blood when pt blows his nose. Dr. Julien Nordmann notified of pt's complaints.  MD also reviewed lab results today.   Spoke with pt again and instructed pt re:  Per Dr. Julien Nordmann,  Apply Neosporin ointment  ( OTC ) to affected left thumb and to keep it clean.  If pt develops fever, chills, or symptoms worsens, pt needs to call Dr. Worthy Flank office for further instructions.  Pt voiced understanding.

## 2013-03-06 ENCOUNTER — Encounter: Payer: Self-pay | Admitting: Oncology

## 2013-03-07 ENCOUNTER — Inpatient Hospital Stay (HOSPITAL_COMMUNITY)
Admission: EM | Admit: 2013-03-07 | Discharge: 2013-03-15 | DRG: 064 | Disposition: A | Discharge status: Skilled Nursing Facility | Payer: Medicare Other | Attending: Internal Medicine | Admitting: Internal Medicine

## 2013-03-07 ENCOUNTER — Encounter (HOSPITAL_COMMUNITY): Payer: Self-pay | Admitting: Internal Medicine

## 2013-03-07 ENCOUNTER — Other Ambulatory Visit: Payer: Self-pay

## 2013-03-07 ENCOUNTER — Emergency Department (HOSPITAL_COMMUNITY): Payer: Medicare Other

## 2013-03-07 DIAGNOSIS — I10 Essential (primary) hypertension: Secondary | ICD-10-CM | POA: Diagnosis present

## 2013-03-07 DIAGNOSIS — I6350 Cerebral infarction due to unspecified occlusion or stenosis of unspecified cerebral artery: Principal | ICD-10-CM | POA: Diagnosis present

## 2013-03-07 DIAGNOSIS — T46905A Adverse effect of unspecified agents primarily affecting the cardiovascular system, initial encounter: Secondary | ICD-10-CM | POA: Diagnosis present

## 2013-03-07 DIAGNOSIS — C3490 Malignant neoplasm of unspecified part of unspecified bronchus or lung: Secondary | ICD-10-CM

## 2013-03-07 DIAGNOSIS — Z66 Do not resuscitate: Secondary | ICD-10-CM | POA: Diagnosis not present

## 2013-03-07 DIAGNOSIS — Z800 Family history of malignant neoplasm of digestive organs: Secondary | ICD-10-CM

## 2013-03-07 DIAGNOSIS — I129 Hypertensive chronic kidney disease with stage 1 through stage 4 chronic kidney disease, or unspecified chronic kidney disease: Secondary | ICD-10-CM | POA: Diagnosis present

## 2013-03-07 DIAGNOSIS — G40909 Epilepsy, unspecified, not intractable, without status epilepticus: Secondary | ICD-10-CM

## 2013-03-07 DIAGNOSIS — G8190 Hemiplegia, unspecified affecting unspecified side: Secondary | ICD-10-CM | POA: Diagnosis present

## 2013-03-07 DIAGNOSIS — E860 Dehydration: Secondary | ICD-10-CM | POA: Diagnosis not present

## 2013-03-07 DIAGNOSIS — D649 Anemia, unspecified: Secondary | ICD-10-CM | POA: Diagnosis present

## 2013-03-07 DIAGNOSIS — G9389 Other specified disorders of brain: Secondary | ICD-10-CM

## 2013-03-07 DIAGNOSIS — I5042 Chronic combined systolic (congestive) and diastolic (congestive) heart failure: Secondary | ICD-10-CM | POA: Diagnosis present

## 2013-03-07 DIAGNOSIS — R079 Chest pain, unspecified: Secondary | ICD-10-CM

## 2013-03-07 DIAGNOSIS — C7931 Secondary malignant neoplasm of brain: Secondary | ICD-10-CM

## 2013-03-07 DIAGNOSIS — N390 Urinary tract infection, site not specified: Secondary | ICD-10-CM | POA: Diagnosis not present

## 2013-03-07 DIAGNOSIS — Z7982 Long term (current) use of aspirin: Secondary | ICD-10-CM

## 2013-03-07 DIAGNOSIS — I509 Heart failure, unspecified: Secondary | ICD-10-CM | POA: Diagnosis present

## 2013-03-07 DIAGNOSIS — C7949 Secondary malignant neoplasm of other parts of nervous system: Secondary | ICD-10-CM

## 2013-03-07 DIAGNOSIS — D6869 Other thrombophilia: Secondary | ICD-10-CM | POA: Diagnosis present

## 2013-03-07 DIAGNOSIS — N179 Acute kidney failure, unspecified: Secondary | ICD-10-CM | POA: Diagnosis present

## 2013-03-07 DIAGNOSIS — J690 Pneumonitis due to inhalation of food and vomit: Secondary | ICD-10-CM

## 2013-03-07 DIAGNOSIS — Z515 Encounter for palliative care: Secondary | ICD-10-CM

## 2013-03-07 DIAGNOSIS — IMO0002 Reserved for concepts with insufficient information to code with codable children: Secondary | ICD-10-CM

## 2013-03-07 DIAGNOSIS — Z720 Tobacco use: Secondary | ICD-10-CM

## 2013-03-07 DIAGNOSIS — I639 Cerebral infarction, unspecified: Secondary | ICD-10-CM

## 2013-03-07 DIAGNOSIS — R531 Weakness: Secondary | ICD-10-CM

## 2013-03-07 DIAGNOSIS — R1310 Dysphagia, unspecified: Secondary | ICD-10-CM

## 2013-03-07 DIAGNOSIS — D696 Thrombocytopenia, unspecified: Secondary | ICD-10-CM | POA: Diagnosis present

## 2013-03-07 DIAGNOSIS — I420 Dilated cardiomyopathy: Secondary | ICD-10-CM

## 2013-03-07 DIAGNOSIS — N183 Chronic kidney disease, stage 3 unspecified: Secondary | ICD-10-CM | POA: Diagnosis present

## 2013-03-07 DIAGNOSIS — Z923 Personal history of irradiation: Secondary | ICD-10-CM

## 2013-03-07 DIAGNOSIS — F17200 Nicotine dependence, unspecified, uncomplicated: Secondary | ICD-10-CM | POA: Diagnosis present

## 2013-03-07 DIAGNOSIS — Z801 Family history of malignant neoplasm of trachea, bronchus and lung: Secondary | ICD-10-CM

## 2013-03-07 DIAGNOSIS — I428 Other cardiomyopathies: Secondary | ICD-10-CM | POA: Diagnosis present

## 2013-03-07 DIAGNOSIS — I6789 Other cerebrovascular disease: Secondary | ICD-10-CM

## 2013-03-07 DIAGNOSIS — F1410 Cocaine abuse, uncomplicated: Secondary | ICD-10-CM

## 2013-03-07 LAB — COMPREHENSIVE METABOLIC PANEL
ALT: 59 U/L — ABNORMAL HIGH (ref 0–53)
AST: 25 U/L (ref 0–37)
Albumin: 3.2 g/dL — ABNORMAL LOW (ref 3.5–5.2)
Alkaline Phosphatase: 49 U/L (ref 39–117)
BUN: 34 mg/dL — ABNORMAL HIGH (ref 6–23)
CALCIUM: 9 mg/dL (ref 8.4–10.5)
CO2: 26 meq/L (ref 19–32)
Chloride: 101 mEq/L (ref 96–112)
Creatinine, Ser: 1.63 mg/dL — ABNORMAL HIGH (ref 0.50–1.35)
GFR calc Af Amer: 49 mL/min — ABNORMAL LOW (ref >90)
GFR calc non Af Amer: 42 mL/min — ABNORMAL LOW (ref >90)
Glucose, Bld: 101 mg/dL — ABNORMAL HIGH (ref 70–99)
Potassium: 4.3 mEq/L (ref 3.7–5.3)
Sodium: 140 mEq/L (ref 137–147)
TOTAL PROTEIN: 6.6 g/dL (ref 6.0–8.3)
Total Bilirubin: <0.2 mg/dL — ABNORMAL LOW (ref 0.3–1.2)

## 2013-03-07 LAB — I-STAT CHEM 8, ED
BUN: 35 mg/dL — ABNORMAL HIGH (ref 6–23)
CHLORIDE: 104 meq/L (ref 96–112)
Calcium, Ion: 1.18 mmol/L (ref 1.13–1.30)
Creatinine, Ser: 1.9 mg/dL — ABNORMAL HIGH (ref 0.50–1.35)
Glucose, Bld: 100 mg/dL — ABNORMAL HIGH (ref 70–99)
HEMATOCRIT: 26 % — AB (ref 39.0–52.0)
Hemoglobin: 8.8 g/dL — ABNORMAL LOW (ref 13.0–17.0)
POTASSIUM: 4.2 meq/L (ref 3.7–5.3)
SODIUM: 140 meq/L (ref 137–147)
TCO2: 25 mmol/L (ref 0–100)

## 2013-03-07 LAB — DIFFERENTIAL
BASOS ABS: 0 10*3/uL (ref 0.0–0.1)
Basophils Relative: 1 % (ref 0–1)
EOS ABS: 0 10*3/uL (ref 0.0–0.7)
EOS PCT: 0 % (ref 0–5)
Lymphocytes Relative: 21 % (ref 12–46)
Lymphs Abs: 0.8 10*3/uL (ref 0.7–4.0)
MONO ABS: 0.6 10*3/uL (ref 0.1–1.0)
Monocytes Relative: 15 % — ABNORMAL HIGH (ref 3–12)
Neutro Abs: 2.6 10*3/uL (ref 1.7–7.7)
Neutrophils Relative %: 64 % (ref 43–77)

## 2013-03-07 LAB — CBC
HEMATOCRIT: 24.5 % — AB (ref 39.0–52.0)
Hemoglobin: 8.1 g/dL — ABNORMAL LOW (ref 13.0–17.0)
MCH: 29.7 pg (ref 26.0–34.0)
MCHC: 33.1 g/dL (ref 30.0–36.0)
MCV: 89.7 fL (ref 78.0–100.0)
PLATELETS: 50 10*3/uL — AB (ref 150–400)
RBC: 2.73 MIL/uL — ABNORMAL LOW (ref 4.22–5.81)
RDW: 16.7 % — ABNORMAL HIGH (ref 11.5–15.5)
WBC: 4 10*3/uL (ref 4.0–10.5)

## 2013-03-07 LAB — RAPID URINE DRUG SCREEN, HOSP PERFORMED
AMPHETAMINES: NOT DETECTED
BARBITURATES: NOT DETECTED
BENZODIAZEPINES: NOT DETECTED
COCAINE: POSITIVE — AB
Opiates: NOT DETECTED
Tetrahydrocannabinol: NOT DETECTED

## 2013-03-07 LAB — I-STAT TROPONIN, ED: Troponin i, poc: 0.28 ng/mL (ref 0.00–0.08)

## 2013-03-07 LAB — URINALYSIS, ROUTINE W REFLEX MICROSCOPIC
BILIRUBIN URINE: NEGATIVE
GLUCOSE, UA: NEGATIVE mg/dL
Hgb urine dipstick: NEGATIVE
KETONES UR: NEGATIVE mg/dL
Leukocytes, UA: NEGATIVE
NITRITE: NEGATIVE
Protein, ur: NEGATIVE mg/dL
Specific Gravity, Urine: 1.021 (ref 1.005–1.030)
Urobilinogen, UA: 1 mg/dL (ref 0.0–1.0)
pH: 6 (ref 5.0–8.0)

## 2013-03-07 LAB — PROTIME-INR
INR: 1.03 (ref 0.00–1.49)
PROTHROMBIN TIME: 13.3 s (ref 11.6–15.2)

## 2013-03-07 LAB — ETHANOL

## 2013-03-07 LAB — APTT: aPTT: 26 seconds (ref 24–37)

## 2013-03-07 NOTE — ED Notes (Signed)
Dr Zenia Resides given a copy of troponin results 0.28

## 2013-03-07 NOTE — ED Provider Notes (Addendum)
CSN: [id number]     Arrival date & time 03/07/13  2131 History   First MD Initiated Contact with Patient 03/07/13 2136     Chief Complaint  Patient presents with  . Code Stroke     (Consider location/radiation/quality/duration/timing/severity/associated sxs/prior Treatment) The history is provided by the patient.   patient here after having acute onset of left-sided weakness which began approximately 1 hour prior to arrival. Patient able to lift up his left arm and left leg. He noted no trouble swallowing. He had a severe headache. No vomiting. Symptoms have been worsening in nothing makes them better. No treatment used prior to arrival. EMS was contacted and patient was transported here.  Past Medical History  Diagnosis Date  . Medical history non-contributory   . Hypertension   . Lung cancer   . Hx of radiation therapy 01/19/13    left parietal, lt cerebellar   Past Surgical History  Procedure Laterality Date  . No past surgeries     Family History  Problem Relation Age of Onset  . Heart Problems Mother   . Cancer Mother 41    lung cancer  . Cancer Father 64    pancreas cancer   History  Substance Use Topics  . Smoking status: Current Every Day Smoker -- 0.50 packs/day for 25 years    Types: Cigarettes  . Smokeless tobacco: Never Used  . Alcohol Use: Yes     Comment: occasionally    Review of Systems  All other systems reviewed and are negative.      Allergies  Review of patient's allergies indicates no known allergies.  Home Medications   Current Outpatient Rx  Name  Route  Sig  Dispense  Refill  . amLODipine (NORVASC) 10 MG tablet   Oral   Take 1 tablet (10 mg total) by mouth daily.   30 tablet   1   . aspirin EC 325 MG EC tablet   Oral   Take 1 tablet (325 mg total) by mouth daily.   30 tablet   0   . dexamethasone (DECADRON) 4 MG tablet   Oral   Take 1 tablet (4 mg total) by mouth every 6 (six) hours.   120 tablet   1   . dexamethasone  (DECADRON) 4 MG tablet      4 mg by mouth twice a day the day before, day of and day after the chemotherapy every 3 weeks   40 tablet   1   . folic acid (FOLVITE) 1 MG tablet   Oral   Take 1 tablet (1 mg total) by mouth daily.   30 tablet   3   . hydrALAZINE (APRESOLINE) 25 MG tablet   Oral   Take 1 tablet (25 mg total) by mouth every 8 (eight) hours.   90 tablet   1   . levETIRAcetam (KEPPRA) 1000 MG tablet   Oral   Take 1 tablet (1,000 mg total) by mouth 2 (two) times daily.   60 tablet   1   . lisinopril (PRINIVIL,ZESTRIL) 5 MG tablet   Oral   Take 1 tablet (5 mg total) by mouth daily.   30 tablet   1   . oxyCODONE-acetaminophen (PERCOCET/ROXICET) 5-325 MG per tablet   Oral   Take 1 tablet by mouth every 4 (four) hours as needed for moderate pain or severe pain.   15 tablet   0   . pantoprazole (PROTONIX) 40 MG tablet   Oral  Take 1 tablet (40 mg total) by mouth daily.   30 tablet   1   . prochlorperazine (COMPAZINE) 10 MG tablet   Oral   Take 1 tablet (10 mg total) by mouth every 6 (six) hours as needed for nausea or vomiting.   60 tablet   0   . zolpidem (AMBIEN) 10 MG tablet   Oral   Take 10 mg by mouth at bedtime as needed for sleep. With 1 refill called rx to [facility] [phone]          BP 132/85  Pulse 71  Temp(Src) 97.8 F (36.6 C) (Oral)  Resp 16  SpO2 100% Physical Exam  Nursing note and vitals reviewed. Constitutional: He is oriented to person, place, and time. He appears well-developed and well-nourished.  Non-toxic appearance. No distress.  HENT:  Head: Normocephalic and atraumatic.  Eyes: Conjunctivae, EOM and lids are normal. Pupils are equal, round, and reactive to light.  Neck: Normal range of motion. Neck supple. No tracheal deviation present. No mass present.  Cardiovascular: Normal rate, regular rhythm and normal heart sounds.  Exam reveals no gallop.   No murmur heard. Pulmonary/Chest: Effort normal and breath sounds  normal. No stridor. No respiratory distress. He has no decreased breath sounds. He has no wheezes. He has no rhonchi. He has no rales.  Abdominal: Soft. Normal appearance and bowel sounds are normal. He exhibits no distension. There is no tenderness. There is no rebound and no CVA tenderness.  Musculoskeletal: Normal range of motion. He exhibits no edema and no tenderness.  Neurological: He is alert and oriented to person, place, and time. A cranial nerve deficit and sensory deficit is present. Coordination abnormal. GCS eye subscore is 4. GCS verbal subscore is 5. GCS motor subscore is 6.  Right upper right lower extremity strength normal with negative Romberg. Left upper extremity strength 1/5. Left lower extremity strength is 3/5.  Skin: Skin is warm and dry. No abrasion and no rash noted.  Psychiatric: He has a normal mood and affect. His speech is normal and behavior is normal.    ED Course  Procedures (including critical care time) Labs Review Labs Reviewed  CBC - Abnormal; Notable for the following:    RBC 2.73 (*)    Hemoglobin 8.1 (*)    HCT 24.5 (*)    RDW 16.7 (*)    All other components within normal limits  DIFFERENTIAL - Abnormal; Notable for the following:    Monocytes Relative 15 (*)    All other components within normal limits  I-STAT CHEM 8, ED - Abnormal; Notable for the following:    BUN 35 (*)    Creatinine, Ser 1.90 (*)    Glucose, Bld 100 (*)    Hemoglobin 8.8 (*)    HCT 26.0 (*)    All other components within normal limits  I-STAT TROPOININ, ED - Abnormal; Notable for the following:    Troponin i, poc 0.28 (*)    All other components within normal limits  PROTIME-INR  APTT  ETHANOL  COMPREHENSIVE METABOLIC PANEL  URINE RAPID DRUG SCREEN (HOSP PERFORMED)  URINALYSIS, ROUTINE W REFLEX MICROSCOPIC  I-STAT TROPOININ, ED   Imaging Review Ct Head Wo Contrast  03/07/2013   CLINICAL DATA:  Code stroke, left face and extremity weakness. History of metastatic  disease to the brain treated with radiation.  EXAM: CT HEAD WITHOUT CONTRAST  TECHNIQUE: Contiguous axial images were obtained from the base of the skull through the vertex without  contrast.  COMPARISON:  NM PET IMAGE INITIAL (PI) SKULL BASE TO THIGH dated 01/26/2013; MR HEAD WO/W CM dated 01/13/2013; MR HEAD WO/W CM dated 01/03/2013; CT HEAD W/O CM dated 01/02/2013  FINDINGS: Previous areas of vasogenic edema related to intracranial metastatic disease have largely resolved. Slight residual hypodensity in the left parasagittal posterior frontal cortex representing the involuting larger metastasis. No acute stroke, acute hemorrhage, significant mass lesion, hydrocephalus, or extra-axial fluid. Generalized atrophy. Small vessel disease. No fracture or osseous destructive lesion. Clear sinuses. No mastoid fluid.  IMPRESSION: Atrophy and small vessel disease.  No acute intracranial findings.  Critical Value/emergent results were called by telephone at the time of interpretation on 03/07/2013 at 9:52 PM to Stroke neurologist who verbally acknowledged these results.   Electronically Signed   By: Rolla Flatten M.D.   On: 03/07/2013 20:56     EKG Interpretation None        Date: 03/07/2013  Rate: 72  Rhythm: normal sinus rhythm  QRS Axis: normal  Intervals: normal  ST/T Wave abnormalities: nonspecific ST changes  Conduction Disutrbances:none  Narrative Interpretation:   Old EKG Reviewed: none available   MDM   Final diagnoses:  None    Patient medical stroke prior to arrival here and has been seen by the neurologist. Due to his history of intracranial mass he is not a  TPA candidate. Patient's troponin elevation noted as well. [facility] and followed by the stroke service.    Leota Jacobsen, MD 03/07/13 [id number]  Leota Jacobsen, MD 03/07/13 [phone]

## 2013-03-07 NOTE — Consult Note (Signed)
Neurology Consultation Reason for Consult: Left-sided weakness Referring Physician: Zenia Resides, a  CC: Left-sided weakness  History is obtained from: Patient  HPI: Carlos Beasley is a 68 y.o. male with a history o fmetastatic lung cancer undergoing palliative chemotherapy who began having symptomsat approximately 9 PM at which time he began having left-sided weakness arm greater than leg. He did not have any changes in consciousness or abnormal movements before this weakness. EMS was called and activated a code stroke en route. 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  He had a CT here which shows improvement, though there is still a hypodensity where one of the masses is known to be based on his previous MRI.  Of note, he did have a recent episode of  seizure and was started on Keppra at that time.  He notes frequent nosebleeds as well as frequent coughing up blood.  LKW: 9 PM tpa given?: no, known intracranial metastasis NIHSS: 9   ROS: A 14 point ROS was performed and is negative except as noted in the HPI.  Past Medical History  Diagnosis Date  . Medical history non-contributory   . Hypertension   . Lung cancer   . Hx of radiation therapy 01/19/13    left parietal, lt cerebellar    Family History: No hx stroke  Social History: Tob: current smoker  Exam: Current vital signs: BP 139/78  Pulse 74  Temp(Src) 97.8 F (36.6 C) (Oral)  Resp 15  SpO2 97% Vital signs in last 24 hours: Temp:  [97.8 F (36.6 C)] 97.8 F (36.6 C) (03/07 2154) Pulse Rate:  [74] 74 (03/07 2154) Resp:  [15] 15 (03/07 2154) BP: (139)/(78) 139/78 mmHg (03/07 2154) SpO2:  [97 %] 97 % (03/07 2154)  General: in bed, NAD CV: RRR Mental Status: Patient is awake, alert, oriented to person, place, month, year, and situation. Immediate and remote memory are intact. Patient is able to give a clear and coherent history. No signs of  aphasia or neglect Cranial Nerves: II: Visual Fields are full. Pupils are equal, round, and reactive to light.  Discs are difficult to visualize. There is no extinction to double simultaneous stimulation and visual fields. III,IV, VI: EOMI without ptosis or diploplia.  V: Facial sensation is decreased on the left VII: Facial movement is notable for left facial weakness VIII: hearing is intact to voice X: Uvula elevates symmetrically XI: Shoulder shrug is symmetric. XII: tongue is midline without atrophy or fasciculations.  Motor: Tone is normal. Bulk is normal. 5/5 strength was present on the right side, the left side he has 2/5 weakness of the left arm and 4 minus/5 weakness of the left leg. Sensory: Absent sensation of the left arm, decreased in the left leg Deep Tendon Reflexes: 2+ and symmetric in the biceps and patellae.  Cerebellar: FNF intact on right, unable to perform on left Gait: Did not perform secondary to patient safety concerns   I have reviewed labs in epic and the results pertinent to this consultation are: CMP mildly elevated creatinine  I have reviewed the images obtained: CT head-no acute findings, but there is a mild hypodensity in area of known metastasis  Impression: 68 year old male with intracranial metastasis who presents with what is likely a subcortical infarct. He is not a candidate for TPA therapy given his known intracranial metastasis. At this time, he will need to be admitted for physical therapy stratification of risk factors.  With his hemoptysis, I think this would need to be taken consideration prior to beginning antiplatelet therapy.  Recommendations: 1. HgbA1c, fasting lipid panel 2. MRI, MRA  of the brain without contrast 3. Frequent neuro checks 4. Echocardiogram if the patient would be an anticoagulation candidate 5. Carotid dopplers if the patient would be a surgical candidate if carotid stenosis was found 6. Prophylactic therapy-I would  discuss with pulmonology the risks given the hemoptysis of starting antiplatelet therapy. 7. Telemetry monitoring 8. PT consult, OT consult, Speech consult    Roland Rack, MD [facility] [phone]  If 7pm- 7am, please page neurology on call at [phone].

## 2013-03-07 NOTE — ED Notes (Signed)
Patient LSN [id number]. Patient has left sided weakness and slurred speech. Witnessed by family. Hx of brain mass that was recently treated. A/o x4

## 2013-03-08 ENCOUNTER — Inpatient Hospital Stay (HOSPITAL_COMMUNITY): Payer: Medicare Other

## 2013-03-08 DIAGNOSIS — C3490 Malignant neoplasm of unspecified part of unspecified bronchus or lung: Secondary | ICD-10-CM

## 2013-03-08 DIAGNOSIS — I6789 Other cerebrovascular disease: Secondary | ICD-10-CM

## 2013-03-08 DIAGNOSIS — I6350 Cerebral infarction due to unspecified occlusion or stenosis of unspecified cerebral artery: Principal | ICD-10-CM

## 2013-03-08 DIAGNOSIS — F1410 Cocaine abuse, uncomplicated: Secondary | ICD-10-CM

## 2013-03-08 DIAGNOSIS — I428 Other cardiomyopathies: Secondary | ICD-10-CM

## 2013-03-08 DIAGNOSIS — F17200 Nicotine dependence, unspecified, uncomplicated: Secondary | ICD-10-CM

## 2013-03-08 DIAGNOSIS — R079 Chest pain, unspecified: Secondary | ICD-10-CM

## 2013-03-08 DIAGNOSIS — I10 Essential (primary) hypertension: Secondary | ICD-10-CM

## 2013-03-08 LAB — TROPONIN I
TROPONIN I: 0.44 ng/mL — AB (ref <0.30)
TROPONIN I: 0.41 ng/mL — AB (ref <0.30)
Troponin I: 0.41 ng/mL (ref <0.30)

## 2013-03-08 LAB — LIPID PANEL
Cholesterol: 231 mg/dL — ABNORMAL HIGH (ref 0–200)
HDL: 147 mg/dL (ref >39)
LDL Cholesterol: 71 mg/dL (ref 0–99)
Total CHOL/HDL Ratio: 1.6 RATIO
Triglycerides: 67 mg/dL (ref <150)
VLDL: 13 mg/dL (ref 0–40)

## 2013-03-08 MED ORDER — LEVETIRACETAM 500 MG PO TABS
1000.0000 mg | ORAL_TABLET | Freq: Two times a day (BID) | ORAL | Status: DC
  Administered 2013-03-08 – 2013-03-15 (×16): 1000 mg via ORAL
  Filled 2013-03-08 (×17): qty 2

## 2013-03-08 MED ORDER — HYDRALAZINE HCL 25 MG PO TABS
25.0000 mg | ORAL_TABLET | Freq: Three times a day (TID) | ORAL | Status: DC
  Administered 2013-03-08 – 2013-03-15 (×18): 25 mg via ORAL
  Filled 2013-03-08 (×25): qty 1

## 2013-03-08 MED ORDER — LISINOPRIL 5 MG PO TABS
5.0000 mg | ORAL_TABLET | Freq: Every day | ORAL | Status: DC
  Administered 2013-03-08 – 2013-03-13 (×6): 5 mg via ORAL
  Filled 2013-03-08 (×7): qty 1

## 2013-03-08 MED ORDER — SODIUM CHLORIDE 0.9 % IV SOLN
INTRAVENOUS | Status: DC
  Administered 2013-03-08 – 2013-03-15 (×5): via INTRAVENOUS

## 2013-03-08 MED ORDER — PANTOPRAZOLE SODIUM 40 MG PO TBEC
40.0000 mg | DELAYED_RELEASE_TABLET | Freq: Every day | ORAL | Status: DC
  Administered 2013-03-08 – 2013-03-15 (×8): 40 mg via ORAL
  Filled 2013-03-08 (×6): qty 1

## 2013-03-08 MED ORDER — ASPIRIN 325 MG PO TABS
325.0000 mg | ORAL_TABLET | Freq: Every day | ORAL | Status: DC
  Administered 2013-03-08 – 2013-03-15 (×8): 325 mg via ORAL
  Filled 2013-03-08 (×9): qty 1

## 2013-03-08 MED ORDER — AMLODIPINE BESYLATE 10 MG PO TABS
10.0000 mg | ORAL_TABLET | Freq: Every day | ORAL | Status: DC
  Administered 2013-03-08 – 2013-03-15 (×8): 10 mg via ORAL
  Filled 2013-03-08 (×8): qty 1

## 2013-03-08 MED ORDER — PROCHLORPERAZINE MALEATE 10 MG PO TABS
10.0000 mg | ORAL_TABLET | Freq: Four times a day (QID) | ORAL | Status: DC | PRN
  Filled 2013-03-08: qty 1

## 2013-03-08 MED ORDER — FOLIC ACID 1 MG PO TABS
1.0000 mg | ORAL_TABLET | Freq: Every day | ORAL | Status: DC
  Administered 2013-03-08 – 2013-03-15 (×8): 1 mg via ORAL
  Filled 2013-03-08 (×9): qty 1

## 2013-03-08 MED ORDER — STROKE: EARLY STAGES OF RECOVERY BOOK
Freq: Once | Status: AC
  Administered 2013-03-08: 08:00:00
  Filled 2013-03-08: qty 1

## 2013-03-08 MED ORDER — SENNOSIDES-DOCUSATE SODIUM 8.6-50 MG PO TABS
1.0000 | ORAL_TABLET | Freq: Every evening | ORAL | Status: DC | PRN
  Filled 2013-03-08 (×2): qty 1

## 2013-03-08 NOTE — Progress Notes (Signed)
Stroke Team Progress Note  HISTORY  68 y.o. male with a history of metastatic lung cancer undergoing palliative chemotherapy who began having symptomsat approximately 9 PM day of admission at which time he began having left-sided weakness arm greater than leg. He did not have any changes in consciousness or abnormal movements before this weakness. EMS was called and activated a code stroke en route. 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  He had a CT here which shows improvement, though there is still a hypodensity where one of the masses is known to be based on his previous MRI.  Of note, he did have a recent episode of seizure and was started on Keppra at that time.     SUBJECTIVE No change in neurological examination.  OBJECTIVE Most recent Vital Signs: Filed Vitals:   03/07/13 2332 03/08/13 0030 03/08/13 0200 03/08/13 0345  BP: 146/77 148/88 127/80 127/86  Pulse: 77 77 92 68  Temp:  98.2 F (36.8 C)  98.2 F (36.8 C)  TempSrc:  Oral  Oral  Resp: 13 16  17   Height:  6\' 2"  (1.88 m)    Weight:  74.844 kg (165 lb)    SpO2: 100% 100%  100%   CBG (last 3)  No results found for this basename: GLUCAP,  in the last 72 hours  IV Fluid Intake:   . sodium chloride 100 mL/hr at 03/08/13 1016    MEDICATIONS  . amLODipine  10 mg Oral Daily  . aspirin  325 mg Oral Daily  . folic acid  1 mg Oral Daily  . hydrALAZINE  25 mg Oral 3 times per day  . levETIRAcetam  1,000 mg Oral BID  . lisinopril  5 mg Oral Daily  . pantoprazole  40 mg Oral Daily   PRN:  prochlorperazine, senna-docusate  Diet:  Cardiac  Activity:  Up with assistance DVT Prophylaxis:  SCDs  CLINICALLY SIGNIFICANT STUDIES Basic Metabolic Panel:  Recent Labs Lab 03/03/13 0806 03/07/13 2136 03/07/13 2141  NA 145 140 140  K 4.3 4.3 4.2  CL  --  101 104  CO2 29 26  --   GLUCOSE 137 101* 100*  BUN 24.7 34* 35*  CREATININE 1.2 1.63* 1.90*   CALCIUM 9.2 9.0  --    Liver Function Tests:  Recent Labs Lab 03/03/13 0806 03/07/13 2136  AST 30 25  ALT 90* 59*  ALKPHOS 68 49  BILITOT 0.28 <0.2*  PROT 6.8 6.6  ALBUMIN 3.2* 3.2*   CBC:  Recent Labs Lab 03/03/13 0806 03/07/13 2136 03/07/13 2141  WBC 2.0* 4.0  --   NEUTROABS 1.1* 2.6  --   HGB 9.5* 8.1* 8.8*  HCT 29.2* 24.5* 26.0*  MCV 91.6 89.7  --   PLT 75* 50*  --    Coagulation:  Recent Labs Lab 03/07/13 2136  LABPROT 13.3  INR 1.03   Cardiac Enzymes:  Recent Labs Lab 03/08/13 0530  TROPONINI 0.44*   Urinalysis:  Recent Labs Lab 03/07/13 2247  COLORURINE YELLOW  LABSPEC 1.021  PHURINE 6.0  GLUCOSEU NEGATIVE  HGBUR NEGATIVE  BILIRUBINUR NEGATIVE  KETONESUR NEGATIVE  PROTEINUR NEGATIVE  UROBILINOGEN 1.0  NITRITE NEGATIVE  LEUKOCYTESUR NEGATIVE   Lipid Panel    Component Value Date/Time   CHOL 231* 03/08/2013 0530   TRIG 67 03/08/2013 0530   HDL 147 03/08/2013 0530   CHOLHDL 1.6 03/08/2013 0530   VLDL 13 03/08/2013 0530  LDLCALC 71 03/08/2013 0530   HgbA1C  Lab Results  Component Value Date   HGBA1C 6.1* 01/02/2013    Urine Drug Screen:     Component Value Date/Time   LABOPIA NONE DETECTED 03/07/2013 2247   COCAINSCRNUR POSITIVE* 03/07/2013 2247   LABBENZ NONE DETECTED 03/07/2013 2247   AMPHETMU NONE DETECTED 03/07/2013 2247   THCU NONE DETECTED 03/07/2013 2247   LABBARB NONE DETECTED 03/07/2013 2247    Alcohol Level:  Recent Labs Lab 03/07/13 2136  ETH <11    Ct Head Wo Contrast  03/07/2013   CLINICAL DATA:  Code stroke, left face and extremity weakness. 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  EXAM: CT HEAD WITHOUT CONTRAST  TECHNIQUE: Contiguous axial images were obtained from the base of the skull through the vertex without contrast.  COMPARISON:  NM PET IMAGE INITIAL (PI) SKULL BASE TO THIGH dated 01/26/2013; MR HEAD WO/W CM dated 01/13/2013; MR HEAD WO/W CM dated 01/03/2013; CT HEAD W/O CM dated 01/02/2013  FINDINGS:  Previous areas of vasogenic edema related to intracranial metastatic disease have largely resolved. Slight residual hypodensity in the left parasagittal posterior frontal cortex representing the involuting larger metastasis. No acute stroke, acute hemorrhage, significant mass lesion, hydrocephalus, or extra-axial fluid. Generalized atrophy. Small vessel disease. No fracture or osseous destructive lesion. Clear sinuses. No mastoid fluid.  IMPRESSION: Atrophy and small vessel disease.  No acute intracranial findings.  Critical Value/emergent results were called by telephone at the time of interpretation on 03/07/2013 at 9:52 PM to Stroke neurologist who verbally acknowledged these results.   Electronically Signed   By: Rolla Flatten M.D.   On: 03/07/2013 20:56    CT of the brain  Atrophy and small vessel disease. No acute intracranial findings.  MRI of the brain    MRA of the brain    2D Echocardiogram    Carotid Doppler    CXR    EKG  normal EKG, normal sinus rhythm, unchanged from previous tracings. For complete results please see formal report.   Therapy Recommendations pending  Physical Exam   Mental Status:  Patient is awake, alert, oriented to person, place, month, year, and situation.  Immediate and remote memory are intact.  Patient is able to give a clear and coherent history.  No signs of aphasia or neglect  Cranial Nerves:  II: Visual Fields are full. Pupils are equal, round, and reactive to light. Discs are difficult to visualize. There is no extinction to double simultaneous stimulation and visual fields.  III,IV, VI: EOMI without ptosis or diploplia.  V: Facial sensation is decreased on the left  VII: Facial movement is notable for left facial weakness  VIII: hearing is intact to voice  X: Uvula elevates symmetrically  XI: Shoulder shrug is symmetric.  XII: tongue is midline without atrophy or fasciculations.  Motor:  Tone is normal. Bulk is normal. 5/5 strength was  present on the right side, the left side he has 2/5 weakness of the left arm and 4 minus/5 weakness of the left leg.  Sensory:  Absent sensation of the left arm, decreased in the left leg  Deep Tendon Reflexes:  2+ and symmetric in the biceps and patellae.  Cerebellar:  FNF intact on right, unable to perform on left  Gait:  Did not perform secondary to patient safety concerns   ASSESSMENT Mr. Carlos Beasley is a 68 y.o. male with intracranial metastasis who presents with what is likely a subcortical infarct. He  is not a candidate for TPA therapy given his known intracranial metastasis. Utox cocaine positive.    Lung CA with ments  LDL 71 HbA1c Pending  Hospital day # 1  TREATMENT/PLAN  HbA1c  MRI/ MRA  Echo 2d  Carotids  Tele  Pt/ot  ASA 325, pt does have hypercoagulable state due to hx of CA  03/08/2013 11:06 AM  I have personally obtained a history, examined the patient, evaluated imaging results, and formulated the assessment and plan of care. I agree with the above.

## 2013-03-08 NOTE — Progress Notes (Signed)
Patient admitted this AM for left sided weakness starting yesterday. I have evaluated him today. He is attempting to feed himself and has coordination issues with food stuck on left cheek- unable to wipe it off.   A/P  Left sided weakness- CVA - f/u MRI reveals multifocal acute/subacute infarcts in right fronto parietal and b/l cerebellar areas -  PT eval - carotid ultrasound negative for stenosis - ECHO negative for thrombus - left intracranial ICA with mild to mod narrowing  - LDL 71 but choles 231- will let neuro decide if he needs a statin  Lung Cancer- NSC - cont treatment per onc- palliative chemo and radiation  - previous brain mets show decreased vasogenic edema  Positive Troponin - suspected to be troponin leak - no further work up per Dr Einar Gip as pt is not a candidate for strong anticoagulants due to his cancerous masses  ARF - cont to hydrate to see if this is prerenal  Chronic systolic and diastolic CHF - compensated  Cocaine abuse  Tobacco abuse  Seizure disorder - cont Keppra  Debbe Odea, MD

## 2013-03-08 NOTE — Progress Notes (Signed)
  Echocardiogram 2D Echocardiogram has been performed.  Nirav Sweda FRANCES 03/08/2013, 12:05 PM

## 2013-03-08 NOTE — Consult Note (Signed)
CARDIOLOGY CONSULT NOTE  Patient ID: NHAT HEARNE MRN: [id number] DOB/AGE: 07/23/1945 68 y.o.  Admit date: 03/07/2013 Referring Physician  Jana Hakim, MD Primary Physician:  [name], MD Reason for Consultation  Abnormal S. Troponins  HPI: patient is a 68 year old African-American male with history of stage IV lung cancer, who was admitted with sudden onset of left-sided weakness that started yesterday.  Patient has no polysubstance abuse, cardiomyopathy with known severe LV systolic dysfunction by echocardiogram done 2 months ago, denied any chest pain, shortness of breath, PND or orthopnea.  Denied any symptoms to suggest claudication.  He was positive for cocaine by rapid drug screen.  Abnormal serum troponin I was asked to see the patient.  He does have moderate chronic renal insufficiency.patient has history of alcohol, cigarette, cocaine use.  Past Medical History  Diagnosis Date  . Hypertension   . Lung cancer   . Hx of radiation therapy 01/19/13    left parietal, lt cerebellar     Past Surgical History  Procedure Laterality Date  . No past surgeries       Family History  Problem Relation Age of Onset  . Heart Problems Mother   . Cancer Mother 63    lung cancer  . Cancer Father 67    pancreas cancer     Social History: History   Social History  . Marital Status: Single    Spouse Name: N/A    Number of Children: N/A  . Years of Education: N/A   Occupational History  . Not on file.   Social History Main Topics  . Smoking status: Current Every Day Smoker -- 0.50 packs/day for 25 years    Types: Cigarettes  . Smokeless tobacco: Never Used  . Alcohol Use: Yes     Comment: occasionally  . Drug Use: Yes    Special: Cocaine     Comment: 07/11/12- "twice in past 2 weeks"  . Sexual Activity: Yes    Birth Control/ Protection: None   Other Topics Concern  . Not on file   Social History Narrative  . No narrative on file     Prescriptions prior  to admission  Medication Sig Dispense Refill  . amLODipine (NORVASC) 10 MG tablet Take 1 tablet (10 mg total) by mouth daily.  30 tablet  1  . aspirin EC 325 MG EC tablet Take 1 tablet (325 mg total) by mouth daily.  30 tablet  0  . folic acid (FOLVITE) 1 MG tablet Take 1 tablet (1 mg total) by mouth daily.  30 tablet  3  . hydrALAZINE (APRESOLINE) 25 MG tablet Take 1 tablet (25 mg total) by mouth every 8 (eight) hours.  90 tablet  1  . levETIRAcetam (KEPPRA) 1000 MG tablet Take 1 tablet (1,000 mg total) by mouth 2 (two) times daily.  60 tablet  1  . lisinopril (PRINIVIL,ZESTRIL) 5 MG tablet Take 1 tablet (5 mg total) by mouth daily.  30 tablet  1  . oxyCODONE-acetaminophen (PERCOCET/ROXICET) 5-325 MG per tablet Take 1 tablet by mouth every 4 (four) hours as needed for severe pain.      . pantoprazole (PROTONIX) 40 MG tablet Take 1 tablet (40 mg total) by mouth daily.  30 tablet  1  . prochlorperazine (COMPAZINE) 10 MG tablet Take 1 tablet (10 mg total) by mouth every 6 (six) hours as needed for nausea or vomiting.  60 tablet  0  . zolpidem (AMBIEN) 10 MG tablet Take 10 mg by  mouth at bedtime as needed for sleep. With 1 refill called rx to [facility] [phone]      . dexamethasone (DECADRON) 4 MG tablet Take 4 mg by mouth 4 (four) times daily.        Scheduled Meds: . amLODipine  10 mg Oral Daily  . aspirin  325 mg Oral Daily  . folic acid  1 mg Oral Daily  . hydrALAZINE  25 mg Oral 3 times per day  . levETIRAcetam  1,000 mg Oral BID  . lisinopril  5 mg Oral Daily  . pantoprazole  40 mg Oral Daily   Continuous Infusions: . sodium chloride 100 mL/hr at 03/08/13 1016   PRN Meds:.prochlorperazine, senna-docusate  ROS: General: no fevers/chills/night sweats Eyes: no blurry vision, diplopia, or amaurosis ENT: no sore throat or hearing loss Resp: no cough, wheezing, or hemoptysis CV: no edema or palpitations GI: no abdominal pain, nausea, vomiting, diarrhea, or constipation GU: no  dysuria, frequency, or hematuria Skin: no rash Heme: no bleeding, DVT, or easy bruising Endo: no polydipsia or polyuria    Physical Exam: Blood pressure 147/95, pulse 69, temperature 98.2 F (36.8 C), temperature source Oral, resp. rate 17, height 6\' 2"  (1.88 m), weight 74.844 kg (165 lb), SpO2 100.00%.   General appearance: alert, fatigued, no distress and slowed mentation Neck: no carotid bruit, no JVD and supple, symmetrical, trachea midline Lungs: clear to auscultation bilaterally Heart: regular rate and rhythm, S1, S2 normal, no murmur, click, rub or gallop Abdomen: soft, non-tender; bowel sounds normal; no masses,  no organomegaly Extremities: extremities normal, atraumatic, no cyanosis or edema Neurologic:patient is alert, oriented 3, able to give a history himself, has close left hemiparesis.  Gait not tested. Vascular exam: Normal  Labs:   Lab Results  Component Value Date   WBC 4.0 03/07/2013   HGB 8.8* 03/07/2013   HCT 26.0* 03/07/2013   MCV 89.7 03/07/2013   PLT 50* 03/07/2013    Recent Labs Lab 03/07/13 2136 03/07/13 2141  NA 140 140  K 4.3 4.2  CL 101 104  CO2 26  --   BUN 34* 35*  CREATININE 1.63* 1.90*  CALCIUM 9.0  --   PROT 6.6  --   BILITOT <0.2*  --   ALKPHOS 49  --   ALT 59*  --   AST 25  --   GLUCOSE 101* 100*   Lab Results  Component Value Date   TROPONINI 0.44* 03/08/2013    Lipid Panel     Component Value Date/Time   CHOL 231* 03/08/2013 0530   TRIG 67 03/08/2013 0530   HDL 147 03/08/2013 0530   CHOLHDL 1.6 03/08/2013 0530   VLDL 13 03/08/2013 0530   LDLCALC 71 03/08/2013 0530    EKG: normal EKG, normal sinus rhythm, unchanged from previous tracings.    Radiology: Ct Head Wo Contrast  03/07/2013   CLINICAL DATA:  Code stroke, left face and extremity weakness. 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  EXAM: CT HEAD WITHOUT CONTRAST  TECHNIQUE: Contiguous axial images were obtained from the base of the skull through the  vertex without contrast.  COMPARISON:  NM PET IMAGE INITIAL (PI) SKULL BASE TO THIGH dated 01/26/2013; MR HEAD WO/W CM dated 01/13/2013; MR HEAD WO/W CM dated 01/03/2013; CT HEAD W/O CM dated 01/02/2013  FINDINGS: Previous areas of vasogenic edema related to intracranial metastatic disease have largely resolved. Slight residual hypodensity in the left parasagittal posterior frontal cortex representing the involuting larger  metastasis. No acute stroke, acute hemorrhage, significant mass lesion, hydrocephalus, or extra-axial fluid. Generalized atrophy. Small vessel disease. No fracture or osseous destructive lesion. Clear sinuses. No mastoid fluid.  IMPRESSION: Atrophy and small vessel disease.  No acute intracranial findings.  Critical Value/emergent results were called by telephone at the time of interpretation on 03/07/2013 at 9:52 PM to Stroke neurologist who verbally acknowledged these results.   Electronically Signed   By: Rolla Flatten M.D.   On: 03/07/2013 20:56      ASSESSMENT AND PLAN:  1.  Positive cardiac markers for myocardial injury, serum troponin very minimally elevated, in a patient with chronic renal insufficiency, probable recent stroke or due to mass effect from metastasis of lung cancer to the brain .  Type II cardiac injury/troponin leak. 2.  Cardiomyopathy with severe LV systolic dysfunction,  EF 30-35%, normal EKG, without any chest pain or clinical evidence of congestive heart failure.  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  Recommendation: Patient not a candidate for any cardiac workup.  Essentially asymptomatic without evidence of congestive heart failure or angina pectoris.  Given his stage IV lung cancer, ongoing polysubstance use, I am not sure any therapy would improve his long-term outcome with regard to cardiovascular disease.  Please call if questions.   Unless patient has chest pain or shortness of breath, will do cardiac evaluation for patient's  comfort.  Laverda Page, MD 03/08/2013, 1:38 PM [facility] [place] Pager: [phone] Office: [phone] If no answer Cell [phone]

## 2013-03-08 NOTE — Progress Notes (Signed)
VASCULAR LAB PRELIMINARY  PRELIMINARY  PRELIMINARY  PRELIMINARY  Carotid duplex  completed.    Preliminary report:  Bilateral:  1-39% ICA stenosis.  Vertebral artery flow is antegrade.      Dilana Mcphie, RVT 03/08/2013, 12:39 PM

## 2013-03-08 NOTE — H&P (Signed)
[facility] History and Physical  [name] QIH:[id number] DOB: Jan 24, 1945 DOA: 03/07/2013  Referring physician:  EDP PCP: [name], MD  Specialists:   Chief Complaint:  Left sided Weakness  HPI: Carlos Beasley is a 68 y.o. male with Metastatic Lung [place] Lung Cancer diagnosed in 01/2013  on Palliative Chemo Rx and recent Radiation Rx to the Brain who presents to the ED with complaints of sudden onset of Left sided weakness.  His symptoms started at about 9 pm.  He was seen in the ED as a Code Stroke and ws seen by Neurology Dr. Leonel Ramsay.  A CT scan of the Head was done and revealed  NO acute findings, and improvement in the vasogenic edema surrounding the metastasis of the left parasagital. posterior cortex.    He was referred for admission due to persistence of his left upper arm weakness with a diagnosis of a CVA.   He denies headache, chest pain and SOB.      Review of Systems:  Constitutional: No Weight Loss, No Weight Gain, Night Sweats, Fevers, Chills, Fatigue, or Generalized Weakness HEENT: No Headaches, Difficulty Swallowing,Tooth/Dental Problems,Sore Throat,  No Sneezing, Rhinitis, Ear Ache, Nasal Congestion, or Post Nasal Drip,  Cardio-vascular:  No Chest pain, Orthopnea, PND, Edema in lower extremities, Anasarca, Dizziness, Palpitations  Resp: No Dyspnea, No DOE, No Productive Cough, No Non-Productive Cough, No Hemoptysis, No Change in Color of Mucus,  No Wheezing.    GI: No Heartburn, Indigestion, Abdominal Pain, Nausea, Vomiting, Diarrhea, Change in Bowel Habits,  Loss of Appetite  GU: No Dysuria, Change in Color of Urine, No Urgency or Frequency.  No flank pain.  Musculoskeletal: No Joint Pain or Swelling.  No Decreased Range of Motion. No Back Pain.  Neurologic: No Syncope, No Seizures, + Weakness Left Side,  Paresthesia, Vision Disturbance or Loss, No Diplopia, No Vertigo, No Difficulty Walking,  SKIN:   No Rash or Lesions. Psych: No Change in Mood or  Affect. No Depression or Anxiety. No Memory loss. No Confusion or Hallucinations   Past Medical History  Diagnosis Date  . Hypertension   . Lung cancer   . Hx of radiation therapy 01/19/13    left parietal, lt cerebellar      Past Surgical History  Procedure Laterality Date  . No past surgeries         Prior to Admission medications   Medication Sig Start Date End Date Taking? Authorizing Provider  amLODipine (NORVASC) 10 MG tablet Take 1 tablet (10 mg total) by mouth daily. 01/09/13  Yes Estela Leonie Green, MD  aspirin EC 325 MG EC tablet Take 1 tablet (325 mg total) by mouth daily. 01/09/13  Yes Estela Leonie Green, MD  folic acid (FOLVITE) 1 MG tablet Take 1 tablet (1 mg total) by mouth daily. 01/26/13  Yes Curt Bears, MD  hydrALAZINE (APRESOLINE) 25 MG tablet Take 1 tablet (25 mg total) by mouth every 8 (eight) hours. 01/09/13  Yes Estela Leonie Green, MD  levETIRAcetam (KEPPRA) 1000 MG tablet Take 1 tablet (1,000 mg total) by mouth 2 (two) times daily. 01/09/13  Yes Estela Leonie Green, MD  lisinopril (PRINIVIL,ZESTRIL) 5 MG tablet Take 1 tablet (5 mg total) by mouth daily. 01/09/13  Yes Estela Leonie Green, MD  oxyCODONE-acetaminophen (PERCOCET/ROXICET) 5-325 MG per tablet Take 1 tablet by mouth every 4 (four) hours as needed for severe pain.   Yes Historical Provider, MD  pantoprazole (PROTONIX) 40 MG tablet Take  1 tablet (40 mg total) by mouth daily. 01/09/13  Yes Estela Leonie Green, MD  prochlorperazine (COMPAZINE) 10 MG tablet Take 1 tablet (10 mg total) by mouth every 6 (six) hours as needed for nausea or vomiting. 01/26/13  Yes Curt Bears, MD  zolpidem (AMBIEN) 10 MG tablet Take 10 mg by mouth at bedtime as needed for sleep. With 1 refill called rx to [facility] [phone] 01/23/13  Yes Marye Round, MD  dexamethasone (DECADRON) 4 MG tablet Take 4 mg by mouth 4 (four) times daily.    Historical Provider, MD      No Known  Allergies   Social History:  reports that he has been smoking Cigarettes.  He has a 12.5 pack-year smoking history. He has never used smokeless tobacco. He reports that he drinks alcohol. He reports that he uses illicit drugs (Cocaine).     Family History  Problem Relation Age of Onset  . Heart Problems Mother   . Cancer Mother 44    lung cancer  . Cancer Father 55    pancreas cancer       Physical Exam:  GEN:  Pleasant  68 y.o. male  examined  and in no acute distress; cooperative with exam Filed Vitals:   03/07/13 2332 03/08/13 0030 03/08/13 0200 03/08/13 0345  BP: 146/77 148/88 127/80 127/86  Pulse: 77 77 92 68  Temp:  98.2 F (36.8 C)  98.2 F (36.8 C)  TempSrc:  Oral  Oral  Resp: 13 16  17   Height:  6\' 2"  (1.88 m)    Weight:  74.844 kg (165 lb)    SpO2: 100% 100%  100%   Blood pressure 127/86, pulse 68, temperature 98.2 F (36.8 C), temperature source Oral, resp. rate 17, height 6\' 2"  (1.88 m), weight 74.844 kg (165 lb), SpO2 100.00%. PSYCH: He is alert and oriented x4; does not appear anxious does not appear depressed; affect is normal HEENT: Normocephalic and Atraumatic, Mucous membranes pink; PERRLA; EOM intact; Fundi:  Benign;  No scleral icterus, Nares: Patent, Oropharynx: Clear, Fair Dentition, Neck:  FROM, no cervical lymphadenopathy nor thyromegaly or carotid bruit; no JVD; Breasts:: Not examined CHEST WALL: No tenderness CHEST: Normal respiration, clear to auscultation bilaterally HEART: Regular rate and rhythm; no murmurs rubs or gallops BACK: No kyphosis or scoliosis; no CVA tenderness ABDOMEN: Positive Bowel Sounds, soft non-tender; no masses, no organomegaly Rectal Exam: Not done EXTREMITIES: No cyanosis, clubbing or edema; no ulcerations. Genitalia: not examined PULSES: 2+ and symmetric SKIN: Normal hydration no rash or ulceration CNS:   Mental Status:  Alert, oriented, thought content appropriate. Speech: Mild dysarthria,  Able to follow 3 step  commands without difficulty. In No obvious pain.  Cranial Nerves:  II: Discs flat bilaterally; Visual fields Intact,  or Decreased peripheral vision to the left or right. Pupils equal and reactive.  III,IV, VI: extra-ocular motions intact bilaterally  V,VII: smile symmetric, facial light touch sensation normal bilaterally  VIII: hearing decreasesd bilaterally  IX,X: gag reflex present  XI: bilateral shoulder shrug  XII: midline tongue extension  Motor:  Right : Upper extremity 5/5 Left: Upper extremity 4/5  Lower extremity 5/5 Lower extremity 5/5  Tone and bulk:normal tone throughout; no atrophy noted  Sensory: Pinprick and light touch intact throughout, bilaterally  Deep Tendon Reflexes: 2+ and symmetric throughout  Plantars/ Babinski: Right: equivocal or upgoing or normal Left: equivocal or upgoing or normal   Cerebellar: Finger to nose with difficulty with use of LUE Gait:  deferred  Vascular: pulses palpable throughout    Labs on Admission:  Basic Metabolic Panel:  Recent Labs Lab 03/03/13 0806 03/07/13 2136 03/07/13 2141  NA 145 140 140  K 4.3 4.3 4.2  CL  --  101 104  CO2 29 26  --   GLUCOSE 137 101* 100*  BUN 24.7 34* 35*  CREATININE 1.2 1.63* 1.90*  CALCIUM 9.2 9.0  --    Liver Function Tests:  Recent Labs Lab 03/03/13 0806 03/07/13 2136  AST 30 25  ALT 90* 59*  ALKPHOS 68 49  BILITOT 0.28 <0.2*  PROT 6.8 6.6  ALBUMIN 3.2* 3.2*   No results found for this basename: LIPASE, AMYLASE,  in the last 168 hours No results found for this basename: AMMONIA,  in the last 168 hours CBC:  Recent Labs Lab 03/03/13 0806 03/07/13 2136 03/07/13 2141  WBC 2.0* 4.0  --   NEUTROABS 1.1* 2.6  --   HGB 9.5* 8.1* 8.8*  HCT 29.2* 24.5* 26.0*  MCV 91.6 89.7  --   PLT 75* 50*  --    Cardiac Enzymes: No results found for this basename: CKTOTAL, CKMB, CKMBINDEX, TROPONINI,  in the last 168 hours  BNP (last 3 results) No results found for this basename: PROBNP,  in  the last 8760 hours CBG: No results found for this basename: GLUCAP,  in the last 168 hours  Radiological Exams on Admission: Ct Head Wo Contrast  03/07/2013   CLINICAL DATA:  Code stroke, left face and extremity weakness. 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  EXAM: CT HEAD WITHOUT CONTRAST  TECHNIQUE: Contiguous axial images were obtained from the base of the skull through the vertex without contrast.  COMPARISON:  NM PET IMAGE INITIAL (PI) SKULL BASE TO THIGH dated 01/26/2013; MR HEAD WO/W CM dated 01/13/2013; MR HEAD WO/W CM dated 01/03/2013; CT HEAD W/O CM dated 01/02/2013  FINDINGS: Previous areas of vasogenic edema related to intracranial metastatic disease have largely resolved. Slight residual hypodensity in the left parasagittal posterior frontal cortex representing the involuting larger metastasis. No acute stroke, acute hemorrhage, significant mass lesion, hydrocephalus, or extra-axial fluid. Generalized atrophy. Small vessel disease. No fracture or osseous destructive lesion. Clear sinuses. No mastoid fluid.  IMPRESSION: Atrophy and small vessel disease.  No acute intracranial findings.  Critical Value/emergent results were called by telephone at the time of interpretation on 03/07/2013 at 9:52 PM to Stroke neurologist who verbally acknowledged these results.   Electronically Signed   By: Rolla Flatten M.D.   On: 03/07/2013 20:56      EKG: Independently reviewed.  Normal Sinus Rhythm,     Assessment/Plan:   68 y.o. male with  Principal Problem:   CVA (cerebral infarction) Active Problems:   HTN (hypertension)   Adenocarcinoma of lung, stage 4   Tobacco abuse   Cocaine abuse   Seizure cerebral   +Troponin     1.   CVA-  Admitted for CVA workup, + Subcortical Infarct on CT scan.  Neuro Checks, ASA Rx,  MRI/MRA in AM and Carotid US in AM and 2 D ECHO.     2.   +Trop/ ?NSTEMI vs recent NSTEMI-  ASA Rx, and Cards Eval in AM,  Cycle troponins.   Nitropaste,  O2.  Cards: Dr Einar Gip to see this AM.     3.    HTN-    Continue Lisinopril and Amlodipine and Monitor BPs.     4.   Adenocarcinoma  of Lung with Metastatic Disease-  On Palliative Chemo  Rx.   And had Radiation to Brain Rx.    5.   Seizure -  Continue Keppra.     6.   Cocaine Abuse-   Substance abuse counseling needed.         Code Status:  FULL CODE Family Communication:    No Family Present Disposition Plan:     Inpatient    Time spent:  [place] [facility] Pager [phone]  If 7PM-7AM, please contact night-coverage [email or web address] Password [facility] 03/08/2013, 5:51 AM

## 2013-03-09 ENCOUNTER — Telehealth: Payer: Self-pay | Admitting: *Deleted

## 2013-03-09 ENCOUNTER — Encounter (HOSPITAL_COMMUNITY): Payer: Self-pay

## 2013-03-09 ENCOUNTER — Inpatient Hospital Stay (HOSPITAL_COMMUNITY): Payer: Medicare Other

## 2013-03-09 DIAGNOSIS — J690 Pneumonitis due to inhalation of food and vomit: Secondary | ICD-10-CM

## 2013-03-09 DIAGNOSIS — G939 Disorder of brain, unspecified: Secondary | ICD-10-CM

## 2013-03-09 LAB — URINE MICROSCOPIC-ADD ON

## 2013-03-09 LAB — URINALYSIS, ROUTINE W REFLEX MICROSCOPIC
Bilirubin Urine: NEGATIVE
GLUCOSE, UA: NEGATIVE mg/dL
Ketones, ur: NEGATIVE mg/dL
Nitrite: NEGATIVE
PROTEIN: NEGATIVE mg/dL
Specific Gravity, Urine: 1.017 (ref 1.005–1.030)
UROBILINOGEN UA: 1 mg/dL (ref 0.0–1.0)
pH: 6.5 (ref 5.0–8.0)

## 2013-03-09 LAB — BASIC METABOLIC PANEL
BUN: 23 mg/dL (ref 6–23)
CO2: 24 mEq/L (ref 19–32)
CREATININE: 1.39 mg/dL — AB (ref 0.50–1.35)
Calcium: 8.4 mg/dL (ref 8.4–10.5)
Chloride: 100 mEq/L (ref 96–112)
GFR calc non Af Amer: 51 mL/min — ABNORMAL LOW (ref >90)
GFR, EST AFRICAN AMERICAN: 59 mL/min — AB (ref >90)
Glucose, Bld: 77 mg/dL (ref 70–99)
POTASSIUM: 4.3 meq/L (ref 3.7–5.3)
SODIUM: 139 meq/L (ref 137–147)

## 2013-03-09 MED ORDER — ACETAMINOPHEN 650 MG RE SUPP
650.0000 mg | Freq: Once | RECTAL | Status: AC
  Administered 2013-03-09: 650 mg via RECTAL
  Filled 2013-03-09 (×2): qty 1

## 2013-03-09 MED ORDER — RESOURCE THICKENUP CLEAR PO POWD
ORAL | Status: DC | PRN
  Filled 2013-03-09: qty 125

## 2013-03-09 MED ORDER — PIPERACILLIN-TAZOBACTAM 3.375 G IVPB
3.3750 g | Freq: Three times a day (TID) | INTRAVENOUS | Status: DC
  Administered 2013-03-09 – 2013-03-15 (×18): 3.375 g via INTRAVENOUS
  Filled 2013-03-09 (×22): qty 50

## 2013-03-09 NOTE — Progress Notes (Signed)
Occupational Therapy Evaluation Patient Details Name: Carlos Beasley MRN: [id number] DOB: January 31, 1945 Today's Date: 03/09/2013 Time: 4401-0272 OT Time Calculation (min): 44 min  OT Assessment / Plan / Recommendation History of present illness 68 y.o. male admitted to [facility] on 03/07/13 with with Metastatic Lung [place] Lung Cancer diagnosed in 01/2013  on Palliative Chemo Rx and recent Radiation Rx to the Brain who presents to the ED with complaints of sudden onset of Left sided weakness. 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     Clinical Impression   PTA, pt lived alone and was independent with ADL and mobility for ADL. Pt presents with significant deficits below s/p R frontoparietal and small area of temporal lobes. Pt presents with significant L neglect and apparent  L homonymous hemianopsia. Given pt's current condition, rec palliative care consult. At this time, rec SNF for rehab unless pt appropriate for other arrangements, i.e. [employer] [facility]. Discussed D/C rec to SNF with family. Pt will benefit from skilled OT services to facilitate D/C to next venue due to below deficits.  Discussed concerns regarding swallow with speech, who agreed to discuss "gurling" sound with nsg.  On entry to room, family feeding pt while HOB was @ 30 degrees. Educated pt's son on contraindications for feeding pt at this position.    OT Assessment  Patient needs continued OT Services    Follow Up Recommendations  Other (comment);SNF;Supervision/Assistance - 24 hour (Palliative care)    Barriers to Discharge Decreased caregiver support (sons live out of town. metastatic CA)    Equipment Recommendations  None recommended by OT    Recommendations for Other Services    Frequency  Min 3X/week    Precautions / Restrictions Precautions Precautions: Fall Precaution Comments: left hemiperesis.    Pertinent Vitals/Pain stable    ADL  Eating/Feeding: +1 Total  assistance;Other (comment) (pocketing food L cheek. delayed intiation/swallow) Grooming: Maximal assistance Where Assessed - Grooming: Supported sitting Upper Body Bathing: Maximal assistance Where Assessed - Upper Body Bathing: Supported sitting Lower Body Bathing: +1 Total assistance Where Assessed - Lower Body Bathing: Supported sit to stand Upper Body Dressing: +1 Total assistance Where Assessed - Upper Body Dressing: Supported sitting Lower Body Dressing: +1 Total assistance Where Assessed - Lower Body Dressing: Supported sit to Lobbyist: +2 Total assistance Toilet Transfer Method: Sit to stand Toileting - Clothing Manipulation and Hygiene: +1 Total assistance (foley) Equipment Used: Gait belt Transfers/Ambulation Related to ADLs: max A sit - stand. will need +2 for transfers and ambulation ADL Comments: delayed swallow/ delayed cough with thin liquids . will discuss with speech    OT Diagnosis: Generalized weakness;Cognitive deficits;Disturbance of vision;Hemiplegia non-dominant side  OT Problem List: Decreased strength;Decreased range of motion;Decreased activity tolerance;Impaired balance (sitting and/or standing);Impaired vision/perception;Decreased coordination;Decreased cognition;Decreased safety awareness;Decreased knowledge of use of DME or AE;Decreased knowledge of precautions;Cardiopulmonary status limiting activity;Impaired sensation;Impaired tone;Impaired UE functional use;Increased edema OT Treatment Interventions: Self-care/ADL training;Therapeutic exercise;Neuromuscular education;Energy conservation;DME and/or AE instruction;Therapeutic activities;Cognitive remediation/compensation;Visual/perceptual remediation/compensation;Patient/family education;Balance training   OT Goals(Current goals can be found in the care plan section) Acute Rehab OT Goals Patient Stated Goal: none stated OT Goal Formulation: With patient Time For Goal Achievement:  03/23/13 Potential to Achieve Goals: Good  Visit Information  Last OT Received On: 03/09/13 Assistance Needed: +2 History of Present Illness: 68 y.o. male admitted to [facility] on 03/07/13 with with Metastatic Lung [place] Lung Cancer diagnosed in 01/2013  on Palliative Chemo Rx and recent Radiation Rx  to the Brain who presents to the ED with complaints of sudden onset of Left sided weakness. 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         Prior [place] expects to be discharged to:: Private residence Living Arrangements: Other relatives (lives with his father who is in his 52s) Additional Comments: Two sons who live in [place] and [place] were here helping to transition their grandfather to ALF.  While they were here their father had a stroke.   Prior Function Level of Independence: Independent Comments: Sons report that they have seen a decline in his mobility and balance over the past year, but he was still was able to take care of himself and walk.  He was no longer driving.   Communication Communication: Expressive difficulties Dominant Hand: Right         Vision/Perception Vision - History Baseline Vision: No visual deficits Patient Visual Report: No change from baseline Vision - Assessment Eye Alignment: Within Functional Limits Vision Assessment: Vision tested Ocular Range of Motion: Within Functional Limits Alignment/Gaze Preference: Gaze right;Head turned Tracking/Visual Pursuits: Decreased smoothness of horizontal tracking;Decreased smoothness of vertical tracking;Impaired - to be further tested in functional context;Unable to hold eye position out of midline Saccades: Additional eye shifts occurred during testing;Additional head turns occurred during testing;Decreased speed of saccadic movement;Impaired - to be further tested in functional context Convergence: Impaired (comment) Visual Fields:  Left homonymous hemianopsia Depth Perception: undershooting when reaching for objects. poor visual attention Additional Comments: perseveration and apparent L neglect during "clock" draw   Cognition  Cognition Arousal/Alertness: Lethargic (per pt it has been a long night. ) Behavior During Therapy: Flat affect;Impulsive Overall Cognitive Status: Impaired/Different from baseline Area of Impairment: Attention;Memory;Following commands;Safety/judgement;Awareness;Problem solving Current Attention Level: Sustained Following Commands: Follows one step commands with increased time;Follows one step commands consistently (to the best of his physical ability) Safety/Judgement: Decreased awareness of safety;Decreased awareness of deficits (Pt thinks he is safe to get out of bed) Awareness: Intellectual Problem Solving: Difficulty sequencing;Requires verbal cues;Requires tactile cues;Slow processing;Decreased initiation General Comments: Pt is able to tell me that his left side is not working, but has right gaze preference.  He feels as though he is leaning to the right and is pushing hard to the left in sitting.     Extremity/Trunk Assessment Upper Extremity Assessment Upper Extremity Assessment: LUE deficits/detail LUE Deficits / Details: Flaccid overall. Beginning flexor synergy involuntarily (unable to locate LUE) LUE Sensation: decreased light touch;decreased proprioception ("feels dead") LUE Coordination: decreased fine motor;decreased gross motor Lower Extremity Assessment Lower Extremity Assessment: LLE deficits/detail LLE Deficits / Details: flaccid left leg.  No signs of any active muscle control or trace muscle contraction at the ankle, knee or hip.   LLE Coordination: decreased fine motor;decreased gross motor Cervical / Trunk Assessment Cervical / Trunk Assessment: Other exceptions Cervical / Trunk Exceptions: forward head. L bias     Mobility Bed Mobility Overal bed mobility: Needs  Assistance Bed Mobility: Supine to Sit;Sit to Supine Supine to sit: Mod assist;HOB elevated Sit to supine: Mod assist General bed mobility comments: Mod assist to support trunk and left hemibody during transitions to sitting.  Pt using his right arm to pull to sitting with bedrail.  Pt also able to move his right leg over the side of the bed on the right side, but had significant difficulty getting it back into the bed going to the left.  Transfers Overall transfer level: Needs assistance (not tested due to significant pushing to the left in sitting) Transfers: Sit to/from Stand Sit to Stand: Total assist;Max assist;+2 physical assistance General transfer comment: "apparent pusher syndrome"     Exercise Other Exercises Other Exercises: Discussed need to not feed pt while lying down due to risk of aspiration   Balance Balance Overall balance assessment: Needs assistance Sitting-balance support: Feet supported;Bilateral upper extremity supported Sitting balance-Leahy Scale: Zero Sitting balance - Comments: pusher. L bias Postural control: Posterior lean;Left lateral lean Standing balance support: Single extremity supported;During functional activity Standing balance-Leahy Scale: Zero General Comments General comments (skin integrity, edema, etc.): Discussed D/C rec with family   End of Session OT - End of Session Equipment Utilized During Treatment: Gait belt Activity Tolerance: Patient limited by fatigue Patient left: in bed;with call bell/phone within reach;with bed alarm set;with family/visitor present Nurse Communication: Mobility status;Other (comment) (concerns regarding swallow)  GO     Carlos Beasley,Carlos Beasley 03/09/2013, 2:35 PM [facility], OTR/L  [phone] 03/09/2013

## 2013-03-09 NOTE — Progress Notes (Signed)
Clinical Social Work Department CLINICAL SOCIAL WORK PLACEMENT NOTE 03/09/2013  Patient:  DOYL, BITTING  Account Number:  [id number] Admit date:  03/07/2013  Clinical Social Worker:  Adair Laundry  Date/time:  03/09/2013 03:30 PM  Clinical Social Work is seeking post-discharge placement for this patient at the following level of care:   SKILLED NURSING   (*CSW will update this form in Epic as items are completed)   03/09/2013  Patient/family provided with [place] Department of Clinical Social Work's list of facilities offering this level of care within the geographic area requested by the patient (or if unable, by the patient's family).  03/09/2013  Patient/family informed of their freedom to choose among providers that offer the needed level of care, that participate in Medicare, Medicaid or managed care program needed by the patient, have an available bed and are willing to accept the patient.  03/09/2013  Patient/family informed of [facility]' ownership interest in [facility], as well as of the fact that they are under no obligation to receive care at this facility.  PASARR submitted to EDS on 03/09/2013 PASARR number received from EDS on 03/09/2013  FL2 transmitted to all facilities in geographic area requested by pt/family on  03/09/2013 FL2 transmitted to all facilities within larger geographic area on   Patient informed that his/her managed care company has contracts with or will negotiate with  certain facilities, including the following:     Patient/family informed of bed offers received:   Patient chooses bed at  Physician recommends and patient chooses bed at    Patient to be transferred to  on   Patient to be transferred to facility by   The following physician request were entered in Epic:   Additional CommentsBerton Mount, [place]

## 2013-03-09 NOTE — Progress Notes (Addendum)
Clinical Social Work Department BRIEF PSYCHOSOCIAL ASSESSMENT 03/09/2013  Patient:  Carlos Beasley, Carlos Beasley     Account Number:  [id number]     Admit date:  03/07/2013  Clinical Social Worker:  Adair Laundry  Date/Time:  03/09/2013 12:00 N  Referred by:  Physician  Date Referred:  03/09/2013 Referred for  SNF Placement   Other Referral:   Interview type:  Family Other interview type:   Pt sleeping during assessment; completed assessment with sons at bedside    PSYCHOSOCIAL DATA Living Status:  Family Admitted from facility:   Level of care:   Primary support name:  Della Homan [phone] Primary support relationship to patient:  CHILD, ADULT Degree of support available:   Pt has very supportive family    CURRENT CONCERNS Current Concerns  Post-Acute Placement   Other Concerns:    SOCIAL WORK ASSESSMENT / PLAN CSW made aware of PT recommendation for CIR. CSW discussed SNF back up with pt sons at bedside. PT sons were receptive to the idea of ST rehab at [facility]. Pt sons both from out of town and unfamiliar with area SNFs. CSW explained referral process and pt sons are agreeable to pt being faxed out to [facility] inquired as to how they think pt would feel about SNF. Pt sons to speak with pt they informed CSW "he will have to be okay with it there is no other option" Pt sons have concerns about pt returning home since they both do not live locally. CSW to send out referral but will speak with pt about plans when pt is awake.   Assessment/plan status:  Psychosocial Support/Ongoing Assessment of Needs Other assessment/ plan:   Information/referral to community resources:   SNF list to be provided with bed offers    PATIENT'S/FAMILY'S RESPONSE TO PLAN OF CARE: Pt family agreeable to SNF. Pt sleeping durring assessment so CSW to speak with pt at a later time to confirm pt is also agreeable.       [place], [place]

## 2013-03-09 NOTE — Evaluation (Signed)
Physical Therapy Evaluation Patient Details Name: Carlos Beasley MRN: [id number] DOB: 1945/08/08 Today's Date: 03/09/2013 Time: 1133-1200 PT Time Calculation (min): 27 min  PT Assessment / Plan / Recommendation History of Present Illness  68 y.o. male admitted to [facility] on 03/07/13 with with Metastatic Lung [place] Lung Cancer diagnosed in 01/2013  on Palliative Chemo Rx and recent Radiation Rx to the Brain who presents to the ED with complaints of sudden onset of Left sided weakness. 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    Clinical Impression  Pt with dense left hemiplegia and right gaze preference.  He is a strong pusher to the left in sitting.  He was generally independent PTA (did not drive) and lived with an elderly father who is now transitioning to ALF.  The pt would benefit from intensive inpatient level therapies to try to maximize his mobility so he can return to as independent level of care as possible.   PT to follow acutely for deficits listed below.       PT Assessment  Patient needs continued PT services    Follow Up Recommendations  CIR    Does the patient have the potential to tolerate intense rehabilitation     Yes  Barriers to Discharge Decreased caregiver support Sons live in [place] and [place] and pt lives with his elderly father who is currently moving into an ALF.      Equipment Recommendations  Wheelchair (measurements PT);Wheelchair cushion (measurements PT);Hospital bed    Recommendations for Other Services Rehab consult   Frequency Min 4X/week    Precautions / Restrictions Precautions Precautions: Fall Precaution Comments: left hemiperesis.    Pertinent Vitals/Pain See vitals flow sheet.      Mobility  Bed Mobility Overal bed mobility: Needs Assistance Bed Mobility: Supine to Sit;Sit to Supine Supine to sit: Mod assist;HOB elevated Sit to supine: Mod assist General bed mobility comments: Mod assist to  support trunk and left hemibody during transitions to sitting.  Pt using his right arm to pull to sitting with bedrail.  Pt also able to move his right leg over the side of the bed on the right side, but had significant difficulty getting it back into the bed going to the left.   Transfers Overall transfer level:  (not tested due to significant pushing to the left in sitting) Modified Rankin (Stroke Patients Only) Pre-Morbid Rankin Score: No significant disability Modified Rankin: Severe disability    PT Diagnosis: Difficulty walking;Abnormality of gait;Generalized weakness;Hemiplegia dominant side;Altered mental status  PT Problem List: Decreased strength;Decreased activity tolerance;Decreased balance;Decreased mobility;Decreased coordination;Decreased cognition;Decreased knowledge of use of DME;Decreased safety awareness;Impaired sensation PT Treatment Interventions: DME instruction;Gait training;Functional mobility training;Therapeutic activities;Therapeutic exercise;Balance training;Neuromuscular re-education;Cognitive remediation;Patient/family education;Wheelchair mobility training     PT Goals(Current goals can be found in the care plan section) Acute Rehab PT Goals Patient Stated Goal: to get some rest PT Goal Formulation: With patient/family Time For Goal Achievement: 03/23/13 Potential to Achieve Goals: Good  Visit Information  Last PT Received On: 03/09/13 Assistance Needed: +2 History of Present Illness: 68 y.o. male admitted to [facility] on 03/07/13 with with Metastatic Lung [place] Lung Cancer diagnosed in 01/2013  on Palliative Chemo Rx and recent Radiation Rx to the Brain who presents to the ED with complaints of sudden onset of Left sided weakness. 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         Prior Functioning  Home Living  Family/patient expects to be discharged to:: Private residence Living Arrangements: Other relatives  (lives with his father who is in his 39s) Additional Comments: Two sons who live in [place] and [place] were here helping to transition their grandfather to ALF.  While they were here their father had a stroke.   Prior Function Level of Independence: Independent Comments: Sons report that they have seen a decline in his mobility and balance over the past year, but he was still was able to take care of himself and walk.  He was no longer driving.   Communication Communication: Expressive difficulties    Cognition  Cognition Arousal/Alertness: Lethargic (per pt it has been a long night. ) Behavior During Therapy: WFL for tasks assessed/performed Overall Cognitive Status: Impaired/Different from baseline Area of Impairment: Attention;Memory;Following commands;Safety/judgement;Awareness;Problem solving Current Attention Level: Sustained Following Commands: Follows one step commands with increased time;Follows one step commands consistently (to the best of his physical ability) Safety/Judgement: Decreased awareness of safety;Decreased awareness of deficits Awareness: Intellectual Problem Solving: Difficulty sequencing;Requires verbal cues;Requires tactile cues General Comments: Pt is able to tell me that his left side is not working, but has right gaze preference.  He feels as though he is leaning to the right and is pushing hard to the left in sitting.     Extremity/Trunk Assessment Upper Extremity Assessment Upper Extremity Assessment: Defer to OT evaluation Lower Extremity Assessment Lower Extremity Assessment: LLE deficits/detail LLE Deficits / Details: flaccid left leg.  No signs of any active muscle control or trace muscle contraction at the ankle, knee or hip.   LLE Coordination: decreased fine motor;decreased gross motor Cervical / Trunk Assessment Cervical / Trunk Assessment: Normal   Balance Balance Overall balance assessment: Needs assistance Sitting-balance support: Single extremity  supported;Feet supported Sitting balance-Leahy Scale: Poor Sitting balance - Comments: Mod to max assist sitting EOB to support trunk in sitting.  Pt pushing to the left with his strong right arm and when asked states that he feels as though he is leaning right.  Unable to find son who is standing on his left side even with assist to turn his head to the left.  Strong right gaze preference.  Pt reports left arm is numb.   General Comments General comments (skin integrity, edema, etc.): O2 satable on RA, so RN and RN tech made aware that PT left O2 off.    End of Session PT - End of Session Equipment Utilized During Treatment: Gait belt Activity Tolerance: Patient limited by fatigue Patient left: in bed;with call bell/phone within reach;with bed alarm set Nurse Communication: Mobility status    Wells Guiles B. [place], [place], DPT [phone]   03/09/2013, 12:21 PM

## 2013-03-09 NOTE — Progress Notes (Addendum)
Stroke Team Progress Note  HISTORY 68 y.o. male with a history of metastatic lung cancer undergoing palliative chemotherapy who began having symptoms at approximately 9 PM day of admission 03/07/2013 at which time he began having left-sided weakness arm greater than leg. He did not have any changes in consciousness or abnormal movements before this weakness. EMS was called and activated a code stroke en route. 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 He had a CT here which shows improvement, though there is still a hypodensity where one of the masses is known to be based on his previous MRI. Of note, he did have a recent episode of seizure and was started on Keppra at that time. TPA was not considered secondary to brain metastases. He was admitted to the hospital for further stroke evaluation and treatment.  SUBJECTIVE No one at bedside. Patient without complaints.  OBJECTIVE Most recent Vital Signs: Filed Vitals:   03/08/13 2000 03/09/13 0000 03/09/13 0400 03/09/13 0756  BP: 164/87 140/58 143/72 157/85  Pulse: 87 97 86 99  Temp: 99.3 F (37.4 C) 101.8 F (38.8 C) 98.6 F (37 C)   TempSrc: Oral Oral Oral Oral  Resp: 17 16 16 16   Height:      Weight:      SpO2: 100% 100% 99% 98%   CBG (last 3)  No results found for this basename: GLUCAP,  in the last 72 hours  IV Fluid Intake:   . sodium chloride 100 mL/hr at 03/08/13 1016    MEDICATIONS  . amLODipine  10 mg Oral Daily  . aspirin  325 mg Oral Daily  . folic acid  1 mg Oral Daily  . hydrALAZINE  25 mg Oral 3 times per day  . levETIRAcetam  1,000 mg Oral BID  . lisinopril  5 mg Oral Daily  . pantoprazole  40 mg Oral Daily   PRN:  prochlorperazine, senna-docusate  Diet:  **Dysphagia 1 nectar thick liquids Activity:  Up with assistance DVT Prophylaxis:  SCDs  CLINICALLY SIGNIFICANT STUDIES Basic Metabolic Panel:   Recent Labs Lab 03/07/13 2136  03/07/13 2141 03/09/13 0300  NA 140 140 139  K 4.3 4.2 4.3  CL 101 104 100  CO2 26  --  24  GLUCOSE 101* 100* 77  BUN 34* 35* 23  CREATININE 1.63* 1.90* 1.39*  CALCIUM 9.0  --  8.4   Liver Function Tests:   Recent Labs Lab 03/03/13 0806 03/07/13 2136  AST 30 25  ALT 90* 59*  ALKPHOS 68 49  BILITOT 0.28 <0.2*  PROT 6.8 6.6  ALBUMIN 3.2* 3.2*   CBC:   Recent Labs Lab 03/03/13 0806 03/07/13 2136 03/07/13 2141  WBC 2.0* 4.0  --   NEUTROABS 1.1* 2.6  --   HGB 9.5* 8.1* 8.8*  HCT 29.2* 24.5* 26.0*  MCV 91.6 89.7  --   PLT 75* 50*  --    Coagulation:   Recent Labs Lab 03/07/13 2136  LABPROT 13.3  INR 1.03   Cardiac Enzymes:   Recent Labs Lab 03/08/13 0530 03/08/13 1451 03/08/13 2155  TROPONINI 0.44* 0.41* 0.41*   Urinalysis:   Recent Labs Lab 03/07/13 2247 03/09/13 0118  COLORURINE YELLOW YELLOW  LABSPEC 1.021 1.017  PHURINE 6.0 6.5  GLUCOSEU NEGATIVE NEGATIVE  HGBUR NEGATIVE SMALL*  BILIRUBINUR NEGATIVE NEGATIVE  KETONESUR NEGATIVE NEGATIVE  PROTEINUR NEGATIVE NEGATIVE  UROBILINOGEN 1.0 1.0  NITRITE NEGATIVE NEGATIVE  LEUKOCYTESUR NEGATIVE LARGE*  Lipid Panel    Component Value Date/Time   CHOL 231* 03/08/2013 0530   TRIG 67 03/08/2013 0530   HDL 147 03/08/2013 0530   CHOLHDL 1.6 03/08/2013 0530   VLDL 13 03/08/2013 0530   LDLCALC 71 03/08/2013 0530   HgbA1C  Lab Results  Component Value Date   HGBA1C 6.1* 01/02/2013    Urine Drug Screen:     Component Value Date/Time   LABOPIA NONE DETECTED 03/07/2013 2247   COCAINSCRNUR POSITIVE* 03/07/2013 2247   LABBENZ NONE DETECTED 03/07/2013 2247   AMPHETMU NONE DETECTED 03/07/2013 2247   THCU NONE DETECTED 03/07/2013 2247   LABBARB NONE DETECTED 03/07/2013 2247    Alcohol Level:   Recent Labs Lab 03/07/13 2136  ETH <11    CT of the brain  03/07/2013   Atrophy and small vessel disease.  No acute intracranial findings.    MRI of the brain  03/08/2013   1. New, mild diffusion abnormality in the right  frontoparietal region as well as small foci of diffusion abnormality in both cerebellar hemispheres. These findings are compatible with acute to early subacute infarcts, however new/underlying metastases cannot be excluded given the lack of IV contrast. 2. Near complete interval resolution of vasogenic edema associated with the two previously described metastases.   MRA of the brain  03/08/2013    Intracranial atherosclerotic change with mild narrowing of the distal right vertebral artery and mid basilar artery, mild to moderate narrowing of the supraclinoid left ICA, and apparent moderate to severe proximal left PCA stenosis. However, there is some motion artifact which may account for some of this appearance. If clinically desired, repeat MRA imaging could be performed.     2D Echocardiogram  EF 35-40% with no source of embolus. Diffuse hypokinesis.  Carotid Doppler  No evidence of hemodynamically significant internal carotid artery stenosis. Vertebral artery flow is antegrade.  CXR   03/09/2013   Patchy interstitial infiltrate right base.  Stable cardiomegaly.  03/08/2013   Stable chronic lung disease and cardiomegaly.   EKG  normal EKG, normal sinus rhythm, unchanged from previous tracings. For complete results please see formal report.   Therapy Recommendations CIR by PT, SNF by OT  Physical Exam   Mental Status:  Patient is awake, alert, oriented to person, place, month, year, and situation.  Immediate and remote memory are intact.  Patient is able to give a clear and coherent history.  No signs of aphasia or neglect  Cranial Nerves:  II: Visual Fields are full. Pupils are equal, round, and reactive to light. Discs are difficult to visualize. There is no extinction to double simultaneous stimulation and visual fields.  III,IV, VI: EOMI without ptosis or diploplia.  V: Facial sensation is decreased on the left  VII: Facial movement is notable for left facial weakness  VIII: hearing is  intact to voice  X: Uvula elevates symmetrically  XI: Shoulder shrug is symmetric. 2 XII: tongue is midline without atrophy or fasciculations.  Motor:  Tone is normal. Bulk is normal. 5/5 strength was present on the right side, the left side he has 2/5 weakness of the left arm and 4 minus/5 weakness of the left leg.  Sensory:  Absent sensation of the left arm, decreased in the left leg  Deep Tendon Reflexes:  2+ and symmetric in the biceps and patellae.  Cerebellar:  FNF intact on right, unable to perform on left  Gait:  Did not perform secondary to patient safety concerns   ASSESSMENT Mr.  HORACIO WERTH is a 68 y.o. male with stage IV non-small cell lung cancer, adenocarcinoma with brain metastasis status post stereotactic radiotherapy who is currently undergoing chemotherapy who presents with new onset left hemiparesis. Imaging confirms a right parietal and bilateral cerebellar abnormalities, metastasis versus stroke. Patient was positive for cocaine. 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 Patient with resultant left hemiplegia, left hemisensory loss. Prognosis for neurologic recovery is poor. Given stroke, poor candidate for ongoing chemotherapy.  Elevated temperature, concern for aspiration pneumonia seizure disorder, continue [facility] day # 2  TREATMENT/PLAN  Recommend palliative care consult  Nothing further to add from the stroke standpoint. Stroke service will sign off.   Burnetta Sabin, MSN, RN, ANVP-BC, ANP-BC, [facility] Pager: [phone] 03/09/2013 9:26 AM  I have personally obtained a history, examined the patient, evaluated imaging results, and formulated the assessment and plan of care. I agree with the above. Antony Contras, MD

## 2013-03-09 NOTE — Progress Notes (Signed)
Physical medicine and rehabilitation consult requested. Met with patient as well as family at length and discussion of patient's multi-medical issues in relation to metastatic lung cancer. At this time the request is proceed with skilled nursing facility and hold on formal inpatient rehabilitation at this time. Palliative care has been consult and they want to discuss all issues with palliative care at this time before making any further decisions

## 2013-03-09 NOTE — Progress Notes (Addendum)
Patient ID: Carlos Beasley  male  KGM:[id number]    DOB: March 29, 1945    DOA: 03/07/2013  PCP: [name], MD  Assessment/Plan: Principal Problem: Left sided hemiparesis - acute CVA  - f/u MRI reveals multifocal acute/subacute infarcts in right fronto parietal and b/l cerebellar areas  - PT eval recommending inpatient rehabilitation - carotid ultrasound negative for stenosis  - ECHO negative for thrombus  - left intracranial ICA with mild to mod narrowing  - LDL 71 but choles 231, continue aspirin 325 mg daily,  - Discussed in detail with Dr. Leonie Man, recommended palliative consult for goals of care  Lung Cancer- [place] , stage IV with metastasis to brain - cont treatment per onc- palliative chemo and radiation  - previous brain mets show decreased vasogenic edema   Positive Troponin  - suspected to be troponin leak  - no further work up per Dr Einar Gip as pt is not a candidate for strong anticoagulants due to his cancerous masses   Aspiration pneumonia, UTI: Patient overnight was spiking fevers - Await urine cultures, blood cultures - Chest x-ray shows patchy infiltrate right base, likely aspiration pneumonia, started on Zosyn  ARF  - Continue gentle hydration, creatinine improving  Chronic systolic and diastolic CHF  - compensated   Cocaine abuse  Tobacco abuse   Seizure disorder  - cont Keppra   DVT Prophylaxis:  Code Status:  Family Communication: d/w patient's son, [name] in detail.   Disposition:  Consultants:  Neurology  Cardiology  Palliative medicine consult today  Procedures:  None  Antibiotics:  IV Zosyn 3/9>>    Subjective: Patient seen and examined, left-sided weakness/hemiparesis  Objective: Weight change:   Intake/Output Summary (Last 24 hours) at 03/09/13 1426 Last data filed at 03/09/13 0100  Gross per 24 hour  Intake 1473.33 ml  Output   1100 ml  Net 373.33 ml   Blood pressure 126/69, pulse 98, temperature 100.1 F (37.8  C), temperature source Oral, resp. rate 16, height 6\' 2"  (1.88 m), weight 74.844 kg (165 lb), SpO2 96.00%.  Physical Exam: General: Alert and awake, oriented x3, not in any acute distress. CVS: S1-S2 clear, no murmur rubs or gallops Chest: clear to auscultation bilaterally, no wheezing, rales or rhonchi Abdomen: soft nontender, nondistended, normal bowel sounds  Extremities: no cyanosis, clubbing or edema noted bilaterally Neuro: 5 x 5 strength on right side, left ue 2/5, LLE 3-4/5  Lab Results: Basic Metabolic Panel:  Recent Labs Lab 03/07/13 2136 03/07/13 2141 03/09/13 0300  NA 140 140 139  K 4.3 4.2 4.3  CL 101 104 100  CO2 26  --  24  GLUCOSE 101* 100* 77  BUN 34* 35* 23  CREATININE 1.63* 1.90* 1.39*  CALCIUM 9.0  --  8.4   Liver Function Tests:  Recent Labs Lab 03/03/13 0806 03/07/13 2136  AST 30 25  ALT 90* 59*  ALKPHOS 68 49  BILITOT 0.28 <0.2*  PROT 6.8 6.6  ALBUMIN 3.2* 3.2*   No results found for this basename: LIPASE, AMYLASE,  in the last 168 hours No results found for this basename: AMMONIA,  in the last 168 hours CBC:  Recent Labs Lab 03/03/13 0806 03/07/13 2136 03/07/13 2141  WBC 2.0* 4.0  --   NEUTROABS 1.1* 2.6  --   HGB 9.5* 8.1* 8.8*  HCT 29.2* 24.5* 26.0*  MCV 91.6 89.7  --   PLT 75* 50*  --    Cardiac Enzymes:  Recent Labs Lab 03/08/13 0530  03/08/13 1451 03/08/13 2155  TROPONINI 0.44* 0.41* 0.41*   BNP: No components found with this basename: POCBNP,  CBG: No results found for this basename: GLUCAP,  in the last 168 hours   Micro Results: No results found for this or any previous visit (from the past 240 hour(s)).  Studies/Results: Dg Chest 2 View  03/08/2013   CLINICAL DATA:  Cerebral infarction and left upper extremity weakness. History of lung carcinoma.  EXAM: CHEST - 2 VIEW  COMPARISON:  DG CHEST 2 VIEW dated 02/21/2013; MR MRA HEAD W/O CM dated 03/08/2013; CT HEAD W/O CM dated 03/07/2013; NM PET IMAGE INITIAL (PI) SKULL  BASE TO THIGH dated 01/26/2013; US BIOPSY dated 01/06/2013; CT CHEST W/CM dated 01/04/2013  FINDINGS: Stable cardiomegaly and mild chronic lung disease without overt edema, airspace consolidation or pleural effusion. No masses are identified. The bony thorax is unremarkable.  IMPRESSION: Stable chronic lung disease and cardiomegaly.   Electronically Signed   By: Aletta Edouard M.D.   On: 03/08/2013 13:41   Dg Chest 2 View  02/21/2013   CLINICAL DATA:  Hypertension, history of metastatic lung cancer  EXAM: CHEST  2 VIEW  COMPARISON:  07/11/2012  FINDINGS: Stable heart size and vascularity. Lungs remain clear. 7 mm right upper low spiculated density, faintly visualized by chest x-ray overlying the first anterior rib shadow. This is stable in size. No new superimposed pneumonia, collapse or consolidation. No edema, effusion or pneumothorax. Trachea midline.  IMPRESSION: Stable chest exam.  No superimposed acute process   Electronically Signed   By: Daryll Brod M.D.   On: 02/21/2013 12:20   Ct Head Wo Contrast  03/07/2013   CLINICAL DATA:  Code stroke, left face and extremity weakness. 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  EXAM: CT HEAD WITHOUT CONTRAST  TECHNIQUE: Contiguous axial images were obtained from the base of the skull through the vertex without contrast.  COMPARISON:  NM PET IMAGE INITIAL (PI) SKULL BASE TO THIGH dated 01/26/2013; MR HEAD WO/W CM dated 01/13/2013; MR HEAD WO/W CM dated 01/03/2013; CT HEAD W/O CM dated 01/02/2013  FINDINGS: Previous areas of vasogenic edema related to intracranial metastatic disease have largely resolved. Slight residual hypodensity in the left parasagittal posterior frontal cortex representing the involuting larger metastasis. No acute stroke, acute hemorrhage, significant mass lesion, hydrocephalus, or extra-axial fluid. Generalized atrophy. Small vessel disease. No fracture or osseous destructive lesion. Clear sinuses. No mastoid fluid.   IMPRESSION: Atrophy and small vessel disease.  No acute intracranial findings.  Critical Value/emergent results were called by telephone at the time of interpretation on 03/07/2013 at 9:52 PM to Stroke neurologist who verbally acknowledged these results.   Electronically Signed   By: Rolla Flatten M.D.   On: 03/07/2013 20:56   Mr Brain Wo Contrast  03/08/2013   CLINICAL DATA:  Acute onset left-sided weakness. History of metastatic non-small cell lung cancer with recent brain radiation therapy.  EXAM: MRI HEAD WITHOUT CONTRAST  MRA HEAD WITHOUT CONTRAST  TECHNIQUE: Multiplanar, multiecho pulse sequences of the brain and surrounding structures were obtained without intravenous contrast. Angiographic images of the head were obtained using MRA technique without contrast.  COMPARISON:  CT HEAD W/O CM dated 03/07/2013; MR HEAD WO/W CM dated 01/13/2013  FINDINGS: MRI HEAD FINDINGS  There is a small region of mildly restricted diffusion predominantly involving the posterior right frontal lobe and corona radiata with extension superiorly into the postcentral gyrus and inferiorly into the posterior insula. There is  only faint associated T2 hyperintensity. Two small foci of restricted diffusion in the cerebellar hemispheres measure 10 mm on the right and 3 mm on the left. There is a small focus of susceptibility artifact associated with the diffusion abnormality in the right insula, compatible with a small amount of hemorrhage. Remote focus of hemorrhage in the left parietal lobe is unchanged. There may be a tiny focus of microhemorrhage in the anterior right temporal lobe.  Vasogenic edema in the left parietal lobe associated with the previously described metastasis has nearly completely resolved. Left cerebellar vasogenic edema has completely resolved. Patchy foci of T2 hyperintensity in the subcortical and periventricular white matter are similar to the prior study and compatible with mild chronic small vessel ischemic disease.  There is mild generalized cerebral atrophy. There is no midline shift or extra-axial fluid collection.  Orbits are unremarkable. There is trace right maxillary sinus mucosal thickening. Mastoid air cells are clear. Major intracranial vascular flow voids are preserved.  MRA HEAD FINDINGS  Visualized distal vertebral arteries are patent with the left being dominant. There is mild narrowing of the distal right vertebral artery at the vertebrobasilar junction. Left PICA origin is patent. Right PICA is not identified. AICA origins and SCA origins are patent. The basilar artery is mildly irregular diffusely with mild narrowing in its midportion. There is a moderate to severe focal stenosis of the left PCA near its origin. The left posterior communicating artery appears irregular with apparent narrowing distally, however this may be due to motion artifact. There is mild bilateral MCA irregularity.  Internal carotid arteries are patent from skullbase to carotid termini. There is mild bilateral carotid siphon irregularity. There is apparent mild to moderate narrowing of the distal left supraclinoid ICA, although this is also in a region of motion artifact. 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 Left A2 is supplied by the anterior communicating artery. There is mild bilateral proximal MCA irregularity without evidence of significant stenosis. No intracranial aneurysm is identified.  IMPRESSION: 1. New, mild diffusion abnormality in the right frontoparietal region as well as small foci of diffusion abnormality in both cerebellar hemispheres. These findings are compatible with acute to early subacute infarcts, however new/underlying metastases cannot be excluded given the lack of IV contrast. 2. Near complete interval resolution of vasogenic edema associated with the two previously described metastases. 3. Intracranial atherosclerotic change with mild narrowing of the distal right vertebral artery and mid basilar artery,  mild to moderate narrowing of the supraclinoid left ICA, and apparent moderate to severe proximal left PCA stenosis. However, there is some motion artifact which may account for some of this appearance. If clinically desired, repeat MRA imaging could be performed.   Electronically Signed   By: Logan Bores   On: 03/08/2013 14:48   Dg Chest Port 1 View  03/09/2013   CLINICAL DATA:  fever  EXAM: PORTABLE CHEST - 1 VIEW  COMPARISON:  March 08, 2013  FINDINGS: There is patchy interstitial infiltrate in the right base. Lungs are otherwise clear. Heart is enlarged with normal pulmonary vascularity. Aorta is prominent but stable. No adenopathy.  IMPRESSION: Patchy interstitial infiltrate right base.  Stable cardiomegaly.   Electronically Signed   By: Lowella Grip M.D.   On: 03/09/2013 08:09   Mr Jodene Nam Head/brain Wo Cm  03/08/2013   CLINICAL DATA:  Acute onset left-sided weakness. History of metastatic non-small cell lung cancer with recent brain radiation therapy.  EXAM: MRI HEAD WITHOUT CONTRAST  MRA HEAD WITHOUT  CONTRAST  TECHNIQUE: Multiplanar, multiecho pulse sequences of the brain and surrounding structures were obtained without intravenous contrast. Angiographic images of the head were obtained using MRA technique without contrast.  COMPARISON:  CT HEAD W/O CM dated 03/07/2013; MR HEAD WO/W CM dated 01/13/2013  FINDINGS: MRI HEAD FINDINGS  There is a small region of mildly restricted diffusion predominantly involving the posterior right frontal lobe and corona radiata with extension superiorly into the postcentral gyrus and inferiorly into the posterior insula. There is only faint associated T2 hyperintensity. Two small foci of restricted diffusion in the cerebellar hemispheres measure 10 mm on the right and 3 mm on the left. There is a small focus of susceptibility artifact associated with the diffusion abnormality in the right insula, compatible with a small amount of hemorrhage. Remote focus of hemorrhage in  the left parietal lobe is unchanged. There may be a tiny focus of microhemorrhage in the anterior right temporal lobe.  Vasogenic edema in the left parietal lobe associated with the previously described metastasis has nearly completely resolved. Left cerebellar vasogenic edema has completely resolved. Patchy foci of T2 hyperintensity in the subcortical and periventricular white matter are similar to the prior study and compatible with mild chronic small vessel ischemic disease. There is mild generalized cerebral atrophy. There is no midline shift or extra-axial fluid collection.  Orbits are unremarkable. There is trace right maxillary sinus mucosal thickening. Mastoid air cells are clear. Major intracranial vascular flow voids are preserved.  MRA HEAD FINDINGS  Visualized distal vertebral arteries are patent with the left being dominant. There is mild narrowing of the distal right vertebral artery at the vertebrobasilar junction. Left PICA origin is patent. Right PICA is not identified. AICA origins and SCA origins are patent. The basilar artery is mildly irregular diffusely with mild narrowing in its midportion. There is a moderate to severe focal stenosis of the left PCA near its origin. The left posterior communicating artery appears irregular with apparent narrowing distally, however this may be due to motion artifact. There is mild bilateral MCA irregularity.  Internal carotid arteries are patent from skullbase to carotid termini. There is mild bilateral carotid siphon irregularity. There is apparent mild to moderate narrowing of the distal left supraclinoid ICA, although this is also in a region of motion artifact. 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 Left A2 is supplied by the anterior communicating artery. There is mild bilateral proximal MCA irregularity without evidence of significant stenosis. No intracranial aneurysm is identified.  IMPRESSION: 1. New, mild diffusion abnormality in the right  frontoparietal region as well as small foci of diffusion abnormality in both cerebellar hemispheres. These findings are compatible with acute to early subacute infarcts, however new/underlying metastases cannot be excluded given the lack of IV contrast. 2. Near complete interval resolution of vasogenic edema associated with the two previously described metastases. 3. Intracranial atherosclerotic change with mild narrowing of the distal right vertebral artery and mid basilar artery, mild to moderate narrowing of the supraclinoid left ICA, and apparent moderate to severe proximal left PCA stenosis. However, there is some motion artifact which may account for some of this appearance. If clinically desired, repeat MRA imaging could be performed.   Electronically Signed   By: Logan Bores   On: 03/08/2013 14:48    Medications: Scheduled Meds: . amLODipine  10 mg Oral Daily  . aspirin  325 mg Oral Daily  . folic acid  1 mg Oral Daily  . hydrALAZINE  25 mg Oral 3  times per day  . levETIRAcetam  1,000 mg Oral BID  . lisinopril  5 mg Oral Daily  . pantoprazole  40 mg Oral Daily  . piperacillin-tazobactam (ZOSYN)  IV  3.375 g Intravenous 3 times per day      LOS: 2 days   RAI,RIPUDEEP M.D. [facility] 03/09/2013, 2:26 PM Pager: [phone]  If 7PM-7AM, please contact night-coverage [email or web address] Password [email or web address]

## 2013-03-09 NOTE — Progress Notes (Signed)
ANTIBIOTIC CONSULT NOTE - INITIAL  Pharmacy Consult for Zosyn Indication: aspiration PNA  No Known Allergies  Patient Measurements: Height: 6\' 2"  (188 cm) Weight: 165 lb (74.844 kg) IBW/kg (Calculated) : 82.2  Vital Signs: Temp: 100.1 F (37.8 C) (03/09 1203) Temp src: Oral (03/09 1203) BP: 126/69 mmHg (03/09 1203) Pulse Rate: 98 (03/09 1203) Intake/Output from previous day: 03/08 0701 - 03/09 0700 In: 1473.3 [I.V.:1473.3] Out: 1100 [Urine:1100] Intake/Output from this shift:    Labs:  Recent Labs  03/07/13 2136 03/07/13 2141 03/09/13 0300  WBC 4.0  --   --   HGB 8.1* 8.8*  --   PLT 50*  --   --   CREATININE 1.63* 1.90* 1.39*   Estimated Creatinine Clearance: 54.6 ml/min (by C-G formula based on Cr of 1.39). No results found for this basename: VANCOTROUGH, Corlis Leak, VANCORANDOM, [place], [place], GENTRANDOM, TOBRATROUGH, TOBRAPEAK, TOBRARND, AMIKACINPEAK, AMIKACINTROU, AMIKACIN,  in the last 72 hours   Microbiology: Recent Results (from the past 720 hour(s))  TECHNOLOGIST REVIEW     Status: None   Collection Time    02/24/13  8:20 AM      Result Value Ref Range Status   Technologist Review Occ Metas and Myelocytes present, few helmets   Final    Medical History: Past Medical History  Diagnosis Date  . Hypertension   . Lung cancer   . Hx of radiation therapy 01/19/13    left parietal, lt cerebellar    Assessment: 57 YOM with lung cancer and mets to brain undergoing palliative chemo being worked up for sudden weakness/possible stroke. To start Zosyn for possible aspiration pneumonia. WBC on 3/7 were 4, Tmax/24h 101.8. SCr 1.39, est CrCl ~21mL/min.  Goal of Therapy:  Eradication of infection  Plan:  1. Zosyn 3.375g IV q8h extended interval dosing 2. Follow palliative plans, LOT, clinical progression, and renal function  Wilfrid Hyser D. Alante Tolan, PharmD, BCPS Clinical Pharmacist Pager: [phone] 03/09/2013 2:00 PM

## 2013-03-09 NOTE — Telephone Encounter (Signed)
Pt's son [name] called wanting to let Dr Vista Mink know that pt was hospitalized and wanted to know if it was okay that pt was at [facility] and not [place].  Per Dr Vista Mink, it is good that [facility] is [facility] for his admitting dx of questionable stroke and Dr Vista Mink will f/u with pt on an outpt basis.  SLJ

## 2013-03-09 NOTE — Progress Notes (Signed)
Chart Note Carlos Beasley is a very pleasant 68 years old African American male with stage IV non-small cell lung cancer, adenocarcinoma with brain metastasis status post stereotactic radiotherapy to brain lesions and he was currently undergoing systemic chemotherapy with carboplatin and Alimta status post 2 cycles. The patient was admitted to [facility] with acute stroke and left-sided hemiparesis. His prognosis is very poor from the stroke according to Dr. Leonie Man. I told the patient's son and discussed the condition with him. I agree with palliative care and hospice referral at this point. I do think the patient would be a candidate for any further chemotherapy with his current condition unless he has significant improvement and resolution of his stroke which is unlikely. The son agreed to the current plan. Please call if you have any questions.

## 2013-03-09 NOTE — Care Management Note (Unsigned)
    Page 1 of 1   03/09/2013     2:59:06 PM   CARE MANAGEMENT NOTE 03/09/2013  Patient:  Carlos Beasley, Carlos Beasley   Account Number:  [id number]  Date Initiated:  03/09/2013  Documentation initiated by:  GRAVES-BIGELOW,Lillee Mooneyhan  Subjective/Objective Assessment:   Pt admitted for L sided weakness/ Lung Cancer.     Action/Plan:   Consult for palliative care has been placed. CM will continue to monitor for disposition needs.   Anticipated DC Date:  03/11/2013   Anticipated DC Plan:  [place]  In-house referral  Clinical Social Worker      DC Planning Services  CM consult      Choice offered to / List presented to:             Status of service:  In process, will continue to follow Medicare Important Message given?   (If response is "NO", the following Medicare IM given date fields will be blank) Date Medicare IM given:   Date Additional Medicare IM given:    Discharge Disposition:    Per UR Regulation:  Reviewed for med. necessity/level of care/duration of stay  If discussed at [place] of Stay Meetings, dates discussed:    Comments:

## 2013-03-09 NOTE — Evaluation (Signed)
Clinical/Bedside Swallow Evaluation Patient Details  Name: Carlos Beasley MRN: [id number] Date of Birth: 09/17/1945  Today's Date: 03/09/2013 Time: 4098-1191 SLP Time Calculation (min): 45 min  Past Medical History:  Past Medical History  Diagnosis Date  . Hypertension   . Lung cancer   . Hx of radiation therapy 01/19/13    left parietal, lt cerebellar   Past Surgical History:  Past Surgical History  Procedure Laterality Date  . No past surgeries     HPI:  Carlos Beasley is a 68 y.o. male with Metastatic Lung [place] Lung Cancer diagnosed in 01/2013  on Palliative Chemo Rx and recent Radiation Rx to the Brain who presents to the ED with complaints of sudden onset of Left sided weakness.  His symptoms started at about 9 pm.  He was seen in the ED as a Code Stroke and ws seen by Neurology Dr. Leonel Ramsay.  A CT scan of the Head was done and revealed  NO acute findings, and improvement in the vasogenic edema surrounding the metastasis of the left parasagital. posterior cortex.    He was referred for admission due to persistence of his left upper arm weakness with a diagnosis of a CVA.   He denies headache, chest pain and SOB.   Assessment / Plan / Recommendation Clinical Impression  Patient exhibits a moderate oropharyngeal dysphagia at bedside, with difficulty manipulating each bolus orally, delayed swallow initiation, and reduced laryngeal elevation palpated.  Pt. is unable to chew solids, and is at risk for aspiration of thin liquids.  OT reports pt's son was attempting to feed him with HOB only at 30 degrees.  Significant pocketing of food was noted.  RN advised to diet changes and aspiration precautions.  Pt. needs full supervision and assist with po's.    Aspiration Risk  Moderate    Diet Recommendation Dysphagia 1 (Puree);Nectar-thick liquid   Liquid Administration via: Cup Medication Administration: Whole meds with puree Supervision: Staff to assist with self feeding;Full  supervision/cueing for compensatory strategies Compensations: Slow rate;Small sips/bites;Check for pocketing;Check for anterior loss Postural Changes and/or Swallow Maneuvers: Seated upright 90 degrees    Other  Recommendations Oral Care Recommendations: Oral care Q4 per protocol;Staff/trained caregiver to provide oral care Other Recommendations: Order thickener from pharmacy;Prohibited food (jello, ice cream, thin soups);Have oral suction available;Clarify dietary restrictions   Follow Up Recommendations  Skilled Nursing facility;24 hour supervision/assistance    Frequency and Duration min 2x/week  2 weeks   Pertinent Vitals/Pain Fevers; congested LS        Swallow Study Prior Functional Status       General HPI: Carlos Beasley is a 68 y.o. male with Metastatic Lung [place] Lung Cancer diagnosed in 01/2013  on Palliative Chemo Rx and recent Radiation Rx to the Brain who presents to the ED with complaints of sudden onset of Left sided weakness.  His symptoms started at about 9 pm.  He was seen in the ED as a Code Stroke and ws seen by Neurology Dr. Leonel Ramsay.  A CT scan of the Head was done and revealed  NO acute findings, and improvement in the vasogenic edema surrounding the metastasis of the left parasagital. posterior cortex.    He was referred for admission due to persistence of his left upper arm weakness with a diagnosis of a CVA.   He denies headache, chest pain and SOB. Type of Study: Bedside swallow evaluation Previous Swallow Assessment: none Diet Prior to this Study: Regular;Thin liquids (Passed RN swallow  screen) Temperature Spikes Noted: Yes Respiratory Status: Nasal cannula History of Recent Intubation: No Behavior/Cognition: Lethargic;Distractible;Requires cueing;Decreased sustained attention Oral Cavity - Dentition: Poor condition;Missing dentition Self-Feeding Abilities: Total assist Patient Positioning: Upright in bed Baseline Vocal Quality: Hoarse;Breathy;Low vocal  intensity Volitional Cough: Cognitively unable to elicit Volitional Swallow: Able to elicit    Oral/Motor/Sensory Function Overall Oral Motor/Sensory Function: Impaired Labial ROM: Reduced left Labial Symmetry: Abnormal symmetry left Labial Strength: Reduced Labial Sensation: Reduced Lingual ROM: Reduced left Lingual Symmetry: Abnormal symmetry left Lingual Strength: Reduced Lingual Sensation: Reduced Facial ROM: Reduced left Facial Symmetry: Left droop Facial Strength: Reduced Facial Sensation: Reduced   Ice Chips Ice chips: Impaired Presentation: Spoon Oral Phase Impairments: Impaired mastication;Impaired anterior to posterior transit;Poor awareness of bolus;Reduced lingual movement/coordination Oral Phase Functional Implications: Prolonged oral transit;Oral holding;Oral residue Pharyngeal Phase Impairments: Suspected delayed Swallow;Throat Clearing - Immediate   Thin Liquid Thin Liquid: Impaired Presentation: Spoon;Cup;Straw Oral Phase Impairments: Reduced labial seal;Reduced lingual movement/coordination;Impaired anterior to posterior transit Oral Phase Functional Implications: Prolonged oral transit;Right anterior spillage Pharyngeal  Phase Impairments: Suspected delayed Swallow;Decreased hyoid-laryngeal movement;Multiple swallows;Throat Clearing - Delayed    Nectar Thick Nectar Thick Liquid: Impaired Presentation: Spoon;Cup Oral Phase Impairments: Reduced labial seal;Reduced lingual movement/coordination;Impaired anterior to posterior transit Oral phase functional implications: Prolonged oral transit;Oral residue Pharyngeal Phase Impairments: Suspected delayed Swallow;Multiple swallows   Honey Thick Honey Thick Liquid: Not tested   Puree Puree: Impaired Presentation: Spoon Oral Phase Impairments: Reduced lingual movement/coordination;Impaired anterior to posterior transit;Poor awareness of bolus Oral Phase Functional Implications: Prolonged oral transit;Oral  residue Pharyngeal Phase Impairments: Suspected delayed Swallow;Multiple swallows   Solid   GO    Solid: Not tested       Quinn Axe T 03/09/2013,2:59 PM

## 2013-03-10 ENCOUNTER — Other Ambulatory Visit: Payer: Medicare Other

## 2013-03-10 DIAGNOSIS — Z515 Encounter for palliative care: Secondary | ICD-10-CM

## 2013-03-10 DIAGNOSIS — R5381 Other malaise: Secondary | ICD-10-CM

## 2013-03-10 DIAGNOSIS — R5383 Other fatigue: Secondary | ICD-10-CM

## 2013-03-10 DIAGNOSIS — R531 Weakness: Secondary | ICD-10-CM | POA: Diagnosis present

## 2013-03-10 DIAGNOSIS — R1310 Dysphagia, unspecified: Secondary | ICD-10-CM | POA: Diagnosis present

## 2013-03-10 MED ORDER — MORPHINE SULFATE (CONCENTRATE) 10 MG /0.5 ML PO SOLN
5.0000 mg | ORAL | Status: DC | PRN
  Administered 2013-03-10 – 2013-03-15 (×12): 5 mg via ORAL
  Filled 2013-03-10 (×12): qty 0.5

## 2013-03-10 NOTE — Progress Notes (Signed)
Chaplain was present in a plan of care meeting with pt's 2 sons.  Chaplain offered support through his presence and by empathic listening.     03/10/13 1100  Clinical Encounter Type  Visited With Family;Patient and family together  Visit Type Spiritual support    Estelle June, chaplain pager [phone]

## 2013-03-10 NOTE — Progress Notes (Signed)
Late entry: pt had 25 bts NSVT.  Pt asymptomatic/denies chest pain/SOB or other complaints.  K.Kirby NP on call and notified. Will continue to monitor. Jessie Foot, RN

## 2013-03-10 NOTE — Progress Notes (Signed)
Speech Language Pathology Treatment: Dysphagia  Patient Details Name: Carlos Beasley MRN: [id number] DOB: Aug 19, 1945 Today's Date: 03/10/2013 Time: 5465-6812 SLP Time Calculation (min): 22 min  Assessment / Plan / Recommendation Clinical Impression  Pt. Continues to exhibit oral phase dysphagia with holding/pocketing of food and pharyngeally, a delayed swallow initiation.  He is currently on a Dysphagia 1 diet with nectar thick liquids.  Pt. May still aspirate, despite diet modifications and aspiration precautions and is currently a full code.  Oncology notes indicate Palliative Care Consult is pending.  Will defer Speech-Language Evaluation in light of pending PCT consult.  Pt. Actually does appear more alert and responsive today, and even told this SLP that [employer] [place] are currently playing in the [facility] tournament (game on in room).  Speech is dysarthric, but intelligible with careful listening.   HPI HPI: Carlos Beasley is a 68 y.o. male with Metastatic Lung [place] Lung Cancer diagnosed in 01/2013  on Palliative Chemo Rx and recent Radiation Rx to the Brain who presents to the ED with complaints of sudden onset of Left sided weakness.  His symptoms started at about 9 pm.  He was seen in the ED as a Code Stroke and ws seen by Neurology Dr. Leonel Ramsay.  A CT scan of the Head was done and revealed  NO acute findings, and improvement in the vasogenic edema surrounding the metastasis of the left parasagital. posterior cortex.    He was referred for admission due to persistence of his left upper arm weakness with a diagnosis of a CVA.   He denies headache, chest pain and SOB.   Pertinent Vitals CXR: Patchy infiltrate RLL; low grade temps; congested lung sounds  SLP Plan  Continue with current plan of care    Recommendations Diet recommendations: Nectar-thick liquid;Dysphagia 1 (puree) Liquids provided via: Cup;Straw Medication Administration: Whole meds with puree Supervision:  Staff to assist with self feeding;Full supervision/cueing for compensatory strategies Compensations: Slow rate;Small sips/bites;Check for pocketing;Check for anterior loss Postural Changes and/or Swallow Maneuvers: Seated upright 90 degrees              Plan: Continue with current plan of care.  Will f/u pending Palliative Care Consult re: [place].    GO     Quinn Axe T 03/10/2013, 1:55 PM

## 2013-03-10 NOTE — Progress Notes (Signed)
Dr. Lisbeth Renshaw,   Carlos Beasley was here picking up medications from Dr. Lew Dawes office. He had SRS on 01/19/13. He told [name] that he has been having headaches and nose bleeds. I talked to him and he said the headaches started a couple of days ago. He said they are on the top of his head. He also reported having nose bleeds once a day. Should we schedule him for a follow up appointment?   This note was sent via inbasket to Dr. Lisbeth Renshaw on 03/06/13.

## 2013-03-10 NOTE — Progress Notes (Signed)
Physical Therapy Treatment Patient Details Name: Carlos Beasley MRN: [id number] DOB: 1945/01/25 Today's Date: 03/10/2013    History of Present Illness 68 y.o. male admitted to [facility] on 03/07/13 with with Metastatic Lung [place] Lung Cancer diagnosed in 01/2013  on Palliative Chemo Rx and recent Radiation Rx to the Brain who presents to the ED with complaints of sudden onset of Left sided weakness. 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  Oncology now recommending Hospice referral    PT Comments    Pt alone and wanting to get OOB to chair due to reports of back pain. 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 Recommended to RN to use maxi-move for chair to bed transfer. Noted Oncology note re: Hospice referral. Will await decision re: Hospice. Pt will not be a therapy candidate if he goes to SNF with Hospice.   Follow Up Recommendations  SNF;Supervision/Assistance - 24 hour (if hospice involved, will not be a PT candidate at [facility])     Equipment Recommendations  Wheelchair (measurements PT);Wheelchair cushion (measurements PT);Hospital bed    Recommendations for Other Services      Precautions / Restrictions Precautions Precautions: Fall Precaution Comments: left hemiperesis; pushes to his [place]                       Prior Function             Pertinent Vitals/Pain no apparent distress   Mobility  Bed Mobility Overal bed mobility: Needs Assistance;+2 for physical assistance Bed Mobility: Supine to Sit     Supine to sit: Mod assist;+2 for physical assistance;HOB elevated     General bed mobility comments: HOB fully elevated and pivoted to his Rt side of bed; HOB remained elevated with foot of bed slightly elevated to encourage weight shift to Rt pelvis  Transfers Overall transfer level: Needs assistance Equipment used: 2 person hand held assist Transfers: Sit to/from [employer]  Transfers Sit to Stand: Max assist;+2 physical assistance;From elevated surface Stand pivot transfers: Max assist;+2 physical assistance;From elevated surface       General transfer comment: continues to push to his Left; stood x 20 sec with trunk flexion and lateral flexion to his Lt; attempted squat pivot to his Rt with pt able to reach across chair to far armrest. However once he initiated coming up into squat, he immediately fully extended his RLE, stepped it laterally into an abducted position and began to push to his Lt (away from the chair))  Ambulation/Gait                 Stairs            Wheelchair Mobility    Modified Rankin (Stroke Patients Only) Modified Rankin (Stroke Patients Only) Pre-Morbid Rankin Score: No significant disability Modified Rankin: Severe disability       Balance Overall balance assessment: Needs assistance Sitting-balance support: No upper extremity supported;Feet supported Sitting balance-Leahy Scale: Zero Sitting balance - Comments: pusher. L bias; with HOB fully elevated on his Rt, worked on Rt weight shift to lean his shoulder against mattress; pt able to relax in this position and stop pushing. With Rt hand holding Lt arm, he could then use his torso to right his trunk to midline and maintain up to 15 seconds (then begins leaning to his Lt). Able to follow instructions to come from his Lt to midline and even  to his Rt. Total EOB time 15 minutes  Postural control: Left lateral lean Standing balance support: Single extremity supported Standing balance-Leahy Scale: Zero                      Exercises        Cognition Arousal/Alertness: Lethargic Behavior During Therapy: Flat affect Overall Cognitive Status: Impaired/Different from baseline Area of Impairment: Attention;Following commands;Safety/judgement;Awareness;Problem solving   Current Attention Level: Sustained   Following Commands: Follows one step commands  inconsistently Safety/Judgement: Decreased awareness of safety;Decreased awareness of deficits Awareness:  (not yet intellectual, but did acknowledge LUE ) Problem Solving: Slow processing;Requires verbal cues;Requires tactile cues General Comments: able to attend to LUE by holding Lt forearm with Rt hand for up to 2 minutes    General Comments General comments (skin integrity, edema, etc.): Son briefly came into room after session initiated and then departed prior to end of session, therefore unable to discuss d/c plans.     PT Goals (current goals can now be found in the care plan section) Progress towards PT goals: Progressing toward goals    Frequency  Min 2X/week    PT Plan Discharge plan needs to be updated    End of Session Equipment Utilized During Treatment: Gait belt Activity Tolerance: Patient tolerated treatment well Patient left: in chair;with call bell/phone within reach;with chair alarm set;Other (comment) (SLP in room)     Time: 1311-1340 PT Time Calculation (min): 29 min  Charges:  $Therapeutic Activity: 8-22 mins $Neuromuscular Re-education: 8-22 mins                    G Codes:      Atreus Hasz 04-05-2013, 2:09 PM Pager [phone]

## 2013-03-10 NOTE — Progress Notes (Signed)
Patient ID: Carlos Beasley  male  TKZ:[id number]    DOB: 12/13/1945    DOA: 03/07/2013  PCP: Carlos Bales, MD  Assessment/Plan: Principal Problem: Left sided hemiparesis - acute CVA  - f/u MRI reveals multifocal acute/subacute infarcts in right fronto parietal and b/l cerebellar areas  - PT eval recommending inpatient rehabilitation - carotid ultrasound negative for stenosis  - ECHO negative for thrombus  - left intracranial ICA with mild to mod narrowing  - LDL 71 but choles 231, continue aspirin 325 mg daily,  - Palliative care consulted, had family meeting today.   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 - cont treatment per onc- palliative chemo and radiation  - previous brain mets show decreased vasogenic edema   Positive Troponin  - suspected to be troponin leak  - no further work up per Dr Einar Gip as pt is not a candidate for strong anticoagulants due to his cancerous masses   Aspiration pneumonia, UTI: Patient overnight was spiking fevers - Await urine cultures, blood cultures - Chest x-ray shows patchy infiltrate right base, likely aspiration pneumonia, started on Zosyn  ARF  - Continue gentle hydration, creatinine improving  Chronic systolic and diastolic CHF  - compensated   Cocaine abuse  Tobacco abuse   Seizure disorder  - cont Keppra   DVT Prophylaxis:  Code Status:  Family Communication: d/w patient's son, Carlos Beasley in detail.   Disposition: Family meeting held today with Palliative Care. We went over overall poor prognosis and limited treatment options and opened discussion goals of care. We went over the philosophy of palliative care and focusing on quality of life and management of symptoms. His two sons will think about options today and possibly discuss further with other family members. Follow up in AM.   Consultants:  Neurology  Cardiology  Palliative medicine consult today  Procedures:  None  Antibiotics:  IV Zosyn  3/9>>    Subjective: Patient seen and examined, left-sided weakness/hemiparesis  Objective: Weight change:  No intake or output data in the 24 hours ending 03/10/13 1655 Blood pressure 93/56, pulse 74, temperature 97.6 F (36.4 C), temperature source Axillary, resp. rate 20, height 6\' 2"  (1.88 m), weight 69.673 kg (153 lb 9.6 oz), SpO2 100.00%.  Physical Exam: General: Alert and awake, oriented x3, not in any acute distress. CVS: S1-S2 clear, no murmur rubs or gallops Chest: clear to auscultation bilaterally, no wheezing, rales or rhonchi Abdomen: soft nontender, nondistended, normal bowel sounds  Extremities: no cyanosis, clubbing or edema noted bilaterally Neuro: 5 x 5 strength on right side, left ue 2/5, LLE 3-4/5  Lab Results: Basic Metabolic Panel:  Recent Labs Lab 03/07/13 2136 03/07/13 2141 03/09/13 0300  NA 140 140 139  K 4.3 4.2 4.3  CL 101 104 100  CO2 26  --  24  GLUCOSE 101* 100* 77  BUN 34* 35* 23  CREATININE 1.63* 1.90* 1.39*  CALCIUM 9.0  --  8.4   Liver Function Tests:  Recent Labs Lab 03/07/13 2136  AST 25  ALT 59*  ALKPHOS 49  BILITOT <0.2*  PROT 6.6  ALBUMIN 3.2*   No results found for this basename: LIPASE, AMYLASE,  in the last 168 hours No results found for this basename: AMMONIA,  in the last 168 hours CBC:  Recent Labs Lab 03/07/13 2136 03/07/13 2141  WBC 4.0  --   NEUTROABS 2.6  --   HGB 8.1* 8.8*  HCT 24.5* 26.0*  MCV 89.7  --  PLT 50*  --    Cardiac Enzymes:  Recent Labs Lab 03/08/13 0530 03/08/13 1451 03/08/13 2155  TROPONINI 0.44* 0.41* 0.41*   BNP: No components found with this basename: POCBNP,  CBG: No results found for this basename: GLUCAP,  in the last 168 hours   Micro Results: Recent Results (from the past 240 hour(s))  URINE CULTURE     Status: None   Collection Time    03/09/13  1:18 AM      Result Value Ref Range Status   Specimen Description URINE, RANDOM   Final   Special Requests NONE    Final   Culture  Setup Time     Final   Value: 03/09/2013 11:48     Performed at [place] PENDING   Incomplete   Culture     Final   Value: Culture reincubated for better growth     Performed at [facility]   Report Status PENDING   Incomplete  CULTURE, BLOOD (ROUTINE X 2)     Status: None   Collection Time    03/09/13  3:00 AM      Result Value Ref Range Status   Specimen Description BLOOD RIGHT HAND   Final   Special Requests BOTTLES DRAWN AEROBIC ONLY [facility]   Final   Culture  Setup Time     Final   Value: 03/09/2013 08:44     Performed at [facility]   Culture     Final   Value:        BLOOD CULTURE RECEIVED NO GROWTH TO DATE CULTURE WILL BE HELD FOR 5 DAYS BEFORE ISSUING A FINAL NEGATIVE REPORT     Performed at [facility]   Report Status PENDING   Incomplete  CULTURE, BLOOD (ROUTINE X 2)     Status: None   Collection Time    03/09/13  3:16 AM      Result Value Ref Range Status   Specimen Description BLOOD RIGHT HAND   Final   Special Requests BOTTLES DRAWN AEROBIC ONLY 5CC   Final   Culture  Setup Time     Final   Value: 03/09/2013 08:46     Performed at [facility]   Culture     Final   Value:        BLOOD CULTURE RECEIVED NO GROWTH TO DATE CULTURE WILL BE HELD FOR 5 DAYS BEFORE ISSUING A FINAL NEGATIVE REPORT     Performed at [facility]   Report Status PENDING   Incomplete    Studies/Results: Dg Chest 2 View  03/08/2013   CLINICAL DATA:  Cerebral infarction and left upper extremity weakness. History of lung carcinoma.  EXAM: CHEST - 2 VIEW  COMPARISON:  DG CHEST 2 VIEW dated 02/21/2013; MR MRA HEAD W/O CM dated 03/08/2013; CT HEAD W/O CM dated 03/07/2013; NM PET IMAGE INITIAL (PI) SKULL BASE TO THIGH dated 01/26/2013; US BIOPSY dated 01/06/2013; CT CHEST W/CM dated 01/04/2013  FINDINGS: Stable cardiomegaly and mild chronic lung disease without overt edema, airspace consolidation or pleural effusion. No masses  are identified. The bony thorax is unremarkable.  IMPRESSION: Stable chronic lung disease and cardiomegaly.   Electronically Signed   By: Aletta Edouard M.D.   On: 03/08/2013 13:41   Dg Chest 2 View  02/21/2013   CLINICAL DATA:  Hypertension, history of metastatic lung cancer  EXAM: CHEST  2 VIEW  COMPARISON:  07/11/2012  FINDINGS:  Stable heart size and vascularity. Lungs remain clear. 7 mm right upper low spiculated density, faintly visualized by chest x-ray overlying the first anterior rib shadow. This is stable in size. No new superimposed pneumonia, collapse or consolidation. No edema, effusion or pneumothorax. Trachea midline.  IMPRESSION: Stable chest exam.  No superimposed acute process   Electronically Signed   By: Daryll Brod M.D.   On: 02/21/2013 12:20   Ct Head Wo Contrast  03/07/2013   CLINICAL DATA:  Code stroke, left face and extremity weakness. 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  EXAM: CT HEAD WITHOUT CONTRAST  TECHNIQUE: Contiguous axial images were obtained from the base of the skull through the vertex without contrast.  COMPARISON:  NM PET IMAGE INITIAL (PI) SKULL BASE TO THIGH dated 01/26/2013; MR HEAD WO/W CM dated 01/13/2013; MR HEAD WO/W CM dated 01/03/2013; CT HEAD W/O CM dated 01/02/2013  FINDINGS: Previous areas of vasogenic edema related to intracranial metastatic disease have largely resolved. Slight residual hypodensity in the left parasagittal posterior frontal cortex representing the involuting larger metastasis. No acute stroke, acute hemorrhage, significant mass lesion, hydrocephalus, or extra-axial fluid. Generalized atrophy. Small vessel disease. No fracture or osseous destructive lesion. Clear sinuses. No mastoid fluid.  IMPRESSION: Atrophy and small vessel disease.  No acute intracranial findings.  Critical Value/emergent results were called by telephone at the time of interpretation on 03/07/2013 at 9:52 PM to Stroke neurologist who verbally  acknowledged these results.   Electronically Signed   By: Rolla Flatten M.D.   On: 03/07/2013 20:56   Mr Brain Wo Contrast  03/08/2013   CLINICAL DATA:  Acute onset left-sided weakness. History of metastatic non-small cell lung cancer with recent brain radiation therapy.  EXAM: MRI HEAD WITHOUT CONTRAST  MRA HEAD WITHOUT CONTRAST  TECHNIQUE: Multiplanar, multiecho pulse sequences of the brain and surrounding structures were obtained without intravenous contrast. Angiographic images of the head were obtained using MRA technique without contrast.  COMPARISON:  CT HEAD W/O CM dated 03/07/2013; MR HEAD WO/W CM dated 01/13/2013  FINDINGS: MRI HEAD FINDINGS  There is a small region of mildly restricted diffusion predominantly involving the posterior right frontal lobe and corona radiata with extension superiorly into the postcentral gyrus and inferiorly into the posterior insula. There is only faint associated T2 hyperintensity. Two small foci of restricted diffusion in the cerebellar hemispheres measure 10 mm on the right and 3 mm on the left. There is a small focus of susceptibility artifact associated with the diffusion abnormality in the right insula, compatible with a small amount of hemorrhage. Remote focus of hemorrhage in the left parietal lobe is unchanged. There may be a tiny focus of microhemorrhage in the anterior right temporal lobe.  Vasogenic edema in the left parietal lobe associated with the previously described metastasis has nearly completely resolved. Left cerebellar vasogenic edema has completely resolved. Patchy foci of T2 hyperintensity in the subcortical and periventricular white matter are similar to the prior study and compatible with mild chronic small vessel ischemic disease. There is mild generalized cerebral atrophy. There is no midline shift or extra-axial fluid collection.  Orbits are unremarkable. There is trace right maxillary sinus mucosal thickening. Mastoid air cells are clear. Major  intracranial vascular flow voids are preserved.  MRA HEAD FINDINGS  Visualized distal vertebral arteries are patent with the left being dominant. There is mild narrowing of the distal right vertebral artery at the vertebrobasilar junction. Left PICA origin is patent. Right PICA is  not identified. AICA origins and SCA origins are patent. The basilar artery is mildly irregular diffusely with mild narrowing in its midportion. There is a moderate to severe focal stenosis of the left PCA near its origin. The left posterior communicating artery appears irregular with apparent narrowing distally, however this may be due to motion artifact. There is mild bilateral MCA irregularity.  Internal carotid arteries are patent from skullbase to carotid termini. There is mild bilateral carotid siphon irregularity. There is apparent mild to moderate narrowing of the distal left supraclinoid ICA, although this is also in a region of motion artifact. 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 Left A2 is supplied by the anterior communicating artery. There is mild bilateral proximal MCA irregularity without evidence of significant stenosis. No intracranial aneurysm is identified.  IMPRESSION: 1. New, mild diffusion abnormality in the right frontoparietal region as well as small foci of diffusion abnormality in both cerebellar hemispheres. These findings are compatible with acute to early subacute infarcts, however new/underlying metastases cannot be excluded given the lack of IV contrast. 2. Near complete interval resolution of vasogenic edema associated with the two previously described metastases. 3. Intracranial atherosclerotic change with mild narrowing of the distal right vertebral artery and mid basilar artery, mild to moderate narrowing of the supraclinoid left ICA, and apparent moderate to severe proximal left PCA stenosis. However, there is some motion artifact which may account for some of this appearance. If clinically  desired, repeat MRA imaging could be performed.   Electronically Signed   By: Logan Bores   On: 03/08/2013 14:48   Dg Chest Port 1 View  03/09/2013   CLINICAL DATA:  fever  EXAM: PORTABLE CHEST - 1 VIEW  COMPARISON:  March 08, 2013  FINDINGS: There is patchy interstitial infiltrate in the right base. Lungs are otherwise clear. Heart is enlarged with normal pulmonary vascularity. Aorta is prominent but stable. No adenopathy.  IMPRESSION: Patchy interstitial infiltrate right base.  Stable cardiomegaly.   Electronically Signed   By: Lowella Grip M.D.   On: 03/09/2013 08:09   Mr Jodene Nam Head/brain Wo Cm  03/08/2013   CLINICAL DATA:  Acute onset left-sided weakness. History of metastatic non-small cell lung cancer with recent brain radiation therapy.  EXAM: MRI HEAD WITHOUT CONTRAST  MRA HEAD WITHOUT CONTRAST  TECHNIQUE: Multiplanar, multiecho pulse sequences of the brain and surrounding structures were obtained without intravenous contrast. Angiographic images of the head were obtained using MRA technique without contrast.  COMPARISON:  CT HEAD W/O CM dated 03/07/2013; MR HEAD WO/W CM dated 01/13/2013  FINDINGS: MRI HEAD FINDINGS  There is a small region of mildly restricted diffusion predominantly involving the posterior right frontal lobe and corona radiata with extension superiorly into the postcentral gyrus and inferiorly into the posterior insula. There is only faint associated T2 hyperintensity. Two small foci of restricted diffusion in the cerebellar hemispheres measure 10 mm on the right and 3 mm on the left. There is a small focus of susceptibility artifact associated with the diffusion abnormality in the right insula, compatible with a small amount of hemorrhage. Remote focus of hemorrhage in the left parietal lobe is unchanged. There may be a tiny focus of microhemorrhage in the anterior right temporal lobe.  Vasogenic edema in the left parietal lobe associated with the previously described metastasis has  nearly completely resolved. Left cerebellar vasogenic edema has completely resolved. Patchy foci of T2 hyperintensity in the subcortical and periventricular white matter are similar to the prior study and  compatible with mild chronic small vessel ischemic disease. There is mild generalized cerebral atrophy. There is no midline shift or extra-axial fluid collection.  Orbits are unremarkable. There is trace right maxillary sinus mucosal thickening. Mastoid air cells are clear. Major intracranial vascular flow voids are preserved.  MRA HEAD FINDINGS  Visualized distal vertebral arteries are patent with the left being dominant. There is mild narrowing of the distal right vertebral artery at the vertebrobasilar junction. Left PICA origin is patent. Right PICA is not identified. AICA origins and SCA origins are patent. The basilar artery is mildly irregular diffusely with mild narrowing in its midportion. There is a moderate to severe focal stenosis of the left PCA near its origin. The left posterior communicating artery appears irregular with apparent narrowing distally, however this may be due to motion artifact. There is mild bilateral MCA irregularity.  Internal carotid arteries are patent from skullbase to carotid termini. There is mild bilateral carotid siphon irregularity. There is apparent mild to moderate narrowing of the distal left supraclinoid ICA, although this is also in a region of motion artifact. 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 Left A2 is supplied by the anterior communicating artery. There is mild bilateral proximal MCA irregularity without evidence of significant stenosis. No intracranial aneurysm is identified.  IMPRESSION: 1. New, mild diffusion abnormality in the right frontoparietal region as well as small foci of diffusion abnormality in both cerebellar hemispheres. These findings are compatible with acute to early subacute infarcts, however new/underlying metastases cannot be excluded  given the lack of IV contrast. 2. Near complete interval resolution of vasogenic edema associated with the two previously described metastases. 3. Intracranial atherosclerotic change with mild narrowing of the distal right vertebral artery and mid basilar artery, mild to moderate narrowing of the supraclinoid left ICA, and apparent moderate to severe proximal left PCA stenosis. However, there is some motion artifact which may account for some of this appearance. If clinically desired, repeat MRA imaging could be performed.   Electronically Signed   By: Logan Bores   On: 03/08/2013 14:48    Medications: Scheduled Meds: . amLODipine  10 mg Oral Daily  . aspirin  325 mg Oral Daily  . folic acid  1 mg Oral Daily  . hydrALAZINE  25 mg Oral 3 times per day  . levETIRAcetam  1,000 mg Oral BID  . lisinopril  5 mg Oral Daily  . pantoprazole  40 mg Oral Daily  . piperacillin-tazobactam (ZOSYN)  IV  3.375 g Intravenous 3 times per day      LOS: 3 days   Kelvin Cellar M.D. [facility] 03/10/2013, 4:55 PM Pager: [phone]  If 7PM-7AM, please contact night-coverage [email or web address] Password [email or web address]

## 2013-03-10 NOTE — Consult Note (Signed)
Patient NO:[facility] Goldman      DOB: 1945/09/14      NOB:[id number]     Consult Note from the Palliative Medicine Team at [place] Requested by: Dr Coralyn Pear     PCP: [name], MD Reason for Consultation:Clarifiction of [place] and otpions     Phone [phone]  Assessment of patients Current state:  Carlos Beasley is a 68 y.o. male with Metastatic Lung [place] Lung Cancer diagnosed in 01/2013 on Palliative Chemo Rx and recent Radiation Rx to the Brain who presents to the ED with complaints of sudden onset of Left sided weakness.  He was seen in the ED as a Code Stroke and ws seen by Neurology Dr. Leonel Ramsay. Initial  CT scan of the Head was done and revealed NO acute findings, and improvement in the vasogenic edema surrounding the metastasis of the left parasagital. posterior cortex. He was referred for admission due to persistence of his left upper arm weakness with a diagnosis of a CVA. He has a h/o polysubstance abuse, specific to crack cocaine   Family faced with advanced care decisions and anticipatory care needs  Consult is for review of medical treatment options, clarification of goals of care and end of life issues, disposition and options, and symptom recommendation.  This NP Carlos Beasley reviewed medical records, received report from team, assessed the patient and then meet at the patient's bedside along with his two sons, Carlos Beasley [phone], Carlos Beasley # [phone]  to discuss diagnosis prognosis, GOC, EOL wishes disposition and options.   A detailed discussion was had today regarding advanced directives.  Concepts specific to code status, artifical feeding and hydration, continued IV antibiotics and rehospitalization was had.  The difference between a aggressive medical intervention path  and a palliative comfort care path for this patient at this time was had.  Values and goals of care important to patient and family were attempted to be  elicited.  Concept of Hospice and Palliative Care were discussed  Natural trajectory and expectations at EOL were discussed.  Questions and concerns addressed.  Hard Choices booklet left for review. Family encouraged to call with questions or concerns.  PMT will continue to support holistically.   Goals of Care: 1.  Code Status:  FULL   2. Scope of Treatment: Family wishes to continue with the present treatment plan until the morning.  They plan to further process the situation tonight and clarify decisions with this NP in the morning.    4. Disposition:  Dependant on outcomes   3. Symptom Management:   1. Weakness: PT/OT as tolerated  2. Pain: Roxanol 5 mg PO every 4 hrs prn  4. Psychosocial:  Emotional support offered to both sons.  They both live out of town making this more difficult to navigate.  Also the patient's father is declining physically, functionally and cognitively and the the family is working on placement for him at this same time.  5. Spiritual:  Chaplain presetn at bedside for [place] meeting   Patient Documents Completed or Given: Document Given Completed  Advanced Directives Pkt    MOST yes   DNR    Gone from My Sight    Hard Choices yes     Brief HPI: Carlos Beasley is a 68 y.o. male with Metastatic Lung [place] Lung Cancer diagnosed in 01/2013 on Palliative Chemo Rx and recent Radiation Rx to the Brain who presents to the ED with complaints of sudden onset  of Left sided weakness.  He was seen in the ED as a Code Stroke and ws seen by Neurology Dr. Leonel Ramsay. A CT scan of the Head was done and revealed NO acute findings, and improvement in the vasogenic edema surrounding the metastasis of the left parasagital. posterior cortex.  f/u MRI reveals multifocal acute/subacute infarcts in right fronto parietal and b/l cerebellar areas.  Likely aspiration Pneumonia with elevated temps  He was referred for admission due to persistence of his left upper arm weakness with  a diagnosis of a CVA. He denies headache, chest pain and SOB.  He has a h/o polysubstance abuse, specific to crack cocaine   ROS: unable to illicit     PMH:  Past Medical History  Diagnosis Date  . Hypertension   . Lung cancer   . Hx of radiation therapy 01/19/13    left parietal, lt cerebellar     PSH: Past Surgical History  Procedure Laterality Date  . No past surgeries     I have reviewed the [place] and SH and  If appropriate update it with new information. No Known Allergies Scheduled Meds: . amLODipine  10 mg Oral Daily  . aspirin  325 mg Oral Daily  . folic acid  1 mg Oral Daily  . hydrALAZINE  25 mg Oral 3 times per day  . levETIRAcetam  1,000 mg Oral BID  . lisinopril  5 mg Oral Daily  . pantoprazole  40 mg Oral Daily  . piperacillin-tazobactam (ZOSYN)  IV  3.375 g Intravenous 3 times per day   Continuous Infusions: . sodium chloride 100 mL/hr at 03/08/13 1016   PRN Meds:.prochlorperazine, RESOURCE THICKENUP CLEAR, senna-docusate    BP 119/74  Pulse 95  Temp(Src) 98.4 F (36.9 C) (Oral)  Resp 20  Ht 6\' 2"  (1.88 m)  Wt 69.673 kg (153 lb 9.6 oz)  BMI 19.71 kg/m2  SpO2 97%   PPS: 30 %   No intake or output data in the 24 hours ending 03/10/13 1328   Physical Exam:  General: alert, follows simple commands, NAD HEENT:  Mm, no exudate Chest:   CTA CVS: RRR Abdomen:soft NT +BS Ext: without edeam Neuro:left sided weakness, garbled speech  Labs: CBC    Component Value Date/Time   WBC 4.0 03/07/2013 2136   WBC 2.0* 03/03/2013 0806   RBC 2.73* 03/07/2013 2136   RBC 3.19* 03/03/2013 0806   HGB 8.8* 03/07/2013 2141   HGB 9.5* 03/03/2013 0806   HCT 26.0* 03/07/2013 2141   HCT 29.2* 03/03/2013 0806   PLT 50* 03/07/2013 2136   PLT 75* 03/03/2013 0806   MCV 89.7 03/07/2013 2136   MCV 91.6 03/03/2013 0806   MCH 29.7 03/07/2013 2136   MCH 29.9 03/03/2013 0806   MCHC 33.1 03/07/2013 2136   MCHC 32.7 03/03/2013 0806   RDW 16.7* 03/07/2013 2136   RDW 16.8* 03/03/2013 0806    LYMPHSABS 0.8 03/07/2013 2136   LYMPHSABS 0.7* 03/03/2013 0806   MONOABS 0.6 03/07/2013 2136   MONOABS 0.2 03/03/2013 0806   EOSABS 0.0 03/07/2013 2136   EOSABS 0.0 03/03/2013 0806   BASOSABS 0.0 03/07/2013 2136   BASOSABS 0.0 03/03/2013 0806    BMET    Component Value Date/Time   NA 139 03/09/2013 0300   NA 145 03/03/2013 0806   K 4.3 03/09/2013 0300   K 4.3 03/03/2013 0806   CL 100 03/09/2013 0300   CO2 24 03/09/2013 0300   CO2 29 03/03/2013 0806   GLUCOSE  77 03/09/2013 0300   GLUCOSE 137 03/03/2013 0806   BUN 23 03/09/2013 0300   BUN 24.7 03/03/2013 0806   CREATININE 1.39* 03/09/2013 0300   CREATININE 1.2 03/03/2013 0806   CALCIUM 8.4 03/09/2013 0300   CALCIUM 9.2 03/03/2013 0806   GFRNONAA 51* 03/09/2013 0300   GFRAA 59* 03/09/2013 0300    CMP     Component Value Date/Time   NA 139 03/09/2013 0300   NA 145 03/03/2013 0806   K 4.3 03/09/2013 0300   K 4.3 03/03/2013 0806   CL 100 03/09/2013 0300   CO2 24 03/09/2013 0300   CO2 29 03/03/2013 0806   GLUCOSE 77 03/09/2013 0300   GLUCOSE 137 03/03/2013 0806   BUN 23 03/09/2013 0300   BUN 24.7 03/03/2013 0806   CREATININE 1.39* 03/09/2013 0300   CREATININE 1.2 03/03/2013 0806   CALCIUM 8.4 03/09/2013 0300   CALCIUM 9.2 03/03/2013 0806   PROT 6.6 03/07/2013 2136   PROT 6.8 03/03/2013 0806   ALBUMIN 3.2* 03/07/2013 2136   ALBUMIN 3.2* 03/03/2013 0806   AST 25 03/07/2013 2136   AST 30 03/03/2013 0806   ALT 59* 03/07/2013 2136   ALT 90* 03/03/2013 0806   ALKPHOS 49 03/07/2013 2136   ALKPHOS 68 03/03/2013 0806   BILITOT <0.2* 03/07/2013 2136   BILITOT 0.28 03/03/2013 0806   GFRNONAA 51* 03/09/2013 0300   GFRAA 59* 03/09/2013 0300       Time In Time Out Total Time Spent with Patient Total Overall Time  0930 1100 80 min 90 min    Greater than 50%  of this time was spent counseling and coordinating care related to the above assessment and plan.  Carlos Lessen NP  Palliative Medicine Team Team Phone # [phone] Pager [phone]  Discussed with Dr Coralyn Pear  Will f/u with family in the morning.

## 2013-03-11 ENCOUNTER — Ambulatory Visit: Payer: Medicare Other | Admitting: Radiation Oncology

## 2013-03-11 DIAGNOSIS — C7949 Secondary malignant neoplasm of other parts of nervous system: Secondary | ICD-10-CM

## 2013-03-11 DIAGNOSIS — R1310 Dysphagia, unspecified: Secondary | ICD-10-CM

## 2013-03-11 DIAGNOSIS — C7931 Secondary malignant neoplasm of brain: Secondary | ICD-10-CM

## 2013-03-11 LAB — URINE CULTURE: Colony Count: 30000

## 2013-03-11 MED ORDER — ACETAMINOPHEN 325 MG PO TABS
650.0000 mg | ORAL_TABLET | Freq: Four times a day (QID) | ORAL | Status: DC | PRN
  Administered 2013-03-12 – 2013-03-15 (×3): 650 mg via ORAL
  Filled 2013-03-11 (×4): qty 2

## 2013-03-11 NOTE — Progress Notes (Signed)
Progress Note from the Palliative Medicine Team at [place]:  patient is lethargic and appears generally uncomfortable   -spoke to son [name] via telephone.  He tells me that his family is in the process of making a decision regarding full comfort vs a more curative approach.  They hope to make that descion this week-end when both are in town again.  I encouraged that that decision be made sooner in order to begin finding a discharge plan that best meets their GOCs for their father.    -I update him on current condition and decision for DNR/DNI was made  Objective: No Known Allergies Scheduled Meds: . amLODipine  10 mg Oral Daily  . aspirin  325 mg Oral Daily  . folic acid  1 mg Oral Daily  . hydrALAZINE  25 mg Oral 3 times per day  . levETIRAcetam  1,000 mg Oral BID  . lisinopril  5 mg Oral Daily  . pantoprazole  40 mg Oral Daily  . piperacillin-tazobactam (ZOSYN)  IV  3.375 g Intravenous 3 times per day   Continuous Infusions: . sodium chloride 100 mL/hr at 03/10/13 2113   PRN Meds:.morphine CONCENTRATE, prochlorperazine, RESOURCE THICKENUP CLEAR, senna-docusate  BP 118/63  Pulse 99  Temp(Src) 97.7 F (36.5 C) (Oral)  Resp 20  Ht 6\' 2"  (1.88 m)  Wt 69.673 kg (153 lb 9.6 oz)  BMI 19.71 kg/m2  SpO2 100%   PPS:30 % at best  Pain Score:denies    Intake/Output Summary (Last 24 hours) at 03/11/13 0925 Last data filed at 03/11/13 0817  Gross per 24 hour  Intake 6076.33 ml  Output    500 ml  Net 5576.33 ml       Physical Exam:  General: alert, follows simple commands, NAD  HEENT: Mm, no exudate  Chest: CTA  CVS: RRR  Abdomen:soft NT +BS  Ext: without edeam  Neuro:left sided weakness, garbled speech  Labs: CBC    Component Value Date/Time   WBC 4.0 03/07/2013 2136   WBC 2.0* 03/03/2013 0806   RBC 2.73* 03/07/2013 2136   RBC 3.19* 03/03/2013 0806   HGB 8.8* 03/07/2013 2141   HGB 9.5* 03/03/2013 0806   HCT 26.0* 03/07/2013 2141   HCT 29.2* 03/03/2013 0806   PLT 50* 03/07/2013 2136   PLT 75* 03/03/2013 0806   MCV 89.7 03/07/2013 2136   MCV 91.6 03/03/2013 0806   MCH 29.7 03/07/2013 2136   MCH 29.9 03/03/2013 0806   MCHC 33.1 03/07/2013 2136   MCHC 32.7 03/03/2013 0806   RDW 16.7* 03/07/2013 2136   RDW 16.8* 03/03/2013 0806   LYMPHSABS 0.8 03/07/2013 2136   LYMPHSABS 0.7* 03/03/2013 0806   MONOABS 0.6 03/07/2013 2136   MONOABS 0.2 03/03/2013 0806   EOSABS 0.0 03/07/2013 2136   EOSABS 0.0 03/03/2013 0806   BASOSABS 0.0 03/07/2013 2136   BASOSABS 0.0 03/03/2013 0806    BMET    Component Value Date/Time   NA 139 03/09/2013 0300   NA 145 03/03/2013 0806   K 4.3 03/09/2013 0300   K 4.3 03/03/2013 0806   CL 100 03/09/2013 0300   CO2 24 03/09/2013 0300   CO2 29 03/03/2013 0806   GLUCOSE 77 03/09/2013 0300   GLUCOSE 137 03/03/2013 0806   BUN 23 03/09/2013 0300   BUN 24.7 03/03/2013 0806   CREATININE 1.39* 03/09/2013 0300   CREATININE 1.2 03/03/2013 0806   CALCIUM 8.4 03/09/2013 0300   CALCIUM 9.2 03/03/2013 0806   GFRNONAA 51*  03/09/2013 0300   GFRAA 59* 03/09/2013 0300    CMP     Component Value Date/Time   NA 139 03/09/2013 0300   NA 145 03/03/2013 0806   K 4.3 03/09/2013 0300   K 4.3 03/03/2013 0806   CL 100 03/09/2013 0300   CO2 24 03/09/2013 0300   CO2 29 03/03/2013 0806   GLUCOSE 77 03/09/2013 0300   GLUCOSE 137 03/03/2013 0806   BUN 23 03/09/2013 0300   BUN 24.7 03/03/2013 0806   CREATININE 1.39* 03/09/2013 0300   CREATININE 1.2 03/03/2013 0806   CALCIUM 8.4 03/09/2013 0300   CALCIUM 9.2 03/03/2013 0806   PROT 6.6 03/07/2013 2136   PROT 6.8 03/03/2013 0806   ALBUMIN 3.2* 03/07/2013 2136   ALBUMIN 3.2* 03/03/2013 0806   AST 25 03/07/2013 2136   AST 30 03/03/2013 0806   ALT 59* 03/07/2013 2136   ALT 90* 03/03/2013 0806   ALKPHOS 49 03/07/2013 2136   ALKPHOS 68 03/03/2013 0806   BILITOT <0.2* 03/07/2013 2136   BILITOT 0.28 03/03/2013 0806   GFRNONAA 51* 03/09/2013 0300   GFRAA 59* 03/09/2013 0300   1  Assessment and Plan: 1. Code Status:  DNR/DNI   2. Symptom Control: 1. Weakness: PT/OT as tolerated  2. Pain: Roxanol 5  mg PO every 4 hrs pr  3. Psycho/Social: Emotional support offered to son  4. Disposition:  Pending family decsion  Patient Documents Completed or Given: Document Given Completed  Advanced Directives Pkt    MOST yes   DNR    Gone from My Sight    Hard Choices yes     Time In Time Out Total Time Spent with Patient Total Overall Time  0915 0950 35 min 35 min    Greater than 50%  of this time was spent counseling and coordinating care related to the above assessment and plan.  Wadie Lessen NP  Palliative Medicine Team Team Phone # [phone] Pager [phone]  Discussed with Dr Algis Liming and nursing staff 1

## 2013-03-11 NOTE — Progress Notes (Addendum)
Patient ID: Carlos Beasley  male  JQB:[id number]    DOB: April 01, 1945    DOA: 03/07/2013  PCP: [name], MD  Assessment/Plan: Principal Problem: Left sided hemiparesis - acute CVA  - f/u MRI reveals multifocal acute/subacute infarcts in right fronto parietal and b/l cerebellar areas  - PT eval recommending inpatient rehabilitation - carotid ultrasound negative for stenosis  - ECHO negative for thrombus  - left intracranial ICA with mild to mod narrowing  - LDL 71 but choles 231, continue aspirin 325 mg daily,  - Palliative care consulted, had family meeting 3/10 and plan to meet again 3/11  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 - As per his oncologist: stage IV non-small cell lung cancer, adenocarcinoma with brain metastasis status post stereotactic radiotherapy to brain lesions and he was currently undergoing systemic chemotherapy with carboplatin and Alimta status post 2 cycles. Dr. Julien Nordmann agreed with palliative care and hospice referral and did not think that the patient would be a candidate for any further chemotherapy unless he made significant improvement. - previous brain mets show decreased vasogenic edema   Positive Troponin  - suspected to be troponin leak  - no further work up per Dr Einar Gip as pt is not a candidate for strong anticoagulants due to his cancerous masses   Aspiration pneumonia, UTI - Urine culture suggests contamination and blood cultures x2: Negative to date - Chest x-ray shows patchy infiltrate right base, likely aspiration pneumonia, started on Zosyn  ARF  - Continue gentle hydration, creatinine improving  Anemia and thrombocytopenia - Likely secondary to chronic disease and chemotherapy. Follow CBCs  Dysphagia - Continue D1 diet.  Chronic systolic and diastolic CHF  - compensated   Cocaine abuse  Tobacco abuse   Seizure disorder  - cont Keppra   DVT Prophylaxis:  Code Status: Full  Family Communication: None at  bedside.   Disposition: Pending followup by palliative care team and recommendations.   Consultants:  Neurology  Cardiology  Palliative medicine consult today  Procedures:  None  Antibiotics:  IV Zosyn 3/9>>   Subjective: Headache  Objective: Weight change:   Intake/Output Summary (Last 24 hours) at 03/11/13 1532 Last data filed at 03/11/13 1226  Gross per 24 hour  Intake 6101.33 ml  Output    950 ml  Net 5151.33 ml   Blood pressure 104/56, pulse 77, temperature 97.6 F (36.4 C), temperature source Oral, resp. rate 20, height 6\' 2"  (1.88 m), weight 69.673 kg (153 lb 9.6 oz), SpO2 95.00%.  Physical Exam: General: Alert and awake, oriented x3, not in any acute distress.  CVS: S1-S2 clear, no murmur rubs or gallops Chest: Reduced breath sounds in the bases, right >  left with occasional basal crackles. Rest clear. No increased work of breathing. Abdomen: soft nontender, nondistended, normal bowel sounds  Extremities: no cyanosis, clubbing or edema noted bilaterally Neuro: 5 x 5 strength on right side, left ue 2/5, LLE 3-4/5  Lab Results: Basic Metabolic Panel:  Recent Labs Lab 03/07/13 2136 03/07/13 2141 03/09/13 0300  NA 140 140 139  K 4.3 4.2 4.3  CL 101 104 100  CO2 26  --  24  GLUCOSE 101* 100* 77  BUN 34* 35* 23  CREATININE 1.63* 1.90* 1.39*  CALCIUM 9.0  --  8.4   Liver Function Tests:  Recent Labs Lab 03/07/13 2136  AST 25  ALT 59*  ALKPHOS 49  BILITOT <0.2*  PROT 6.6  ALBUMIN 3.2*  No results found for this basename: LIPASE, AMYLASE,  in the last 168 hours No results found for this basename: AMMONIA,  in the last 168 hours CBC:  Recent Labs Lab 03/07/13 2136 03/07/13 2141  WBC 4.0  --   NEUTROABS 2.6  --   HGB 8.1* 8.8*  HCT 24.5* 26.0*  MCV 89.7  --   PLT 50*  --    Cardiac Enzymes:  Recent Labs Lab 03/08/13 0530 03/08/13 1451 03/08/13 2155  TROPONINI 0.44* 0.41* 0.41*   BNP: No components found with this  basename: POCBNP,  CBG: No results found for this basename: GLUCAP,  in the last 168 hours   Micro Results: Recent Results (from the past 240 hour(s))  URINE CULTURE     Status: None   Collection Time    03/09/13  1:18 AM      Result Value Ref Range Status   Specimen Description URINE, RANDOM   Final   Special Requests NONE   Final   Culture  Setup Time     Final   Value: 03/09/2013 11:48     Performed at [place]     Final   Value: 30,000 COLONIES/ML     Performed at [facility]   Culture     Final   Value: Multiple bacterial morphotypes present, none predominant. Suggest appropriate recollection if clinically indicated.     Performed at [facility]   Report Status 03/11/2013 FINAL   Final  CULTURE, BLOOD (ROUTINE X 2)     Status: None   Collection Time    03/09/13  3:00 AM      Result Value Ref Range Status   Specimen Description BLOOD RIGHT HAND   Final   Special Requests BOTTLES DRAWN AEROBIC ONLY [facility]   Final   Culture  Setup Time     Final   Value: 03/09/2013 08:44     Performed at [facility]   Culture     Final   Value:        BLOOD CULTURE RECEIVED NO GROWTH TO DATE CULTURE WILL BE HELD FOR 5 DAYS BEFORE ISSUING A FINAL NEGATIVE REPORT     Performed at [facility]   Report Status PENDING   Incomplete  CULTURE, BLOOD (ROUTINE X 2)     Status: None   Collection Time    03/09/13  3:16 AM      Result Value Ref Range Status   Specimen Description BLOOD RIGHT HAND   Final   Special Requests BOTTLES DRAWN AEROBIC ONLY 5CC   Final   Culture  Setup Time     Final   Value: 03/09/2013 08:46     Performed at [facility]   Culture     Final   Value:        BLOOD CULTURE RECEIVED NO GROWTH TO DATE CULTURE WILL BE HELD FOR 5 DAYS BEFORE ISSUING A FINAL NEGATIVE REPORT     Performed at [facility]   Report Status PENDING   Incomplete    Studies/Results: Dg Chest 2 View  03/08/2013   CLINICAL  DATA:  Cerebral infarction and left upper extremity weakness. History of lung carcinoma.  EXAM: CHEST - 2 VIEW  COMPARISON:  DG CHEST 2 VIEW dated 02/21/2013; MR MRA HEAD W/O CM dated 03/08/2013; CT HEAD W/O CM dated 03/07/2013; NM PET IMAGE INITIAL (PI) SKULL BASE TO THIGH dated 01/26/2013; US BIOPSY dated 01/06/2013;  CT CHEST W/CM dated 01/04/2013  FINDINGS: Stable cardiomegaly and mild chronic lung disease without overt edema, airspace consolidation or pleural effusion. No masses are identified. The bony thorax is unremarkable.  IMPRESSION: Stable chronic lung disease and cardiomegaly.   Electronically Signed   By: Aletta Edouard M.D.   On: 03/08/2013 13:41   Dg Chest 2 View  02/21/2013   CLINICAL DATA:  Hypertension, history of metastatic lung cancer  EXAM: CHEST  2 VIEW  COMPARISON:  07/11/2012  FINDINGS: Stable heart size and vascularity. Lungs remain clear. 7 mm right upper low spiculated density, faintly visualized by chest x-ray overlying the first anterior rib shadow. This is stable in size. No new superimposed pneumonia, collapse or consolidation. No edema, effusion or pneumothorax. Trachea midline.  IMPRESSION: Stable chest exam.  No superimposed acute process   Electronically Signed   By: Daryll Brod M.D.   On: 02/21/2013 12:20   Ct Head Wo Contrast  03/07/2013   CLINICAL DATA:  Code stroke, left face and extremity weakness. 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  EXAM: CT HEAD WITHOUT CONTRAST  TECHNIQUE: Contiguous axial images were obtained from the base of the skull through the vertex without contrast.  COMPARISON:  NM PET IMAGE INITIAL (PI) SKULL BASE TO THIGH dated 01/26/2013; MR HEAD WO/W CM dated 01/13/2013; MR HEAD WO/W CM dated 01/03/2013; CT HEAD W/O CM dated 01/02/2013  FINDINGS: Previous areas of vasogenic edema related to intracranial metastatic disease have largely resolved. Slight residual hypodensity in the left parasagittal posterior frontal cortex representing the  involuting larger metastasis. No acute stroke, acute hemorrhage, significant mass lesion, hydrocephalus, or extra-axial fluid. Generalized atrophy. Small vessel disease. No fracture or osseous destructive lesion. Clear sinuses. No mastoid fluid.  IMPRESSION: Atrophy and small vessel disease.  No acute intracranial findings.  Critical Value/emergent results were called by telephone at the time of interpretation on 03/07/2013 at 9:52 PM to Stroke neurologist who verbally acknowledged these results.   Electronically Signed   By: Rolla Flatten M.D.   On: 03/07/2013 20:56   Mr Brain Wo Contrast  03/08/2013   CLINICAL DATA:  Acute onset left-sided weakness. History of metastatic non-small cell lung cancer with recent brain radiation therapy.  EXAM: MRI HEAD WITHOUT CONTRAST  MRA HEAD WITHOUT CONTRAST  TECHNIQUE: Multiplanar, multiecho pulse sequences of the brain and surrounding structures were obtained without intravenous contrast. Angiographic images of the head were obtained using MRA technique without contrast.  COMPARISON:  CT HEAD W/O CM dated 03/07/2013; MR HEAD WO/W CM dated 01/13/2013  FINDINGS: MRI HEAD FINDINGS  There is a small region of mildly restricted diffusion predominantly involving the posterior right frontal lobe and corona radiata with extension superiorly into the postcentral gyrus and inferiorly into the posterior insula. There is only faint associated T2 hyperintensity. Two small foci of restricted diffusion in the cerebellar hemispheres measure 10 mm on the right and 3 mm on the left. There is a small focus of susceptibility artifact associated with the diffusion abnormality in the right insula, compatible with a small amount of hemorrhage. Remote focus of hemorrhage in the left parietal lobe is unchanged. There may be a tiny focus of microhemorrhage in the anterior right temporal lobe.  Vasogenic edema in the left parietal lobe associated with the previously described metastasis has nearly completely  resolved. Left cerebellar vasogenic edema has completely resolved. Patchy foci of T2 hyperintensity in the subcortical and periventricular white matter are similar to the prior study  and compatible with mild chronic small vessel ischemic disease. There is mild generalized cerebral atrophy. There is no midline shift or extra-axial fluid collection.  Orbits are unremarkable. There is trace right maxillary sinus mucosal thickening. Mastoid air cells are clear. Major intracranial vascular flow voids are preserved.  MRA HEAD FINDINGS  Visualized distal vertebral arteries are patent with the left being dominant. There is mild narrowing of the distal right vertebral artery at the vertebrobasilar junction. Left PICA origin is patent. Right PICA is not identified. AICA origins and SCA origins are patent. The basilar artery is mildly irregular diffusely with mild narrowing in its midportion. There is a moderate to severe focal stenosis of the left PCA near its origin. The left posterior communicating artery appears irregular with apparent narrowing distally, however this may be due to motion artifact. There is mild bilateral MCA irregularity.  Internal carotid arteries are patent from skullbase to carotid termini. There is mild bilateral carotid siphon irregularity. There is apparent mild to moderate narrowing of the distal left supraclinoid ICA, although this is also in a region of motion artifact. 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 Left A2 is supplied by the anterior communicating artery. There is mild bilateral proximal MCA irregularity without evidence of significant stenosis. No intracranial aneurysm is identified.  IMPRESSION: 1. New, mild diffusion abnormality in the right frontoparietal region as well as small foci of diffusion abnormality in both cerebellar hemispheres. These findings are compatible with acute to early subacute infarcts, however new/underlying metastases cannot be excluded given the lack of  IV contrast. 2. Near complete interval resolution of vasogenic edema associated with the two previously described metastases. 3. Intracranial atherosclerotic change with mild narrowing of the distal right vertebral artery and mid basilar artery, mild to moderate narrowing of the supraclinoid left ICA, and apparent moderate to severe proximal left PCA stenosis. However, there is some motion artifact which may account for some of this appearance. If clinically desired, repeat MRA imaging could be performed.   Electronically Signed   By: Logan Bores   On: 03/08/2013 14:48   Dg Chest Port 1 View  03/09/2013   CLINICAL DATA:  fever  EXAM: PORTABLE CHEST - 1 VIEW  COMPARISON:  March 08, 2013  FINDINGS: There is patchy interstitial infiltrate in the right base. Lungs are otherwise clear. Heart is enlarged with normal pulmonary vascularity. Aorta is prominent but stable. No adenopathy.  IMPRESSION: Patchy interstitial infiltrate right base.  Stable cardiomegaly.   Electronically Signed   By: Lowella Grip M.D.   On: 03/09/2013 08:09   Mr Jodene Nam Head/brain Wo Cm  03/08/2013   CLINICAL DATA:  Acute onset left-sided weakness. History of metastatic non-small cell lung cancer with recent brain radiation therapy.  EXAM: MRI HEAD WITHOUT CONTRAST  MRA HEAD WITHOUT CONTRAST  TECHNIQUE: Multiplanar, multiecho pulse sequences of the brain and surrounding structures were obtained without intravenous contrast. Angiographic images of the head were obtained using MRA technique without contrast.  COMPARISON:  CT HEAD W/O CM dated 03/07/2013; MR HEAD WO/W CM dated 01/13/2013  FINDINGS: MRI HEAD FINDINGS  There is a small region of mildly restricted diffusion predominantly involving the posterior right frontal lobe and corona radiata with extension superiorly into the postcentral gyrus and inferiorly into the posterior insula. There is only faint associated T2 hyperintensity. Two small foci of restricted diffusion in the cerebellar  hemispheres measure 10 mm on the right and 3 mm on the left. There is a small focus of susceptibility  artifact associated with the diffusion abnormality in the right insula, compatible with a small amount of hemorrhage. Remote focus of hemorrhage in the left parietal lobe is unchanged. There may be a tiny focus of microhemorrhage in the anterior right temporal lobe.  Vasogenic edema in the left parietal lobe associated with the previously described metastasis has nearly completely resolved. Left cerebellar vasogenic edema has completely resolved. Patchy foci of T2 hyperintensity in the subcortical and periventricular white matter are similar to the prior study and compatible with mild chronic small vessel ischemic disease. There is mild generalized cerebral atrophy. There is no midline shift or extra-axial fluid collection.  Orbits are unremarkable. There is trace right maxillary sinus mucosal thickening. Mastoid air cells are clear. Major intracranial vascular flow voids are preserved.  MRA HEAD FINDINGS  Visualized distal vertebral arteries are patent with the left being dominant. There is mild narrowing of the distal right vertebral artery at the vertebrobasilar junction. Left PICA origin is patent. Right PICA is not identified. AICA origins and SCA origins are patent. The basilar artery is mildly irregular diffusely with mild narrowing in its midportion. There is a moderate to severe focal stenosis of the left PCA near its origin. The left posterior communicating artery appears irregular with apparent narrowing distally, however this may be due to motion artifact. There is mild bilateral MCA irregularity.  Internal carotid arteries are patent from skullbase to carotid termini. There is mild bilateral carotid siphon irregularity. There is apparent mild to moderate narrowing of the distal left supraclinoid ICA, although this is also in a region of motion artifact. 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 Left  A2 is supplied by the anterior communicating artery. There is mild bilateral proximal MCA irregularity without evidence of significant stenosis. No intracranial aneurysm is identified.  IMPRESSION: 1. New, mild diffusion abnormality in the right frontoparietal region as well as small foci of diffusion abnormality in both cerebellar hemispheres. These findings are compatible with acute to early subacute infarcts, however new/underlying metastases cannot be excluded given the lack of IV contrast. 2. Near complete interval resolution of vasogenic edema associated with the two previously described metastases. 3. Intracranial atherosclerotic change with mild narrowing of the distal right vertebral artery and mid basilar artery, mild to moderate narrowing of the supraclinoid left ICA, and apparent moderate to severe proximal left PCA stenosis. However, there is some motion artifact which may account for some of this appearance. If clinically desired, repeat MRA imaging could be performed.   Electronically Signed   By: Logan Bores   On: 03/08/2013 14:48    Medications: Scheduled Meds: . amLODipine  10 mg Oral Daily  . aspirin  325 mg Oral Daily  . folic acid  1 mg Oral Daily  . hydrALAZINE  25 mg Oral 3 times per day  . levETIRAcetam  1,000 mg Oral BID  . lisinopril  5 mg Oral Daily  . pantoprazole  40 mg Oral Daily  . piperacillin-tazobactam (ZOSYN)  IV  3.375 g Intravenous 3 times per day      LOS: 4 days   [facility] M.D. [facility] 03/11/2013, 3:32 PM Pager: [phone]  If 7PM-7AM, please contact night-coverage [email or web address] Password [email or web address]

## 2013-03-11 NOTE — Progress Notes (Signed)
CSW (Clinical Education officer, museum) continues to follow and await palliative discussion with family on plan.  [place], [place]

## 2013-03-12 DIAGNOSIS — R51 Headache: Secondary | ICD-10-CM

## 2013-03-12 LAB — CBC
HCT: 25 % — ABNORMAL LOW (ref 39.0–52.0)
Hemoglobin: 8.2 g/dL — ABNORMAL LOW (ref 13.0–17.0)
MCH: 29 pg (ref 26.0–34.0)
MCHC: 32.8 g/dL (ref 30.0–36.0)
MCV: 88.3 fL (ref 78.0–100.0)
Platelets: 120 10*3/uL — ABNORMAL LOW (ref 150–400)
RBC: 2.83 MIL/uL — AB (ref 4.22–5.81)
RDW: 16.5 % — ABNORMAL HIGH (ref 11.5–15.5)
WBC: 3.9 10*3/uL — AB (ref 4.0–10.5)

## 2013-03-12 LAB — BASIC METABOLIC PANEL
BUN: 18 mg/dL (ref 6–23)
CO2: 23 mEq/L (ref 19–32)
Calcium: 8.5 mg/dL (ref 8.4–10.5)
Chloride: 99 mEq/L (ref 96–112)
Creatinine, Ser: 1.45 mg/dL — ABNORMAL HIGH (ref 0.50–1.35)
GFR, EST AFRICAN AMERICAN: 56 mL/min — AB (ref >90)
GFR, EST NON AFRICAN AMERICAN: 48 mL/min — AB (ref >90)
Glucose, Bld: 95 mg/dL (ref 70–99)
POTASSIUM: 4.3 meq/L (ref 3.7–5.3)
SODIUM: 136 meq/L — AB (ref 137–147)

## 2013-03-12 NOTE — Progress Notes (Signed)
ANTIBIOTIC CONSULT NOTE  Pharmacy Consult for Zosyn Indication: aspiration PNA  No Known Allergies  Patient Measurements: Height: 6\' 2"  (188 cm) Weight: 153 lb 9.6 oz (69.673 kg) IBW/kg (Calculated) : 82.2  Vital Signs: Temp: 99.5 F (37.5 C) (03/12 0400) Temp src: Oral (03/12 0400) BP: 114/52 mmHg (03/12 0803) Pulse Rate: 83 (03/12 0803) Intake/Output from previous day: 03/11 0701 - 03/12 0700 In: 198 [P.O.:198] Out: 450 [Urine:450] Intake/Output from this shift: Total I/O In: 120 [P.O.:120] Out: -   Labs:  Recent Labs  03/12/13 0402  WBC 3.9*  HGB 8.2*  PLT 120*  CREATININE 1.45*   Estimated Creatinine Clearance: 48.7 ml/min (by C-G formula based on Cr of 1.45). No results found for this basename: Letta Median, VANCORANDOM, [place], GENTPEAK, [place], [place], TOBRAPEAK, TOBRARND, AMIKACINPEAK, AMIKACINTROU, AMIKACIN,  in the last 72 hours   Microbiology: Recent Results (from the past 720 hour(s))  TECHNOLOGIST REVIEW     Status: None   Collection Time    02/24/13  8:20 AM      Result Value Ref Range Status   Technologist Review Occ Metas and Myelocytes present, few helmets   Final  URINE CULTURE     Status: None   Collection Time    03/09/13  1:18 AM      Result Value Ref Range Status   Specimen Description URINE, RANDOM   Final   Special Requests NONE   Final   Culture  Setup Time     Final   Value: 03/09/2013 11:48     Performed at [place]     Final   Value: 30,000 COLONIES/ML     Performed at [facility]   Culture     Final   Value: Multiple bacterial morphotypes present, none predominant. Suggest appropriate recollection if clinically indicated.     Performed at [facility]   Report Status 03/11/2013 FINAL   Final  CULTURE, BLOOD (ROUTINE X 2)     Status: None   Collection Time    03/09/13  3:00 AM      Result Value Ref Range Status   Specimen Description BLOOD RIGHT HAND    Final   Special Requests BOTTLES DRAWN AEROBIC ONLY [facility]   Final   Culture  Setup Time     Final   Value: 03/09/2013 08:44     Performed at [facility]   Culture     Final   Value:        BLOOD CULTURE RECEIVED NO GROWTH TO DATE CULTURE WILL BE HELD FOR 5 DAYS BEFORE ISSUING A FINAL NEGATIVE REPORT     Performed at [facility]   Report Status PENDING   Incomplete  CULTURE, BLOOD (ROUTINE X 2)     Status: None   Collection Time    03/09/13  3:16 AM      Result Value Ref Range Status   Specimen Description BLOOD RIGHT HAND   Final   Special Requests BOTTLES DRAWN AEROBIC ONLY 5CC   Final   Culture  Setup Time     Final   Value: 03/09/2013 08:46     Performed at [facility]   Culture     Final   Value:        BLOOD CULTURE RECEIVED NO GROWTH TO DATE CULTURE WILL BE HELD FOR 5 DAYS BEFORE ISSUING A FINAL NEGATIVE REPORT     Performed at [facility]  Report Status PENDING   Incomplete   Assessment: 11 YOM with lung cancer and mets to brain undergoing palliative chemo being worked up for sudden weakness/possible stroke. Started on Zosyn for possible aspiration pneumonia on 3/9. No significant growth on cultures. Family deciding on palliative goals. WBC 3.9, afebrile. SCr 1.45, est CrCl ~45-52mL/min.  Goal of Therapy:  Eradication of infection  Plan:  1. Zosyn 3.375g IV q8h extended interval dosing 2. Follow palliative plans, LOT, clinical progression, and renal function 3. Follow up ability to transition to Augmentin  Ruthann Angulo D. Gianni Mihalik, PharmD, BCPS Clinical Pharmacist Pager: [phone] 03/12/2013 11:40 AM

## 2013-03-12 NOTE — Progress Notes (Signed)
SLP continuing to f/u. Await palliative discussion with family on plan.  [address], [place] [phone]

## 2013-03-12 NOTE — Consult Note (Signed)
I have reviewed this case with our NP and agree with the Assessment and Plan as stated.  Ryliee Figge L. Shaylie Eklund, MD MBA The Palliative Medicine Team at [place] Team Phone: [phone] Pager: [phone]   

## 2013-03-12 NOTE — Progress Notes (Signed)
Patient ID: Carlos Beasley  male  MVH:[id number]    DOB: 01-13-1945    DOA: 03/07/2013  PCP: [name], MD  Assessment/Plan: Principal Problem: Left sided hemiparesis - acute CVA  - f/u MRI reveals multifocal acute/subacute infarcts in right fronto parietal and b/l cerebellar areas  - PT eval recommending inpatient rehabilitation-goals not completely set. - carotid ultrasound negative for stenosis  - ECHO negative for thrombus  - left intracranial ICA with mild to mod narrowing  - LDL 71 but choles 231, continue aspirin 325 mg daily -Would de-escalate statin/ASA depending on final palliative discussions - Palliative care consulted, had family meeting 3/10 and plan to meet again 3/11-see below  Lung Cancer- [place] , stage IV with metastasis to brain - As per his oncologist: stage IV non-small cell lung cancer, adenocarcinoma with brain metastasis status post stereotactic radiotherapy to brain lesions and he was currently undergoing systemic chemotherapy with carboplatin and Alimta status post 2 cycles. Dr. Julien Nordmann agreed with palliative care and hospice referral and did not think that the patient would be a candidate for any further chemotherapy unless he made significant improvement. - previous brain mets show decreased vasogenic edema   Positive Troponin  - suspected to be troponin leak  - no further work up per Dr Einar Gip as pt is not a candidate for strong anticoagulants due to his cancerous masses   Aspiration pneumonia, UTI - Urine culture suggests contamination and blood cultures x2: Negative to date - Chest x-ray shows patchy infiltrate right base, likely aspiration pneumonia, started on Zosyn 3/9. -potentially de-escalate to Augmentin dpeneding on fianl goals of care   ARF  - Continue gentle hydration, creatinine improving  Anemia and thrombocytopenia - Likely secondary to chronic disease and chemotherapy. Follow CBCs  Dysphagia - Continue D1 diet.  Chronic systolic  and diastolic CHF  - compensated   Cocaine abuse  Tobacco abuse   Seizure disorder  - cont Keppra   DVT Prophylaxis:  Code Status: Full  Family Communication: None at bedside.   Disposition:  -discussed with palliative care and family is having a hard time making a decision, and needs more time to thijnk through his multiple issues-note multiple family members coming into town in the next day to realistically and holsitically discuss their thoughts on trajectory of his care  Consultants:  Neurology  Cardiology  Palliative medicine   Procedures:  None  Antibiotics:  IV Zosyn 3/9>>   Subjective: Headache, states some SOb No n/v/cp Not eating much Seems to somewhat understand what is going on-made aware to him that he might actively be dying  Objective: Weight change:   Intake/Output Summary (Last 24 hours) at 03/12/13 1048 Last data filed at 03/12/13 0837  Gross per 24 hour  Intake    205 ml  Output    450 ml  Net   -245 ml   Blood pressure 114/52, pulse 83, temperature 99.5 F (37.5 C), temperature source Oral, resp. rate 18, height 6\' 2"  (1.88 m), weight 69.673 kg (153 lb 9.6 oz), SpO2 100.00%.  Physical Exam: General: Alert and awake, oriented x3, not in any acute distress.  CVS: S1-S2 clear, no murmur rubs or gallops Chest: Reduced breath sounds in the bases, right >  left with occasional basal crackles. Rest clear. No increased work of breathing. Abdomen: soft nontender, nondistended, normal bowel sounds  Extremities: no cyanosis, clubbing or edema noted bilaterally Neuro: 5 x 5 strength on right side, left ue 2/5, LLE 3-4/5  Lab Results: Basic Metabolic Panel:  Recent Labs Lab 03/09/13 0300 03/12/13 0402  NA 139 136*  K 4.3 4.3  CL 100 99  CO2 24 23  GLUCOSE 77 95  BUN 23 18  CREATININE 1.39* 1.45*  CALCIUM 8.4 8.5   Liver Function Tests:  Recent Labs Lab 03/07/13 2136  AST 25  ALT 59*  ALKPHOS 49  BILITOT <0.2*  PROT 6.6    ALBUMIN 3.2*   No results found for this basename: LIPASE, AMYLASE,  in the last 168 hours No results found for this basename: AMMONIA,  in the last 168 hours CBC:  Recent Labs Lab 03/07/13 2136 03/07/13 2141 03/12/13 0402  WBC 4.0  --  3.9*  NEUTROABS 2.6  --   --   HGB 8.1* 8.8* 8.2*  HCT 24.5* 26.0* 25.0*  MCV 89.7  --  88.3  PLT 50*  --  120*   Cardiac Enzymes:  Recent Labs Lab 03/08/13 0530 03/08/13 1451 03/08/13 2155  TROPONINI 0.44* 0.41* 0.41*   BNP: No components found with this basename: POCBNP,  CBG: No results found for this basename: GLUCAP,  in the last 168 hours   Micro Results: Recent Results (from the past 240 hour(s))  URINE CULTURE     Status: None   Collection Time    03/09/13  1:18 AM      Result Value Ref Range Status   Specimen Description URINE, RANDOM   Final   Special Requests NONE   Final   Culture  Setup Time     Final   Value: 03/09/2013 11:48     Performed at [facility]     Final   Value: 30,000 COLONIES/ML     Performed at [facility]   Culture     Final   Value: Multiple bacterial morphotypes present, none predominant. Suggest appropriate recollection if clinically indicated.     Performed at [facility]   Report Status 03/11/2013 FINAL   Final  CULTURE, BLOOD (ROUTINE X 2)     Status: None   Collection Time    03/09/13  3:00 AM      Result Value Ref Range Status   Specimen Description BLOOD RIGHT HAND   Final   Special Requests BOTTLES DRAWN AEROBIC ONLY [facility]   Final   Culture  Setup Time     Final   Value: 03/09/2013 08:44     Performed at [facility]   Culture     Final   Value:        BLOOD CULTURE RECEIVED NO GROWTH TO DATE CULTURE WILL BE HELD FOR 5 DAYS BEFORE ISSUING A FINAL NEGATIVE REPORT     Performed at [facility]   Report Status PENDING   Incomplete  CULTURE, BLOOD (ROUTINE X 2)     Status: None   Collection Time    03/09/13  3:16 AM       Result Value Ref Range Status   Specimen Description BLOOD RIGHT HAND   Final   Special Requests BOTTLES DRAWN AEROBIC ONLY 5CC   Final   Culture  Setup Time     Final   Value: 03/09/2013 08:46     Performed at [facility]   Culture     Final   Value:        BLOOD CULTURE RECEIVED NO GROWTH TO DATE CULTURE WILL BE HELD FOR 5 DAYS BEFORE ISSUING A FINAL NEGATIVE  REPORT     Performed at [facility]   Report Status PENDING   Incomplete    Studies/Results: Dg Chest 2 View  03/08/2013   CLINICAL DATA:  Cerebral infarction and left upper extremity weakness. History of lung carcinoma.  EXAM: CHEST - 2 VIEW  COMPARISON:  DG CHEST 2 VIEW dated 02/21/2013; MR MRA HEAD W/O CM dated 03/08/2013; CT HEAD W/O CM dated 03/07/2013; NM PET IMAGE INITIAL (PI) SKULL BASE TO THIGH dated 01/26/2013; US BIOPSY dated 01/06/2013; CT CHEST W/CM dated 01/04/2013  FINDINGS: Stable cardiomegaly and mild chronic lung disease without overt edema, airspace consolidation or pleural effusion. No masses are identified. The bony thorax is unremarkable.  IMPRESSION: Stable chronic lung disease and cardiomegaly.   Electronically Signed   By: Aletta Edouard M.D.   On: 03/08/2013 13:41   Dg Chest 2 View  02/21/2013   CLINICAL DATA:  Hypertension, history of metastatic lung cancer  EXAM: CHEST  2 VIEW  COMPARISON:  07/11/2012  FINDINGS: Stable heart size and vascularity. Lungs remain clear. 7 mm right upper low spiculated density, faintly visualized by chest x-ray overlying the first anterior rib shadow. This is stable in size. No new superimposed pneumonia, collapse or consolidation. No edema, effusion or pneumothorax. Trachea midline.  IMPRESSION: Stable chest exam.  No superimposed acute process   Electronically Signed   By: Daryll Brod M.D.   On: 02/21/2013 12:20   Ct Head Wo Contrast  03/07/2013   CLINICAL DATA:  Code stroke, left face and extremity weakness. History of metastatic disease to the brain treated with  radiation.  EXAM: CT HEAD WITHOUT CONTRAST  TECHNIQUE: Contiguous axial images were obtained from the base of the skull through the vertex without contrast.  COMPARISON:  NM PET IMAGE INITIAL (PI) SKULL BASE TO THIGH dated 01/26/2013; MR HEAD WO/W CM dated 01/13/2013; MR HEAD WO/W CM dated 01/03/2013; CT HEAD W/O CM dated 01/02/2013  FINDINGS: Previous areas of vasogenic edema related to intracranial metastatic disease have largely resolved. Slight residual hypodensity in the left parasagittal posterior frontal cortex representing the involuting larger metastasis. No acute stroke, acute hemorrhage, significant mass lesion, hydrocephalus, or extra-axial fluid. Generalized atrophy. Small vessel disease. No fracture or osseous destructive lesion. Clear sinuses. No mastoid fluid.  IMPRESSION: Atrophy and small vessel disease.  No acute intracranial findings.  Critical Value/emergent results were called by telephone at the time of interpretation on 03/07/2013 at 9:52 PM to Stroke neurologist who verbally acknowledged these results.   Electronically Signed   By: Rolla Flatten M.D.   On: 03/07/2013 20:56   Mr Brain Wo Contrast  03/08/2013   CLINICAL DATA:  Acute onset left-sided weakness. History of metastatic non-small cell lung cancer with recent brain radiation therapy.  EXAM: MRI HEAD WITHOUT CONTRAST  MRA HEAD WITHOUT CONTRAST  TECHNIQUE: Multiplanar, multiecho pulse sequences of the brain and surrounding structures were obtained without intravenous contrast. Angiographic images of the head were obtained using MRA technique without contrast.  COMPARISON:  CT HEAD W/O CM dated 03/07/2013; MR HEAD WO/W CM dated 01/13/2013  FINDINGS: MRI HEAD FINDINGS  There is a small region of mildly restricted diffusion predominantly involving the posterior right frontal lobe and corona radiata with extension superiorly into the postcentral gyrus and inferiorly into the posterior insula. There is only faint associated T2 hyperintensity. Two  small foci of restricted diffusion in the cerebellar hemispheres measure 10 mm on the right and 3 mm on the left. There is a small focus  of susceptibility artifact associated with the diffusion abnormality in the right insula, compatible with a small amount of hemorrhage. Remote focus of hemorrhage in the left parietal lobe is unchanged. There may be a tiny focus of microhemorrhage in the anterior right temporal lobe.  Vasogenic edema in the left parietal lobe associated with the previously described metastasis has nearly completely resolved. Left cerebellar vasogenic edema has completely resolved. Patchy foci of T2 hyperintensity in the subcortical and periventricular white matter are similar to the prior study and compatible with mild chronic small vessel ischemic disease. There is mild generalized cerebral atrophy. There is no midline shift or extra-axial fluid collection.  Orbits are unremarkable. There is trace right maxillary sinus mucosal thickening. Mastoid air cells are clear. Major intracranial vascular flow voids are preserved.  MRA HEAD FINDINGS  Visualized distal vertebral arteries are patent with the left being dominant. There is mild narrowing of the distal right vertebral artery at the vertebrobasilar junction. Left PICA origin is patent. Right PICA is not identified. AICA origins and SCA origins are patent. The basilar artery is mildly irregular diffusely with mild narrowing in its midportion. There is a moderate to severe focal stenosis of the left PCA near its origin. The left posterior communicating artery appears irregular with apparent narrowing distally, however this may be due to motion artifact. There is mild bilateral MCA irregularity.  Internal carotid arteries are patent from skullbase to carotid termini. There is mild bilateral carotid siphon irregularity. There is apparent mild to moderate narrowing of the distal left supraclinoid ICA, although this is also in a region of motion  artifact. 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 Left A2 is supplied by the anterior communicating artery. There is mild bilateral proximal MCA irregularity without evidence of significant stenosis. No intracranial aneurysm is identified.  IMPRESSION: 1. New, mild diffusion abnormality in the right frontoparietal region as well as small foci of diffusion abnormality in both cerebellar hemispheres. These findings are compatible with acute to early subacute infarcts, however new/underlying metastases cannot be excluded given the lack of IV contrast. 2. Near complete interval resolution of vasogenic edema associated with the two previously described metastases. 3. Intracranial atherosclerotic change with mild narrowing of the distal right vertebral artery and mid basilar artery, mild to moderate narrowing of the supraclinoid left ICA, and apparent moderate to severe proximal left PCA stenosis. However, there is some motion artifact which may account for some of this appearance. If clinically desired, repeat MRA imaging could be performed.   Electronically Signed   By: Logan Bores   On: 03/08/2013 14:48   Dg Chest Port 1 View  03/09/2013   CLINICAL DATA:  fever  EXAM: PORTABLE CHEST - 1 VIEW  COMPARISON:  March 08, 2013  FINDINGS: There is patchy interstitial infiltrate in the right base. Lungs are otherwise clear. Heart is enlarged with normal pulmonary vascularity. Aorta is prominent but stable. No adenopathy.  IMPRESSION: Patchy interstitial infiltrate right base.  Stable cardiomegaly.   Electronically Signed   By: Lowella Grip M.D.   On: 03/09/2013 08:09   Mr Jodene Nam Head/brain Wo Cm  03/08/2013   CLINICAL DATA:  Acute onset left-sided weakness. History of metastatic non-small cell lung cancer with recent brain radiation therapy.  EXAM: MRI HEAD WITHOUT CONTRAST  MRA HEAD WITHOUT CONTRAST  TECHNIQUE: Multiplanar, multiecho pulse sequences of the brain and surrounding structures were obtained without  intravenous contrast. Angiographic images of the head were obtained using MRA technique without contrast.  COMPARISON:  CT HEAD W/O CM dated 03/07/2013; MR HEAD WO/W CM dated 01/13/2013  FINDINGS: MRI HEAD FINDINGS  There is a small region of mildly restricted diffusion predominantly involving the posterior right frontal lobe and corona radiata with extension superiorly into the postcentral gyrus and inferiorly into the posterior insula. There is only faint associated T2 hyperintensity. Two small foci of restricted diffusion in the cerebellar hemispheres measure 10 mm on the right and 3 mm on the left. There is a small focus of susceptibility artifact associated with the diffusion abnormality in the right insula, compatible with a small amount of hemorrhage. Remote focus of hemorrhage in the left parietal lobe is unchanged. There may be a tiny focus of microhemorrhage in the anterior right temporal lobe.  Vasogenic edema in the left parietal lobe associated with the previously described metastasis has nearly completely resolved. Left cerebellar vasogenic edema has completely resolved. Patchy foci of T2 hyperintensity in the subcortical and periventricular white matter are similar to the prior study and compatible with mild chronic small vessel ischemic disease. There is mild generalized cerebral atrophy. There is no midline shift or extra-axial fluid collection.  Orbits are unremarkable. There is trace right maxillary sinus mucosal thickening. Mastoid air cells are clear. Major intracranial vascular flow voids are preserved.  MRA HEAD FINDINGS  Visualized distal vertebral arteries are patent with the left being dominant. There is mild narrowing of the distal right vertebral artery at the vertebrobasilar junction. Left PICA origin is patent. Right PICA is not identified. AICA origins and SCA origins are patent. The basilar artery is mildly irregular diffusely with mild narrowing in its midportion. There is a moderate to  severe focal stenosis of the left PCA near its origin. The left posterior communicating artery appears irregular with apparent narrowing distally, however this may be due to motion artifact. There is mild bilateral MCA irregularity.  Internal carotid arteries are patent from skullbase to carotid termini. There is mild bilateral carotid siphon irregularity. There is apparent mild to moderate narrowing of the distal left supraclinoid ICA, although this is also in a region of motion artifact. 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 Left A2 is supplied by the anterior communicating artery. There is mild bilateral proximal MCA irregularity without evidence of significant stenosis. No intracranial aneurysm is identified.  IMPRESSION: 1. New, mild diffusion abnormality in the right frontoparietal region as well as small foci of diffusion abnormality in both cerebellar hemispheres. These findings are compatible with acute to early subacute infarcts, however new/underlying metastases cannot be excluded given the lack of IV contrast. 2. Near complete interval resolution of vasogenic edema associated with the two previously described metastases. 3. Intracranial atherosclerotic change with mild narrowing of the distal right vertebral artery and mid basilar artery, mild to moderate narrowing of the supraclinoid left ICA, and apparent moderate to severe proximal left PCA stenosis. However, there is some motion artifact which may account for some of this appearance. If clinically desired, repeat MRA imaging could be performed.   Electronically Signed   By: Logan Bores   On: 03/08/2013 14:48    Medications: Scheduled Meds: . amLODipine  10 mg Oral Daily  . aspirin  325 mg Oral Daily  . folic acid  1 mg Oral Daily  . hydrALAZINE  25 mg Oral 3 times per day  . levETIRAcetam  1,000 mg Oral BID  . lisinopril  5 mg Oral Daily  . pantoprazole  40 mg Oral Daily  . piperacillin-tazobactam (ZOSYN)  IV  3.375 g Intravenous 3  times per day      LOS: 5 days   Nita Sells M.D. [facility] 03/12/2013, 10:48 AM Pager: [phone]  If 7PM-7AM, please contact night-coverage [email or web address] Password [email or web address]

## 2013-03-12 NOTE — Progress Notes (Signed)
Progress Note from the Palliative Medicine Team at [facility]:  patient is lethargic and appears generally uncomfortable  -spoke to son [name] via telephone.  -I updated him on current condition and decision for DNR/DNI was made  Objective: No Known Allergies Scheduled Meds: . amLODipine  10 mg Oral Daily  . aspirin  325 mg Oral Daily  . folic acid  1 mg Oral Daily  . hydrALAZINE  25 mg Oral 3 times per day  . levETIRAcetam  1,000 mg Oral BID  . lisinopril  5 mg Oral Daily  . pantoprazole  40 mg Oral Daily  . piperacillin-tazobactam (ZOSYN)  IV  3.375 g Intravenous 3 times per day   Continuous Infusions: . sodium chloride 50 mL/hr at 03/11/13 2022   PRN Meds:.acetaminophen, morphine CONCENTRATE, prochlorperazine, RESOURCE THICKENUP CLEAR, senna-docusate  BP 114/52  Pulse 83  Temp(Src) 99.5 F (37.5 C) (Oral)  Resp 18  Ht 6\' 2"  (1.88 m)  Wt 69.673 kg (153 lb 9.6 oz)  BMI 19.71 kg/m2  SpO2 100%   PPS:30 % at best  Pain Score:denies    Intake/Output Summary (Last 24 hours) at 03/12/13 0843 Last data filed at 03/12/13 0837  Gross per 24 hour  Intake    205 ml  Output    450 ml  Net   -245 ml       Physical Exam:  General: alert, follows simple commands, NAD  HEENT: Mm, no exudate  Chest: CTA  CVS: RRR  Abdomen:soft NT +BS  Ext: without edema Skin: warm and dry  Neuro:left sided weakness, garbled speech  Labs: CBC    Component Value Date/Time   WBC 3.9* 03/12/2013 0402   WBC 2.0* 03/03/2013 0806   RBC 2.83* 03/12/2013 0402   RBC 3.19* 03/03/2013 0806   HGB 8.2* 03/12/2013 0402   HGB 9.5* 03/03/2013 0806   HCT 25.0* 03/12/2013 0402   HCT 29.2* 03/03/2013 0806   PLT 120* 03/12/2013 0402   PLT 75* 03/03/2013 0806   MCV 88.3 03/12/2013 0402   MCV 91.6 03/03/2013 0806   MCH 29.0 03/12/2013 0402   MCH 29.9 03/03/2013 0806   MCHC 32.8 03/12/2013 0402   MCHC 32.7 03/03/2013 0806   RDW 16.5* 03/12/2013 0402   RDW 16.8* 03/03/2013 0806   LYMPHSABS 0.8 03/07/2013 2136    LYMPHSABS 0.7* 03/03/2013 0806   MONOABS 0.6 03/07/2013 2136   MONOABS 0.2 03/03/2013 0806   EOSABS 0.0 03/07/2013 2136   EOSABS 0.0 03/03/2013 0806   BASOSABS 0.0 03/07/2013 2136   BASOSABS 0.0 03/03/2013 0806    BMET    Component Value Date/Time   NA 136* 03/12/2013 0402   NA 145 03/03/2013 0806   K 4.3 03/12/2013 0402   K 4.3 03/03/2013 0806   CL 99 03/12/2013 0402   CO2 23 03/12/2013 0402   CO2 29 03/03/2013 0806   GLUCOSE 95 03/12/2013 0402   GLUCOSE 137 03/03/2013 0806   BUN 18 03/12/2013 0402   BUN 24.7 03/03/2013 0806   CREATININE 1.45* 03/12/2013 0402   CREATININE 1.2 03/03/2013 0806   CALCIUM 8.5 03/12/2013 0402   CALCIUM 9.2 03/03/2013 0806   GFRNONAA 48* 03/12/2013 0402   GFRAA 56* 03/12/2013 0402    CMP     Component Value Date/Time   NA 136* 03/12/2013 0402   NA 145 03/03/2013 0806   K 4.3 03/12/2013 0402   K 4.3 03/03/2013 0806   CL 99 03/12/2013 0402   CO2 23 03/12/2013 0402  CO2 29 03/03/2013 0806   GLUCOSE 95 03/12/2013 0402   GLUCOSE 137 03/03/2013 0806   BUN 18 03/12/2013 0402   BUN 24.7 03/03/2013 0806   CREATININE 1.45* 03/12/2013 0402   CREATININE 1.2 03/03/2013 0806   CALCIUM 8.5 03/12/2013 0402   CALCIUM 9.2 03/03/2013 0806   PROT 6.6 03/07/2013 2136   PROT 6.8 03/03/2013 0806   ALBUMIN 3.2* 03/07/2013 2136   ALBUMIN 3.2* 03/03/2013 0806   AST 25 03/07/2013 2136   AST 30 03/03/2013 0806   ALT 59* 03/07/2013 2136   ALT 90* 03/03/2013 0806   ALKPHOS 49 03/07/2013 2136   ALKPHOS 68 03/03/2013 0806   BILITOT <0.2* 03/07/2013 2136   BILITOT 0.28 03/03/2013 0806   GFRNONAA 48* 03/12/2013 0402   GFRAA 56* 03/12/2013 0402   1  Assessment and Plan: 1. Code Status:  DNR/DNI  2. Symptom Control: 1. Weakness: PT/OT as tolerated  2. Pain: Roxanol 5 mg PO every 4 hrs pr  3. Psycho/Social: Emotional support offered to son, family continues to struggle with decision regarding plan of care; a shift to full comfort considering a in patient hospice or SNF for rehablitation  4. Disposition:  Pending family  decision  Patient Documents Completed or Given: Document Given Completed  Advanced Directives Pkt    MOST yes   DNR    Gone from [facility]    Hard Choices yes      Wadie Lessen NP  Palliative Medicine Team Team Phone # [phone] Pager [phone]  Discussed with Dr Verlon Au and nursing staff 1

## 2013-03-13 NOTE — Progress Notes (Signed)
   This NP Carlos Beasley placed call to patient's son Carlos Beasley and left message encouraging him to contact the attending when he and his family gathered this week-end to discuss overall advanced directives, discharge plan and options.  I will be off this week-end but the  Palliative Medicine Team can be reached at # [phone].  Carlos Lessen NP  Palliative Medicine Team Team Phone # [phone] Pager [phone]

## 2013-03-13 NOTE — Progress Notes (Signed)
CSW (Clinical Education officer, museum) continues to follow and will assist with dc if needed.   [place], [place]

## 2013-03-13 NOTE — Progress Notes (Addendum)
[facility] PROGRESS NOTE  HY SWIATEK HCW:[id number] DOB: 1945-06-14 DOA: 03/07/2013 PCP: [name], MD  Brief narrative: 68 y.o. 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 In the ED, code stroke was called. CT head revealed no acute intracranial findings and improvement in the vasogenic edema surrounding the metastasis of the left parasagital. posterior cortex..  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 - awaiting palliative care for goals of care - continue Keppra for seizure prophylaxis Positive Troponin  - suspected to be troponin leak  - no further work up per Dr Einar Gip as pt is not a candidate for strong anticoagulants due to his cancerous masses  Aspiration pneumonia, UTI  - Urine culture suggests contamination and blood cultures x2 negative to date  - Chest x-ray shows patchy infiltrate right base, likely aspiration pneumonia, started on Zosyn Hypertension - continue hydralazine and norvasc but stop lisinopril as it may worsen renal function  ARF  - probably dehydration or lisinopril - stop lisinopril - creatinine improving with IV fluids, creatinine trended down 1.63 --> 1.45 Anemia and  thrombocytopenia  - secondary to history of malignancy and sequela of chemotherapy - hemoglobin 8.2 this am and platelets 120 - no indications for transfusion Dysphagia  - Continue dysphagia 1 diet  Chronic systolic and diastolic CHF  - compensated  H/O Cocaine abuse and Tobacco abuse  Seizure disorder  - cont Keppra 1000 mg PO BID  Consultants:  Neurology  Cardiology  Palliative medicine  Procedures:  None Antibiotics:  IV Zosyn 3/9>   Code Status: DNR/DNI Family Communication: family not at the bedside  Disposition Plan: remains inpatient   Leisa Lenz, MD  [facility] Pager [phone]  If 7PM-7AM, please contact night-coverage [email or web address] Password [facility] 03/13/2013, 3:46 PM   LOS: 6 days   HPI/Subjective: No acute overnight events.   Objective: Filed Vitals:   03/13/13 0000 03/13/13 0400 03/13/13 0750 03/13/13 1229  BP: 136/65 127/53 125/60 115/49  Pulse: 90 100 100 81  Temp: 98.9 F (37.2 C) 99.6 F (37.6 C) 99.4 F (37.4 C) 98.7 F (37.1 C)  TempSrc: Oral Oral Oral Oral  Resp:  17 18 17   Height:      Weight:      SpO2: 95% 100% 100% 97%    Intake/Output Summary (Last 24 hours) at 03/13/13 1546 Last data filed at 03/13/13 1300  Gross per 24 hour  Intake    180 ml  Output   1000 ml  Net   -820 ml    Exam:   General:  Pt is sleeping this am, no acute distress  Cardiovascular: Regular rate and rhythm, S1/S2 appreciated   Respiratory: diminished breath sounds bilaterally, no wheezing   Abdomen: Soft, non  tender, non distended, bowel sounds present, no guarding  Extremities: No edema, pulses DP and PT palpable bilaterally  Neuro: Left side weakness, no other focal neurologic deficits  Data Reviewed: Basic Metabolic Panel:  Recent Labs Lab 03/07/13 2136 03/07/13 2141 03/09/13 0300 03/12/13 0402  NA 140 140 139 136*  K 4.3 4.2 4.3 4.3  CL 101 104 100 99  CO2 26  --  24 23  GLUCOSE 101* 100* 77 95  BUN 34* 35* 23 18   CREATININE 1.63* 1.90* 1.39* 1.45*  CALCIUM 9.0  --  8.4 8.5   Liver Function Tests:  Recent Labs Lab 03/07/13 2136  AST 25  ALT 59*  ALKPHOS 49  BILITOT <0.2*  PROT 6.6  ALBUMIN 3.2*   No results found for this basename: LIPASE, AMYLASE,  in the last 168 hours No results found for this basename: AMMONIA,  in the last 168 hours CBC:  Recent Labs Lab 03/07/13 2136 03/07/13 2141 03/12/13 0402  WBC 4.0  --  3.9*  NEUTROABS 2.6  --   --   HGB 8.1* 8.8* 8.2*  HCT 24.5* 26.0* 25.0*  MCV 89.7  --  88.3  PLT 50*  --  120*   Cardiac Enzymes:  Recent Labs Lab 03/08/13 0530 03/08/13 1451 03/08/13 2155  TROPONINI 0.44* 0.41* 0.41*   BNP: No components found with this basename: POCBNP,  CBG: No results found for this basename: GLUCAP,  in the last 168 hours  URINE CULTURE     Status: None   Collection Time    03/09/13  1:18 AM      Result Value Ref Range Status   Specimen Description URINE, RANDOM   Final   Value: Multiple bacterial morphotypes present, none predominant. Suggest appropriate recollection if clinically indicated.     Performed at [facility]   Report Status 03/11/2013 FINAL   Final  CULTURE, BLOOD (ROUTINE X 2)     Status: None   Collection Time    03/09/13  3:00 AM      Result Value Ref Range Status   Specimen Description BLOOD RIGHT HAND   Final   Value:        BLOOD CULTURE RECEIVED NO GROWTH TO DATE      Performed at [facility]   Report Status PENDING   Incomplete  CULTURE, BLOOD (ROUTINE X 2)     Status: None   Collection Time    03/09/13  3:16 AM      Result Value Ref Range Status   Specimen Description BLOOD RIGHT HAND   Final   Value:        BLOOD CULTURE RECEIVED NO GROWTH TO DATE      Performed at [facility]   Report Status PENDING   Incomplete     Studies: No results found.  Scheduled Meds: . amLODipine  10 mg Oral Daily  . aspirin  325 mg Oral Daily  . folic acid  1 mg Oral Daily  . hydrALAZINE   25 mg Oral 3 times per day  . levETIRAcetam  1,000 mg Oral BID  . lisinopril  5 mg Oral Daily  . pantoprazole  40 mg Oral Daily  . piperacillin-tazobactam  3.375 g Intravenous 3 times per day   Continuous Infusions: . sodium chloride 50 mL/hr at 03/13/13 1433

## 2013-03-13 NOTE — Progress Notes (Signed)
Physical Therapy Treatment Patient Details Name: Carlos Beasley MRN: [id number] DOB: [birth year] Today's Date: 03/13/2013 Time: 0950-1001 PT Time Calculation (min): 11 min  PT Assessment / Plan / Recommendation  History of Present Illness 68 y.o. male admitted to [facility] on 03/07/13 with with Metastatic Lung [name] diagnosed in 01/2013  on Palliative Chemo Rx and recent Radiation Rx to the Brain who presents to the ED with complaints of sudden onset of Left sided weakness. 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  Oncology now recommending Hospice referral   PT Comments   Pt admitted with above. Pt currently with functional limitations due to balance and endurance deficits.  Pt will benefit from skilled PT to increase their independence and safety with mobility to allow discharge to the venue listed below.   Follow Up Recommendations  SNF;Supervision/Assistance - 24 hour (if hospice involved, will not be a PT candidate at SNF)     Does the patient have the potential to tolerate intense rehabilitation     Barriers to Discharge        Equipment Recommendations  Wheelchair (measurements PT);Wheelchair cushion (measurements PT);Hospital bed    Recommendations for Other Services    Frequency Min 2X/week   Progress towards PT Goals Progress towards PT goals: Progressing toward goals  Plan Current plan remains appropriate    Precautions / Restrictions Precautions Precautions: Fall Precaution Comments: left hemiperesis; pushes to his Lt Restrictions Weight Bearing Restrictions: No   Pertinent Vitals/Pain VSS,no pain    Mobility  Bed Mobility Overal bed mobility: Needs Assistance;+2 for physical assistance Bed Mobility: Supine to Sit Supine to sit: Mod assist;+2 for physical assistance;HOB elevated General bed mobility comments: HOB semi elevated and pivoted to his left side of bed; HOB remained elevated .Needed cues and  assist for transitional movements to EOB and for scooting.  Pt impulsive and overshoots making safety poor and need for mod assist.   Transfers Overall transfer level: Needs assistance Equipment used: 2 person hand held assist Transfers: Sit to/from [employer] Sit to Stand: Max assist;+2 physical assistance;From elevated surface Stand pivot transfers: Max assist;+2 physical assistance;From elevated surface General transfer comment: continues to push to his Left; stood x 8 sec with trunk flexion and lateral flexion to his Lt; attempted squat pivot to his left.  However once he initiated coming up into squat, he immediately fully extended his RLE, stepped it laterally into an abducted position and began to push to his Lt and  needing max assist to control descent into chair.  Poor safety awareness and lacks insight into safe transitional movements.   Modified Rankin (Stroke Patients Only) Pre-Morbid Rankin Score: No significant disability Modified Rankin: Severe disability    Exercises General Exercises - Lower Extremity Ankle Circles/Pumps: AAROM;Both;10 reps;Supine Heel Slides: AAROM;Both;10 reps;Supine   PT Diagnosis:    PT Problem List:   PT Treatment Interventions:     PT Goals (current goals can now be found in the care plan section)    Visit Information  Last PT Received On: 03/13/13 Assistance Needed: +2 History of Present Illness: 68 y.o. male admitted to [facility] on 03/07/13 with with Metastatic Lung [name] diagnosed in 01/2013  on Palliative Chemo Rx and recent Radiation Rx to the Brain who presents to the ED with complaints of sudden onset of Left sided weakness. 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  Oncology now recommending Hospice referral  Subjective Data  Subjective: Reported back hurting and wants OOB to chair   Cognition  Cognition Arousal/Alertness: Lethargic Behavior During Therapy:  Flat affect Overall Cognitive Status: Impaired/Different from baseline Area of Impairment: Attention;Following commands;Safety/judgement;Awareness;Problem solving Current Attention Level: Sustained Following Commands: Follows one step commands inconsistently Safety/Judgement: Decreased awareness of safety;Decreased awareness of deficits Awareness:  (not yet intellectual, but did acknowledge LUE ) Problem Solving: Slow processing;Requires verbal cues;Requires tactile cues General Comments: able to attend to LUE by holding Lt forearm with Rt hand for up to 2 minutes    Balance  Balance Overall balance assessment: Needs assistance Sitting-balance support: No upper extremity supported;Feet supported Sitting balance-Leahy Scale: Zero Sitting balance - Comments: pusher. L bias;Have to move right UE so pt will not push.  With Rt hand holding Lt arm, he could then use his torso to right his trunk to midline and maintain up to 10 seconds (then begins leaning to his Lt). Total EOB time 10 minutes  Postural control: Left lateral lean;Posterior lean Standing balance support: Single extremity supported;During functional activity Standing balance-Leahy Scale: Zero Standing balance comment: Poor standing balance due to right LE pushes and extends with weight bearing with pt losing balance.    End of Session PT - End of Session Equipment Utilized During Treatment: Gait belt;Oxygen Activity Tolerance: Patient limited by fatigue Patient left: in chair;with call bell/phone within reach;with chair alarm set;Other (comment) Nurse Communication: Mobility status;Need for lift equipment   GP     INGOLD,Carlos Benninger 03/13/2013, 11:24 AM Carlos Beasley [facility] [phone] (pager)

## 2013-03-13 NOTE — Progress Notes (Signed)
Upon entering room, chaplain observed pt sitting in chair beside his bed.  Also noted that pt was fidgety and spoke of his suffering,using short sentences/phrases.  For the most part, pt was non-communicative except on the topic of sports, especially golf.     03/13/13 1000  Clinical Encounter Type  Visited With Patient  Visit Type Spiritual support  Spiritual Encounters  Spiritual Needs Emotional    Estelle June, chaplain pager [phone]

## 2013-03-14 MED ORDER — IPRATROPIUM-ALBUTEROL 0.5-2.5 (3) MG/3ML IN SOLN
3.0000 mL | Freq: Two times a day (BID) | RESPIRATORY_TRACT | Status: DC
  Administered 2013-03-15: 3 mL via RESPIRATORY_TRACT
  Filled 2013-03-14: qty 3

## 2013-03-14 MED ORDER — IPRATROPIUM-ALBUTEROL 0.5-2.5 (3) MG/3ML IN SOLN
3.0000 mL | RESPIRATORY_TRACT | Status: DC
  Administered 2013-03-14 (×4): 3 mL via RESPIRATORY_TRACT
  Filled 2013-03-14 (×4): qty 3

## 2013-03-14 NOTE — Progress Notes (Addendum)
CSW (Clinical Education officer, museum) received consult for SNF. CSW has already been working with family and initiated this process and was awaiting family meeting with MD and/or Palliative team to make decision between SNF or Residential Hospice. CSW unsure if this meeting is taking place and was informed by nurse that one of pt sons was present in room. CSW visited room and discussed options with pt son. CSW did inform family that if they need more time to decide a good option could be SNF with palliative following. CSW explained that this would give family more time to see how pt is progressing as this seems to be family's biggest concern. Pt son stated that pt has started to talk to them more and they are seeing improvements and they do not want to place him in residential hospice if this is the case. CSW provided pt son with bed offers and asked for family to please make decision today. Pt son was agreeable to this and informed CSW his brother will be here within the hour and then they will go out and tour facilities.    [place], [place]

## 2013-03-14 NOTE — Progress Notes (Signed)
[facility] PROGRESS NOTE  Carlos Beasley XTG:[id number] DOB: Jan 25, 1945 DOA: 03/07/2013 PCP: [name], MD  Brief narrative: 68 y.o. 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 In the ED, code stroke was called. CT head revealed no acute intracranial findings and improvement in the vasogenic edema surrounding the metastasis of the left parasagital. posterior cortex.  Assessment/Plan:   Principal Problem:  Left sided hemiparesis in the setting of acute CVA  - MRI revealed multifocal acute/subacute infarcts in right fronto parietal and b/l cerebellar areas  - neurology has seen and evaluated the patient; at this time they signed off with recommendations of continuing Keppra 1000 mg PO BID and palliative care consultation for goals of care  - PT eval recommending SNF; will ask SW to assist with discharge plan - Carotid ultrasound negative for stenosis  - ECHO negative for thrombus  - continue aspirin 325 mg daily  - follow up on PCT for goals of care; if it does not happen in hospital they may follow up in SNF Active Problems:  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  - continue Keppra for seizure prophylaxis  Positive Troponin  - suspected to be troponin leak  - no further work up per Dr Einar Gip as pt is not a candidate for strong anticoagulants due to his cancerous masses  Aspiration pneumonia, UTI  - Urine culture suggests contamination and blood cultures x2 negative to date  - Chest x-ray shows patchy infiltrate right base, likely aspiration pneumonia, started on Zosyn  - felt little short of breath this am so duoneb given  Hypertension  - continue hydralazine and norvasc but stop lisinopril as it may worsen renal function  - BP 129/56 this am, at goal ARF  -  probably dehydration or lisinopril  - stopped lisinopril 3/13; check BMP in am - creatinine improving with IV fluids, creatinine trended down 1.63 --> 1.45  Anemia and thrombocytopenia  - secondary to history of malignancy and sequela of chemotherapy  - follow up CBC in am Dysphagia  - Continue dysphagia 1 diet  Chronic systolic and diastolic CHF  - compensated   H/O Cocaine abuse and Tobacco abuse   Seizure disorder  - cont Keppra 1000 mg PO BID   Code Status: DNR/DNI  Family Communication: family not at the bedside  Disposition Plan: remains inpatient    Consultants:  Neurology  Cardiology  Palliative medicine  Procedures:  None Antibiotics:  IV Zosyn 3/9>   Leisa Lenz, MD  [facility] Pager [phone]  If 7PM-7AM, please contact night-coverage [email or web address] Password [facility] 03/14/2013, 7:17 AM   LOS: 7 days    HPI/Subjective: SAys he is breathing hard this am.  Objective: Filed Vitals:   03/13/13 1700 03/13/13 2000 03/14/13 0006 03/14/13 0400  BP: 122/68 121/57 111/61 122/63  Pulse: 86 99 105 102  Temp: 98.3 F (36.8 C) 98.6 F (37 C) 98.6 F (37 C) 98.8 F (37.1 C)  TempSrc: Oral Oral Oral Oral  Resp: 18 18 18 20   Height:      Weight:      SpO2: 98% 97% 95% 92%    Intake/Output Summary (Last 24 hours) at 03/14/13 0717 Last data filed at 03/14/13 0420  Gross per 24 hour  Intake     60 ml  Output    900 ml  Net   -840 ml    Exam:  General: Pt is sleeping this am, no acute distress  Cardiovascular: Regular rate and rhythm, S1/S2 appreciated  Respiratory: diminished breath sounds bilaterally, no wheezing  Abdomen: Soft, non tender, non distended, bowel sounds present, no guarding  Extremities: No edema, pulses DP and PT palpable bilaterally  Neuro: Left side weakness, no other focal neurologic deficits    Data Reviewed: Basic Metabolic Panel:  Recent Labs Lab 03/07/13 2136 03/07/13 2141 03/09/13 0300 03/12/13 0402  NA 140  140 139 136*  K 4.3 4.2 4.3 4.3  CL 101 104 100 99  CO2 26  --  24 23  GLUCOSE 101* 100* 77 95  BUN 34* 35* 23 18  CREATININE 1.63* 1.90* 1.39* 1.45*  CALCIUM 9.0  --  8.4 8.5   Liver Function Tests:  Recent Labs Lab 03/07/13 2136  AST 25  ALT 59*  ALKPHOS 49  BILITOT <0.2*  PROT 6.6  ALBUMIN 3.2*   No results found for this basename: LIPASE, AMYLASE,  in the last 168 hours No results found for this basename: AMMONIA,  in the last 168 hours CBC:  Recent Labs Lab 03/07/13 2136 03/07/13 2141 03/12/13 0402  WBC 4.0  --  3.9*  NEUTROABS 2.6  --   --   HGB 8.1* 8.8* 8.2*  HCT 24.5* 26.0* 25.0*  MCV 89.7  --  88.3  PLT 50*  --  120*   Cardiac Enzymes:  Recent Labs Lab 03/08/13 0530 03/08/13 1451 03/08/13 2155  TROPONINI 0.44* 0.41* 0.41*   BNP: No components found with this basename: POCBNP,  CBG: No results found for this basename: GLUCAP,  in the last 168 hours  Recent Results (from the past 240 hour(s))  URINE CULTURE     Status: None   Collection Time    03/09/13  1:18 AM      Result Value Ref Range Status   Specimen Description URINE, RANDOM   Final   Special Requests NONE   Final   Culture  Setup Time     Final   Value: 03/09/2013 11:48     Performed at [facility]     Final   Value: 30,000 COLONIES/ML     Performed at [facility]   Culture     Final   Value: Multiple bacterial morphotypes present, none predominant. Suggest appropriate recollection if clinically indicated.     Performed at [facility]   Report Status 03/11/2013 FINAL   Final  CULTURE, BLOOD (ROUTINE X 2)     Status: None   Collection Time    03/09/13  3:00 AM      Result Value Ref Range Status   Specimen Description BLOOD RIGHT HAND   Final   Special Requests BOTTLES DRAWN AEROBIC ONLY [facility]   Final   Culture  Setup Time     Final   Value: 03/09/2013 08:44     Performed at [facility]   Culture     Final   Value:         BLOOD CULTURE RECEIVED NO GROWTH TO DATE CULTURE WILL BE HELD FOR 5 DAYS BEFORE ISSUING A FINAL NEGATIVE REPORT     Performed at [facility]   Report Status PENDING   Incomplete  CULTURE, BLOOD (ROUTINE X 2)     Status: None   Collection Time    03/09/13  3:16 AM      Result Value Ref Range Status   Specimen Description BLOOD RIGHT HAND   Final   Special Requests BOTTLES DRAWN AEROBIC ONLY 5CC   Final   Culture  Setup Time     Final   Value: 03/09/2013 08:46     Performed at [facility]   Culture     Final   Value:        BLOOD CULTURE RECEIVED NO GROWTH TO DATE CULTURE WILL BE HELD FOR 5 DAYS BEFORE ISSUING A FINAL NEGATIVE REPORT     Performed at [facility]   Report Status PENDING   Incomplete     Studies: No results found.  Scheduled Meds: . amLODipine  10 mg Oral Daily  . aspirin  325 mg Oral Daily  . folic acid  1 mg Oral Daily  . hydrALAZINE  25 mg Oral 3 times per day  . levETIRAcetam  1,000 mg Oral BID  . pantoprazole  40 mg Oral Daily  . piperacillin-tazobactam   3.375 g Intravenous 3 times per day   Continuous Infusions: . sodium chloride 50 mL/hr at 03/13/13 1433

## 2013-03-15 LAB — CULTURE, BLOOD (ROUTINE X 2)
Culture: NO GROWTH
Culture: NO GROWTH

## 2013-03-15 MED ORDER — SENNOSIDES-DOCUSATE SODIUM 8.6-50 MG PO TABS: 1.0000 | ORAL_TABLET | Freq: Every evening | ORAL | Status: AC | PRN

## 2013-03-15 MED ORDER — LEVOFLOXACIN 750 MG PO TABS: 750.0000 mg | ORAL_TABLET | ORAL | Status: AC

## 2013-03-15 MED ORDER — MORPHINE SULFATE (CONCENTRATE) 10 MG /0.5 ML PO SOLN: 5.0000 mg | ORAL | Status: AC | PRN

## 2013-03-15 MED ORDER — ACETAMINOPHEN 325 MG PO TABS: 650.0000 mg | ORAL_TABLET | Freq: Four times a day (QID) | ORAL | Status: AC | PRN

## 2013-03-15 MED ORDER — IPRATROPIUM-ALBUTEROL 0.5-2.5 (3) MG/3ML IN SOLN: 3.0000 mL | RESPIRATORY_TRACT | Status: AC | PRN

## 2013-03-15 MED ORDER — LEVOFLOXACIN 750 MG PO TABS: 750.0000 mg | ORAL_TABLET | Freq: Every day | ORAL | Status: DC

## 2013-03-15 MED ORDER — OXYCODONE-ACETAMINOPHEN 5-325 MG PO TABS: 1.0000 | ORAL_TABLET | ORAL | Status: AC | PRN

## 2013-03-15 MED ORDER — LEVOFLOXACIN 750 MG PO TABS: 750.0000 mg | ORAL_TABLET | ORAL | Status: DC

## 2013-03-15 MED ORDER — ZOLPIDEM TARTRATE 10 MG PO TABS: 10.0000 mg | ORAL_TABLET | Freq: Every evening | ORAL | Status: AC | PRN

## 2013-03-15 NOTE — Discharge Instructions (Addendum)
Aspiration Pneumonia Aspiration pneumonia is an infection in your lungs. It occurs when food, liquid, or stomach contents (vomit) are inhaled (aspirated) into your lungs. When these things get into your lungs, swelling (inflammation) and infection can occur. This can make it difficult for you to breathe. Aspiration pneumonia is a serious condition and can be life threatening. RISK FACTORS Aspiration pneumonia is more likely to occur when a person's cough (gag) reflex or ability to swallow has been decreased. Some things that can do this include:   Having a brain injury or disease, such as stroke, seizures, Parkinson disease, dementia, or amyotrophic lateral sclerosis (ALS).   Being given general anesthetic for procedures.   Being in a coma (unconscious).   Having a narrowing of the tube that carries food to the stomach (esophagus).   Drinking too much alcohol. If a person passes out and vomits, vomit can be swallowed into the lungs.   Taking certain medicines, such as tranquilizers or sedatives.  SIGNS AND SYMPTOMS   Coughing after swallowing food or liquids.   Breathing problems, such as wheezing or shortness of breath.   Bluish skin. This can be caused by lack of oxygen.   Coughing up food or mucus. The mucus might contain blood, greenish material, or yellowish-white fluid (pus).   Fever.   Chest pain.   Being more tired than usual (fatigue).   Sweating more than usual.   Bad breath.  DIAGNOSIS  A physical exam will be done. During the exam, the health care provider will listen to your lungs with a stethoscope to check for:   Crackling sounds in the lungs.  Decreased breath sounds.  A rapid heartbeat. Various tests may be ordered. These may include:   Chest X-ray.   CT scan.   Swallowing study. This test looks at how food is swallowed and whether it goes into your breathing tube (trachea) or food pipe (esophagus).   Sputum culture. Saliva and  mucus (sputum) are collected from the lungs or the tubes that carry air to the lungs (bronchi). The sputum is then tested for bacteria.   Bronchoscopy. This test uses a flexible tube (bronchoscope) to see inside the lungs. TREATMENT  Treatment will usually include antibiotic medicines. Other medicines may also be used to reduce fever or pain. You may need to be treated in the hospital. In the hospital, your breathing will be carefully monitored. Depending on how well you are breathing, you may need to be given oxygen, or you may need breathing support from a breathing machine (ventilator). For people who fail a swallowing study, a feeding tube might be placed in the stomach, or they may be asked to avoid certain food textures or liquids when they eat. HOME CARE INSTRUCTIONS   Carefully follow any special eating instructions you were given, such as avoiding certain food textures or thickening liquids. This reduces the risk of developing aspiration pneumonia again.  Only take over-the-counter or prescription medicines as directed by your health care provider. Follow the directions carefully.   If you were prescribed antibiotics, take them as directed. Finish them even if you start to feel better.   Rest as instructed by your health care provider.   Keep all follow-up appointments with your health care provider.  SEEK MEDICAL CARE IF:   You develop worsening shortness of breath, wheezing, or difficulty breathing.   You develop a fever.   You have chest pain.  MAKE SURE YOU:   Understand these instructions.  Will watch your  condition.  Will get help right away if you are not doing well or get worse. Document Released: 10/15/2008 Document Revised: 08/20/2012 Document Reviewed: 06/05/2012 [facility] Patient Information 2014 [place].

## 2013-03-15 NOTE — Discharge Summary (Addendum)
Physician Discharge Summary  Carlos Beasley FTD:[id number] DOB: 1945/09/27 DOA: 03/07/2013  PCP: [name], MD  Admit date: 03/07/2013 Discharge date: 03/15/2013  Recommendations for Outpatient Follow-up:          Continue Levaquin Q 48 hours for total 3 doses  for aspiration pneumonia He is on dysphagia 1 diet Recheck CBC and BMP per SNF protocol Please call palliative care services once pt in SNF to follow and address goals of care Stopped lisinopril due to acute renal failure; please continue to monitor BP   Discharge Diagnoses:  Principal Problem:   CVA (cerebral infarction) Active Problems:   Tobacco abuse   HTN (hypertension)   Cocaine abuse   Seizure cerebral   Adenocarcinoma of lung, stage 4   Aspiration pneumonia   Palliative care encounter   Weakness generalized   Dysphagia, unspecified(787.20)    Discharge Condition: medically stable for discharge to SNF today  Diet recommendation: as tolerated, dysphagia 1 diet   History of present illness:  68 y.o. 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 In the ED, code stroke was called. CT head revealed no acute intracranial findings and improvement in the vasogenic edema surrounding the metastasis of the left parasagital. posterior cortex.   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  - continue Keppra for seizure prophylaxis  Positive Troponin  - suspected to be troponin leak  - no further work up per Dr Einar Gip as pt is not a candidate for strong anticoagulants due to his cancerous masses  Aspiration pneumonia, UTI  - Urine culture suggests contamination and blood cultures x2 negative to date  - Chest x-ray shows patchy infiltrate right base, likely aspiration pneumonia, started on Zosyn and will d/c with levaquin for 3 more doses on discharge  - duoneb as needed every 4 hours for shortness of breath or wheezing  Hypertension  - continue hydralazine and norvasc but stopped lisinopril as it may worsen renal function  ARF  - probably dehydration or lisinopril  - stopped lisinopril 3/13 - creatinine improving with IV fluids, creatinine trended down 1.63 --> 1.45  Anemia and thrombocytopenia  - secondary to history of malignancy and sequela of chemotherapy  Dysphagia  - Continue dysphagia 1 diet  Chronic systolic and diastolic CHF  - compensated  H/O Cocaine abuse and Tobacco abuse  Seizure disorder  - cont Keppra 1000 mg PO BID   Code Status: DNR/DNI  Family Communication: family not at the bedside  Disposition Plan: remains inpatient  Consultants:  Neurology  Cardiology  Palliative medicine  Procedures:  None Antibiotics:  IV Zosyn 3/9 --> 3/15; start Levaquin on discharge for 7 days   Signed:  Leisa Lenz, MD  [facility] 03/15/2013, 12:54 PM  Pager #: [phone]   Discharge Exam: Filed Vitals:   03/15/13 0758  BP: 125/66  Pulse: 94  Temp: 97.6 F (36.4 C)  Resp: 16   Filed Vitals:   03/15/13 0001 03/15/13 0545 03/15/13 0758 03/15/13 0929  BP: 113/61 140/67 125/66   Pulse: 87 108 94   Temp: 98.6 F (37 C) 97.6 F (36.4 C) 97.6 F (36.4 C)   TempSrc: Oral Oral Axillary   Resp: 16  20 16    Height:      Weight:      SpO2: 98% 95% 93% 94%    Exam:  General: Pt is in no acute distress  Cardiovascular: Regular rate and rhythm, S1/S2 appreciated  Respiratory: diminished breath sounds bilaterally, no wheezing  Abdomen: Soft, non tender, non distended, bowel sounds present, no guarding  Extremities: No edema, pulses DP and PT palpable bilaterally  Neuro: Left side weakness, no other focal neurologic deficits   Discharge Instructions  Discharge Orders   Future Appointments Provider Department Dept Phone   03/17/2013 8:15 AM [facility] [phone]   03/17/2013 8:45 AM [facility] [phone]   03/17/2013 9:30 AM [facility] [phone]   03/24/2013 8:15 AM [facility] [phone]   04/07/2013 10:00 AM [facility] [phone]   Future Orders Complete By Expires   Call MD for:  difficulty breathing, headache or visual disturbances  As directed    Call MD for:  persistant dizziness or light-headedness  As directed    Call MD for:  persistant nausea and vomiting  As directed    Call MD for:  severe uncontrolled pain  As directed    Diet - low sodium heart healthy  As directed    Discharge instructions  As directed    Comments:     He is on dysphagia 1 diet Recheck CBC and BMP per SNF protocol Please call palliative care services once pt in SNF to follow and address goals of care   Increase activity slowly  As directed        Medication List    STOP taking these medications       dexamethasone 4 MG tablet  Commonly known as:  DECADRON     lisinopril 5 MG tablet  Commonly known as:  PRINIVIL,ZESTRIL      TAKE these medications       acetaminophen 325 MG tablet  Commonly known as:  TYLENOL  Take 2 tablets (650 mg  total) by mouth every 6 (six) hours as needed for mild pain, fever or headache.     amLODipine 10 MG tablet  Commonly known as:  NORVASC  Take 1 tablet (10 mg total) by mouth daily.     aspirin 325 MG EC tablet  Take 1 tablet (325 mg total) by mouth daily.     folic acid 1 MG tablet  Commonly known as:  FOLVITE  Take 1 tablet (1 mg total) by mouth daily.     hydrALAZINE 25 MG tablet  Commonly known as:  APRESOLINE  Take 1 tablet (25 mg total) by mouth every 8 (eight) hours.     ipratropium-albuterol 0.5-2.5 (3) MG/3ML Soln  Commonly known as:  DUONEB  Take 3 mLs by nebulization every 4 (four) hours as needed (for shortness of  breath).     levETIRAcetam 1000 MG tablet  Commonly known as:  KEPPRA  Take 1 tablet (1,000 mg total) by mouth 2 (two) times daily.     levofloxacin 750 MG tablet  Commonly known as:  LEVAQUIN  Take 1 tablet (750 mg total) by mouth every other day.     morphine CONCENTRATE 10 mg / 0.5 ml concentrated solution  Take 0.25 mLs (5 mg total) by mouth every 4 (four) hours as needed for moderate pain.     oxyCODONE-acetaminophen 5-325 MG per tablet  Commonly known as:  PERCOCET/ROXICET  Take 1 tablet by mouth every 4 (four) hours as needed for severe pain.     pantoprazole 40 MG tablet  Commonly known as:  PROTONIX  Take 1 tablet (40 mg total) by mouth daily.     prochlorperazine 10 MG tablet  Commonly known as:  COMPAZINE  Take 1 tablet (10 mg total) by mouth every 6 (six) hours as needed for nausea or vomiting.     senna-docusate 8.6-50 MG per tablet  Commonly known as:  Senokot-S  Take 1 tablet by mouth at bedtime as needed for moderate constipation.     zolpidem 10 MG tablet  Commonly known as:  AMBIEN  Take 1 tablet (10 mg total) by mouth at bedtime as needed for sleep. With 1 refill called rx to [facility]            Follow-up Information   Follow up with [name], MD. Schedule an appointment as soon as possible for a  visit in 2 weeks.   Specialty:  Internal Medicine   Contact information:   [place] [id number] [phone]        The results of significant diagnostics from this hospitalization (including imaging, microbiology, ancillary and laboratory) are listed below for reference.    Significant Diagnostic Studies: Dg Chest 2 View  03/08/2013   CLINICAL DATA:  Cerebral infarction and left upper extremity weakness. History of lung carcinoma.  EXAM: CHEST - 2 VIEW  COMPARISON:  DG CHEST 2 VIEW dated 02/21/2013; MR MRA HEAD W/O CM dated 03/08/2013; CT HEAD W/O CM dated 03/07/2013; NM PET IMAGE INITIAL (PI) SKULL BASE TO THIGH dated 01/26/2013; US BIOPSY dated 01/06/2013; CT CHEST W/CM dated 01/04/2013  FINDINGS: Stable cardiomegaly and mild chronic lung disease without overt edema, airspace consolidation or pleural effusion. No masses are identified. The bony thorax is unremarkable.  IMPRESSION: Stable chronic lung disease and cardiomegaly.   Electronically Signed   By: Aletta Edouard M.D.   On: 03/08/2013 13:41   Dg Chest 2 View  02/21/2013   CLINICAL DATA:  Hypertension, history of metastatic lung cancer  EXAM: CHEST  2 VIEW  COMPARISON:  07/11/2012  FINDINGS: Stable heart size and vascularity. Lungs remain clear. 7 mm right upper low spiculated density, faintly visualized by chest x-ray overlying the first anterior rib shadow. This is stable in size. No new superimposed pneumonia, collapse or consolidation. No edema, effusion or pneumothorax. Trachea midline.  IMPRESSION: Stable chest exam.  No superimposed acute process   Electronically Signed   By: Daryll Brod M.D.   On: 02/21/2013 12:20   Ct Head Wo Contrast  03/07/2013   CLINICAL DATA:  Code stroke, left face and extremity weakness. 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  EXAM: CT HEAD WITHOUT CONTRAST  TECHNIQUE: Contiguous axial images were obtained from the base of the skull through the vertex  without contrast.  COMPARISON:  NM PET IMAGE INITIAL (PI) SKULL BASE TO THIGH dated 01/26/2013; MR HEAD WO/W CM dated 01/13/2013; MR HEAD WO/W CM dated 01/03/2013; CT HEAD W/O CM dated 01/02/2013  FINDINGS: Previous areas of vasogenic edema related to intracranial metastatic disease have largely resolved. Slight residual hypodensity in the left parasagittal posterior frontal cortex representing the involuting larger metastasis. No acute stroke, acute hemorrhage, significant mass lesion, hydrocephalus, or extra-axial fluid. Generalized atrophy. Small vessel disease. No fracture or osseous destructive lesion. Clear sinuses. No mastoid fluid.  IMPRESSION: Atrophy and small vessel disease.  No acute intracranial findings.  Critical Value/emergent results were called by telephone at the time of interpretation on 03/07/2013 at 9:52 PM to Stroke neurologist who verbally acknowledged these results.   Electronically Signed   By: Rolla Flatten M.D.   On: 03/07/2013 20:56   Mr Brain Wo Contrast  03/08/2013   CLINICAL DATA:  Acute onset left-sided weakness. History of metastatic non-small cell lung cancer with recent brain radiation therapy.  EXAM: MRI HEAD WITHOUT CONTRAST  MRA HEAD WITHOUT CONTRAST  TECHNIQUE: Multiplanar, multiecho pulse sequences of the brain and surrounding structures were obtained without intravenous contrast. Angiographic images of the head were obtained using MRA technique without contrast.  COMPARISON:  CT HEAD W/O CM dated 03/07/2013; MR HEAD WO/W CM dated 01/13/2013  FINDINGS: MRI HEAD FINDINGS  There is a small region of mildly restricted diffusion predominantly involving the posterior right frontal lobe and corona radiata with extension superiorly into the postcentral gyrus and inferiorly into the posterior insula. There is only faint associated T2 hyperintensity. Two small foci of restricted diffusion in the cerebellar hemispheres measure 10 mm on the right and 3 mm on the left. There is a small focus of  susceptibility artifact associated with the diffusion abnormality in the right insula, compatible with a small amount of hemorrhage. Remote focus of hemorrhage in the left parietal lobe is unchanged. There may be a tiny focus of microhemorrhage in the anterior right temporal lobe.  Vasogenic edema in the left parietal lobe associated with the previously described metastasis has nearly completely resolved. Left cerebellar vasogenic edema has completely resolved. Patchy foci of T2 hyperintensity in the subcortical and periventricular white matter are similar to the prior study and compatible with mild chronic small vessel ischemic disease. There is mild generalized cerebral atrophy. There is no midline shift or extra-axial fluid collection.  Orbits are unremarkable. There is trace right maxillary sinus mucosal thickening. Mastoid air cells are clear. Major intracranial vascular flow voids are preserved.  MRA HEAD FINDINGS  Visualized distal vertebral arteries are patent with the left being dominant. There is mild narrowing of the distal right vertebral artery at the vertebrobasilar junction. Left PICA origin is patent. Right PICA is not identified. AICA origins and SCA origins are patent. The basilar artery is mildly irregular diffusely with mild narrowing in its midportion. There is a moderate to severe focal stenosis of the left PCA near its origin. The left posterior communicating artery appears irregular with apparent narrowing distally, however this may be due to motion artifact. There is mild bilateral MCA irregularity.  Internal carotid arteries are patent from skullbase to carotid termini. There is mild bilateral carotid siphon irregularity. There is apparent mild to moderate narrowing of the distal left supraclinoid ICA, although this is also in a region of motion artifact. 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 Left A2 is supplied by the anterior communicating artery. There is mild bilateral proximal  MCA irregularity without evidence of significant stenosis. No intracranial aneurysm is identified.  IMPRESSION: 1. New, mild diffusion abnormality in the right frontoparietal region as well as small foci of diffusion abnormality in both cerebellar hemispheres. These findings are compatible with acute to early subacute infarcts, however new/underlying metastases cannot be excluded given the lack of IV contrast. 2. Near complete interval resolution of vasogenic edema associated with the two previously described metastases. 3. Intracranial atherosclerotic change with mild narrowing of the distal right vertebral artery and mid basilar artery, mild to moderate narrowing of the supraclinoid left ICA, and apparent moderate to severe proximal left PCA stenosis. However, there is some motion artifact which may account for some of this appearance. If clinically desired, repeat MRA imaging could be performed.   Electronically Signed   By: Logan Bores   On: 03/08/2013 14:48   Dg Chest Port 1 View  03/09/2013   CLINICAL DATA:  fever  EXAM: PORTABLE CHEST - 1 VIEW  COMPARISON:  March 08, 2013  FINDINGS: There is patchy interstitial infiltrate in the right base. Lungs are otherwise clear. Heart is enlarged with normal pulmonary vascularity. Aorta is prominent but stable. No adenopathy.  IMPRESSION: Patchy interstitial infiltrate right base.  Stable cardiomegaly.   Electronically Signed   By: Lowella Grip M.D.   On: 03/09/2013 08:09   Mr Jodene Nam Head/brain Wo Cm  03/08/2013   CLINICAL DATA:  Acute onset left-sided weakness. History of metastatic non-small cell lung cancer with recent brain radiation therapy.  EXAM: MRI HEAD WITHOUT CONTRAST  MRA HEAD WITHOUT CONTRAST  TECHNIQUE: Multiplanar, multiecho pulse sequences of the brain and surrounding structures were obtained without intravenous contrast. Angiographic images of the head were obtained using MRA technique without contrast.  COMPARISON:  CT HEAD W/O CM dated 03/07/2013;  MR HEAD WO/W CM dated 01/13/2013  FINDINGS: MRI HEAD FINDINGS  There is a small region of mildly restricted diffusion predominantly involving the posterior right frontal lobe and corona radiata with extension superiorly into the postcentral gyrus and inferiorly into the posterior insula. There is only faint associated T2 hyperintensity. Two small foci of restricted diffusion in the cerebellar hemispheres measure 10 mm on the right and 3 mm on the left. There is a small focus of susceptibility artifact associated with the diffusion abnormality in the right insula, compatible with a small amount of hemorrhage. Remote focus of hemorrhage in the left parietal lobe is unchanged. There may be a tiny focus of microhemorrhage in the anterior right temporal lobe.  Vasogenic edema in the left parietal lobe associated with the previously described metastasis has nearly completely resolved. Left cerebellar vasogenic edema has completely resolved. Patchy foci of T2 hyperintensity in the subcortical and periventricular white matter are similar to the prior study and compatible with mild chronic small vessel ischemic disease. There is mild generalized cerebral atrophy. There is no midline shift or extra-axial fluid collection.  Orbits are unremarkable. There is trace right maxillary sinus mucosal thickening. Mastoid air cells are clear. Major intracranial vascular flow voids are preserved.  MRA HEAD FINDINGS  Visualized distal vertebral arteries are patent with the left being dominant. There is mild narrowing of the distal right vertebral artery at the vertebrobasilar junction. Left PICA origin is patent. Right PICA is not identified. AICA origins and SCA origins are patent. The basilar artery is mildly irregular diffusely with mild narrowing in its midportion. There is a moderate to severe focal stenosis of the left PCA near its origin. The left posterior  communicating artery appears irregular with apparent narrowing distally,  however this may be due to motion artifact. There is mild bilateral MCA irregularity.  Internal carotid arteries are patent from skullbase to carotid termini. There is mild bilateral carotid siphon irregularity. There is apparent mild to moderate narrowing of the distal left supraclinoid ICA, although this is also in a region of motion artifact. 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 Left A2 is supplied by the anterior communicating artery. There is mild bilateral proximal MCA irregularity without evidence of significant stenosis. No intracranial aneurysm is identified.  IMPRESSION: 1. New, mild diffusion abnormality in the right frontoparietal region as well as small foci of diffusion abnormality in both cerebellar hemispheres. These findings are compatible with acute to early subacute infarcts, however new/underlying metastases cannot be excluded given the lack of IV contrast. 2. Near complete interval resolution of vasogenic edema associated with the two previously described metastases. 3. Intracranial atherosclerotic change with mild narrowing of the distal right vertebral artery and mid basilar artery, mild to moderate narrowing of the supraclinoid left ICA, and apparent moderate to severe proximal left PCA stenosis. However, there is some motion artifact which may account for some of this appearance. If clinically desired, repeat MRA imaging could be performed.   Electronically Signed   By: Logan Bores   On: 03/08/2013 14:48    Microbiology: Recent Results (from the past 240 hour(s))  URINE CULTURE     Status: None   Collection Time    03/09/13  1:18 AM      Result Value Ref Range Status   Specimen Description URINE, RANDOM   Final   Special Requests NONE   Final   Culture  Setup Time     Final   Value: 03/09/2013 11:48     Performed at [facility]     Final   Value: 30,000 COLONIES/ML     Performed at [facility]   Culture     Final   Value: Multiple  bacterial morphotypes present, none predominant. Suggest appropriate recollection if clinically indicated.     Performed at [facility]   Report Status 03/11/2013 FINAL   Final  CULTURE, BLOOD (ROUTINE X 2)     Status: None   Collection Time    03/09/13  3:00 AM      Result Value Ref Range Status   Specimen Description BLOOD RIGHT HAND   Final   Special Requests BOTTLES DRAWN AEROBIC ONLY [facility]   Final   Culture  Setup Time     Final   Value: 03/09/2013 08:44     Performed at [facility]   Culture     Final   Value:        BLOOD CULTURE RECEIVED NO GROWTH TO DATE CULTURE WILL BE HELD FOR 5 DAYS BEFORE ISSUING A FINAL NEGATIVE REPORT     Performed at [facility]   Report Status PENDING   Incomplete  CULTURE, BLOOD (ROUTINE X 2)     Status: None   Collection Time    03/09/13  3:16 AM      Result Value Ref Range Status   Specimen Description BLOOD RIGHT HAND   Final   Special Requests BOTTLES DRAWN AEROBIC ONLY [facility]   Final   Culture  Setup Time     Final   Value: 03/09/2013 08:46     Performed at [facility]  Final   Value:        BLOOD CULTURE RECEIVED NO GROWTH TO DATE CULTURE WILL BE HELD FOR 5 DAYS BEFORE ISSUING A FINAL NEGATIVE REPORT     Performed at [facility]   Report Status PENDING   Incomplete     Labs: Basic Metabolic Panel:  Recent Labs Lab 03/09/13 0300 03/12/13 0402  NA 139 136*  K 4.3 4.3  CL 100 99  CO2 24 23  GLUCOSE 77 95  BUN 23 18  CREATININE 1.39* 1.45*  CALCIUM 8.4 8.5   Liver Function Tests: No results found for this basename: AST, ALT, ALKPHOS, BILITOT, PROT, ALBUMIN,  in the last 168 hours No results found for this basename: LIPASE, AMYLASE,  in the last 168 hours No results found for this basename: AMMONIA,  in the last 168 hours CBC:  Recent Labs Lab 03/12/13 0402  WBC 3.9*  HGB 8.2*  HCT 25.0*  MCV 88.3  PLT 120*   Cardiac Enzymes:  Recent Labs Lab 03/08/13 1451  03/08/13 2155  TROPONINI 0.41* 0.41*   BNP: BNP (last 3 results) No results found for this basename: PROBNP,  in the last 8760 hours CBG: No results found for this basename: GLUCAP,  in the last 168 hours  Time coordinating discharge: Over 30 minutes

## 2013-03-15 NOTE — Plan of Care (Signed)
Problem: Discharge/Transitional Outcomes Goal: PCP appointment made and transportation plan in place Outcome: Completed/Met Date Met:  03/15/13 Follow through to be done by SNF

## 2013-03-15 NOTE — Clinical Social Work Note (Signed)
CSW received call that MD has changed patient's medication on DC summary. CSW faxed updated DC summary to facility.  Liz Beach, [place], [place], [phone]

## 2013-03-15 NOTE — Clinical Social Work Placement (Signed)
Clinical Social Work Department CLINICAL SOCIAL WORK PLACEMENT NOTE 03/15/2013  Patient:  Carlos Beasley, Carlos Beasley  Account Number:  [id number] Admit date:  03/07/2013  Clinical Social Worker:  Adair Laundry  Date/time:  03/09/2013 03:30 PM  Clinical Social Work is seeking post-discharge placement for this patient at the following level of care:   SKILLED NURSING   (*CSW will update this form in Epic as items are completed)   03/09/2013  Patient/family provided with [facility] Department of Clinical Social Work's list of facilities offering this level of care within the geographic area requested by the patient (or if unable, by the patient's family).  03/09/2013  Patient/family informed of their freedom to choose among providers that offer the needed level of care, that participate in Medicare, Medicaid or managed care program needed by the patient, have an available bed and are willing to accept the patient.  03/09/2013  Patient/family informed of [facility]' ownership interest in [facility], as well as of the fact that they are under no obligation to receive care at this facility.  PASARR submitted to EDS on 03/09/2013 PASARR number received from EDS on 03/09/2013  FL2 transmitted to all facilities in geographic area requested by pt/family on  03/09/2013 FL2 transmitted to all facilities within larger geographic area on   Patient informed that his/her managed care company has contracts with or will negotiate with  certain facilities, including the following:     Patient/family informed of bed offers received:  03/14/2013 Patient chooses bed at [facility] Physician recommends and patient chooses bed at    Patient to be transferred to [facility] on  03/15/2013 Patient to be transferred to facility by Ambulance  The following physician request were entered in Epic:   Additional Comments: Per MD patient ready for Dc to [facility]. RN, patient's sons, and facility notified of DC. RN given number for report. Dc packet with RN (including signed DNR and prescriptions). CSW has requested next available transport for patient. CSW signing off at this time.   Liz Beach, [place], [place], [phone]

## 2013-03-17 ENCOUNTER — Ambulatory Visit: Payer: Medicare Other | Admitting: Physician Assistant

## 2013-03-17 ENCOUNTER — Ambulatory Visit: Payer: Medicare Other

## 2013-03-17 ENCOUNTER — Other Ambulatory Visit: Payer: Medicare Other

## 2013-03-20 ENCOUNTER — Emergency Department (HOSPITAL_COMMUNITY): Payer: Medicare Other

## 2013-03-20 ENCOUNTER — Inpatient Hospital Stay (HOSPITAL_COMMUNITY)
Admission: EM | Admit: 2013-03-20 | Discharge: 2013-04-01 | DRG: 871 | Disposition: E | Payer: Medicare Other | Source: Skilled Nursing Facility | Attending: Internal Medicine | Admitting: Internal Medicine

## 2013-03-20 ENCOUNTER — Encounter (HOSPITAL_COMMUNITY): Payer: Self-pay | Admitting: Emergency Medicine

## 2013-03-20 DIAGNOSIS — I10 Essential (primary) hypertension: Secondary | ICD-10-CM | POA: Diagnosis present

## 2013-03-20 DIAGNOSIS — Z7982 Long term (current) use of aspirin: Secondary | ICD-10-CM

## 2013-03-20 DIAGNOSIS — C3490 Malignant neoplasm of unspecified part of unspecified bronchus or lung: Secondary | ICD-10-CM | POA: Diagnosis present

## 2013-03-20 DIAGNOSIS — E874 Mixed disorder of acid-base balance: Secondary | ICD-10-CM | POA: Diagnosis present

## 2013-03-20 DIAGNOSIS — A419 Sepsis, unspecified organism: Principal | ICD-10-CM | POA: Diagnosis present

## 2013-03-20 DIAGNOSIS — R579 Shock, unspecified: Secondary | ICD-10-CM | POA: Diagnosis present

## 2013-03-20 DIAGNOSIS — E872 Acidosis, unspecified: Secondary | ICD-10-CM

## 2013-03-20 DIAGNOSIS — Z515 Encounter for palliative care: Secondary | ICD-10-CM

## 2013-03-20 DIAGNOSIS — R64 Cachexia: Secondary | ICD-10-CM | POA: Diagnosis present

## 2013-03-20 DIAGNOSIS — I6789 Other cerebrovascular disease: Secondary | ICD-10-CM

## 2013-03-20 DIAGNOSIS — C7931 Secondary malignant neoplasm of brain: Secondary | ICD-10-CM | POA: Diagnosis present

## 2013-03-20 DIAGNOSIS — N179 Acute kidney failure, unspecified: Secondary | ICD-10-CM | POA: Diagnosis present

## 2013-03-20 DIAGNOSIS — Z66 Do not resuscitate: Secondary | ICD-10-CM | POA: Diagnosis present

## 2013-03-20 DIAGNOSIS — G40909 Epilepsy, unspecified, not intractable, without status epilepticus: Secondary | ICD-10-CM | POA: Diagnosis present

## 2013-03-20 DIAGNOSIS — I428 Other cardiomyopathies: Secondary | ICD-10-CM | POA: Diagnosis present

## 2013-03-20 DIAGNOSIS — Z801 Family history of malignant neoplasm of trachea, bronchus and lung: Secondary | ICD-10-CM

## 2013-03-20 DIAGNOSIS — E870 Hyperosmolality and hypernatremia: Secondary | ICD-10-CM | POA: Diagnosis present

## 2013-03-20 DIAGNOSIS — C7949 Secondary malignant neoplasm of other parts of nervous system: Secondary | ICD-10-CM

## 2013-03-20 DIAGNOSIS — Z923 Personal history of irradiation: Secondary | ICD-10-CM

## 2013-03-20 DIAGNOSIS — I69959 Hemiplegia and hemiparesis following unspecified cerebrovascular disease affecting unspecified side: Secondary | ICD-10-CM

## 2013-03-20 DIAGNOSIS — Z681 Body mass index (BMI) 19 or less, adult: Secondary | ICD-10-CM

## 2013-03-20 DIAGNOSIS — I639 Cerebral infarction, unspecified: Secondary | ICD-10-CM | POA: Diagnosis present

## 2013-03-20 DIAGNOSIS — I420 Dilated cardiomyopathy: Secondary | ICD-10-CM | POA: Diagnosis present

## 2013-03-20 DIAGNOSIS — R6520 Severe sepsis without septic shock: Secondary | ICD-10-CM

## 2013-03-20 DIAGNOSIS — Z79899 Other long term (current) drug therapy: Secondary | ICD-10-CM

## 2013-03-20 DIAGNOSIS — I4891 Unspecified atrial fibrillation: Secondary | ICD-10-CM | POA: Diagnosis present

## 2013-03-20 DIAGNOSIS — R6521 Severe sepsis with septic shock: Secondary | ICD-10-CM

## 2013-03-20 DIAGNOSIS — F17200 Nicotine dependence, unspecified, uncomplicated: Secondary | ICD-10-CM | POA: Diagnosis present

## 2013-03-20 DIAGNOSIS — I6350 Cerebral infarction due to unspecified occlusion or stenosis of unspecified cerebral artery: Secondary | ICD-10-CM

## 2013-03-20 LAB — URINALYSIS, ROUTINE W REFLEX MICROSCOPIC
GLUCOSE, UA: NEGATIVE mg/dL
Hgb urine dipstick: NEGATIVE
KETONES UR: 15 mg/dL — AB
NITRITE: POSITIVE — AB
PH: 5 (ref 5.0–8.0)
Protein, ur: 30 mg/dL — AB
SPECIFIC GRAVITY, URINE: 1.025 (ref 1.005–1.030)
Urobilinogen, UA: 0.2 mg/dL (ref 0.0–1.0)

## 2013-03-20 LAB — URINE MICROSCOPIC-ADD ON

## 2013-03-20 LAB — I-STAT TROPONIN, ED: Troponin i, poc: 0.13 ng/mL (ref 0.00–0.08)

## 2013-03-20 LAB — COMPREHENSIVE METABOLIC PANEL
ALBUMIN: 2.9 g/dL — AB (ref 3.5–5.2)
ALT: 39 U/L (ref 0–53)
AST: 61 U/L — AB (ref 0–37)
Alkaline Phosphatase: 77 U/L (ref 39–117)
BUN: 74 mg/dL — ABNORMAL HIGH (ref 6–23)
CO2: 15 meq/L — AB (ref 19–32)
CREATININE: 4.14 mg/dL — AB (ref 0.50–1.35)
Calcium: 9.9 mg/dL (ref 8.4–10.5)
Chloride: 106 mEq/L (ref 96–112)
GFR calc Af Amer: 16 mL/min — ABNORMAL LOW (ref >90)
GFR, EST NON AFRICAN AMERICAN: 14 mL/min — AB (ref >90)
Glucose, Bld: 129 mg/dL — ABNORMAL HIGH (ref 70–99)
Potassium: 6 mEq/L — ABNORMAL HIGH (ref 3.7–5.3)
SODIUM: 154 meq/L — AB (ref 137–147)
Total Bilirubin: 0.6 mg/dL (ref 0.3–1.2)
Total Protein: 7.9 g/dL (ref 6.0–8.3)

## 2013-03-20 LAB — I-STAT VENOUS BLOOD GAS, ED
Acid-base deficit: 15 mmol/L — ABNORMAL HIGH (ref 0.0–2.0)
BICARBONATE: 16.6 meq/L — AB (ref 20.0–24.0)
O2 Saturation: 26 %
PH VEN: 6.993 — AB (ref 7.250–7.300)
PO2 VEN: 22 mmHg — AB (ref 30.0–45.0)
Patient temperature: 33
TCO2: 19 mmol/L (ref 0–100)
pCO2, Ven: 64 mmHg — ABNORMAL HIGH (ref 45.0–50.0)

## 2013-03-20 LAB — LACTIC ACID, PLASMA: LACTIC ACID, VENOUS: 11.5 mmol/L — AB (ref 0.5–2.2)

## 2013-03-20 LAB — PROTIME-INR
INR: 1.48 (ref 0.00–1.49)
Prothrombin Time: 17.5 seconds — ABNORMAL HIGH (ref 11.6–15.2)

## 2013-03-20 LAB — AMMONIA: AMMONIA: 26 umol/L (ref 11–60)

## 2013-03-20 LAB — CBG MONITORING, ED: Glucose-Capillary: 70 mg/dL (ref 70–99)

## 2013-03-20 LAB — TYPE AND SCREEN
ABO/RH(D): AB POS
Antibody Screen: NEGATIVE

## 2013-03-20 LAB — PRO B NATRIURETIC PEPTIDE: PRO B NATRI PEPTIDE: 442.6 pg/mL — AB (ref 0–125)

## 2013-03-20 LAB — I-STAT CG4 LACTIC ACID, ED: Lactic Acid, Venous: 12.08 mmol/L — ABNORMAL HIGH (ref 0.5–2.2)

## 2013-03-20 MED ORDER — SODIUM CHLORIDE 0.9 % IV SOLN: 250.0000 mL | INTRAVENOUS | Status: DC | PRN

## 2013-03-20 MED ORDER — POLYVINYL ALCOHOL 1.4 % OP SOLN
1.0000 [drp] | Freq: Four times a day (QID) | OPHTHALMIC | Status: DC | PRN
  Filled 2013-03-20: qty 15

## 2013-03-20 MED ORDER — DEXTROSE 50 % IV SOLN
INTRAVENOUS | Status: AC
  Administered 2013-03-20: 50 mL
  Filled 2013-03-20: qty 50

## 2013-03-20 MED ORDER — NOREPINEPHRINE BITARTRATE 1 MG/ML IJ SOLN
2.0000 ug/min | INTRAVENOUS | Status: DC
  Administered 2013-03-20: 2 ug/min via INTRAVENOUS
  Filled 2013-03-20: qty 4

## 2013-03-20 MED ORDER — ACETAMINOPHEN 650 MG RE SUPP: 650.0000 mg | Freq: Four times a day (QID) | RECTAL | Status: DC | PRN

## 2013-03-20 MED ORDER — SODIUM CHLORIDE 0.9 % IJ SOLN: 3.0000 mL | Freq: Two times a day (BID) | INTRAMUSCULAR | Status: DC

## 2013-03-20 MED ORDER — SODIUM CHLORIDE 0.9 % IV BOLUS (SEPSIS)
1000.0000 mL | Freq: Once | INTRAVENOUS | Status: AC
  Administered 2013-03-20: 1000 mL via INTRAVENOUS

## 2013-03-20 MED ORDER — ONDANSETRON HCL 4 MG PO TABS: 4.0000 mg | ORAL_TABLET | Freq: Four times a day (QID) | ORAL | Status: DC | PRN

## 2013-03-20 MED ORDER — MORPHINE SULFATE 2 MG/ML IJ SOLN
1.0000 mg | INTRAMUSCULAR | Status: DC | PRN
  Administered 2013-03-20: 1 mg via INTRAVENOUS
  Filled 2013-03-20: qty 1

## 2013-03-20 MED ORDER — PIPERACILLIN-TAZOBACTAM 3.375 G IVPB 30 MIN
3.3750 g | Freq: Once | INTRAVENOUS | Status: AC
  Administered 2013-03-20: 3.375 g via INTRAVENOUS
  Filled 2013-03-20: qty 50

## 2013-03-20 MED ORDER — ONDANSETRON HCL 4 MG/2ML IJ SOLN: 4.0000 mg | Freq: Four times a day (QID) | INTRAMUSCULAR | Status: DC | PRN

## 2013-03-20 MED ORDER — ATROPINE SULFATE 1 % OP SOLN: 4.0000 [drp] | OPHTHALMIC | Status: DC | PRN

## 2013-03-20 MED ORDER — VANCOMYCIN HCL 10 G IV SOLR
1500.0000 mg | Freq: Once | INTRAVENOUS | Status: AC
  Administered 2013-03-20: 1500 mg via INTRAVENOUS
  Filled 2013-03-20: qty 1500

## 2013-03-20 MED ORDER — LORAZEPAM 2 MG/ML IJ SOLN: 1.0000 mg | INTRAMUSCULAR | Status: DC | PRN

## 2013-03-20 MED ORDER — ACETAMINOPHEN 325 MG PO TABS
650.0000 mg | ORAL_TABLET | Freq: Four times a day (QID) | ORAL | Status: DC | PRN
Start: 2013-03-20 — End: 2013-03-21

## 2013-03-20 MED ORDER — SODIUM CHLORIDE 0.9 % IJ SOLN: 3.0000 mL | INTRAMUSCULAR | Status: DC | PRN

## 2013-03-20 NOTE — H&P (Signed)
[facility] History and Physical  Patient: Carlos Beasley  WUJ:[id number]  DOB: [birth year]  DOS: the patient was seen and examined on 03/22/2013 PCP: No primary provider on file.  Chief Complaint: Altered mental status  HPI: ALIOUNE HODGKIN is a 68 y.o. male with Past medical history of stage IV lung cancer, radiation, hypertension, recent CVA of MCA territory, dilated cardiomyopathy. The patient is coming from SNF. The patient was brought in from the nursing facility. Patient was found on the floor with his head up in the railing the history was obtained from ED physician and documentation since the patient was unable to provide any history.At the time of his arrival the patient was severely hypotensive with blood pressure in 60s elevated lactate of 12 and pH less than 7. He was started on IV pressors vancomycin and Zosyn critical care was consulted. Critical care discussed the case with his son was the power of attorney and decided not to pursue any aggressive measures, keep the patient on comfort care protocol, make the patient DNR/DNI. Hospitalist was asked to admit the patient  Review of Systems: as mentioned in the history of present illness.  A Comprehensive review of the other systems is negative.  Past Medical History  Diagnosis Date  . Hypertension   . Lung cancer   . Hx of radiation therapy 01/19/13    left parietal, lt cerebellar   Past Surgical History  Procedure Laterality Date  . No past surgeries     Social History:  reports that he has been smoking Cigarettes.  He has a 12.5 pack-year smoking history. He has never used smokeless tobacco. He reports that he drinks alcohol. 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  No Known Allergies  Family History  Problem Relation Age of Onset  . Heart Problems Mother   . Cancer Mother 83    lung cancer  . Cancer Father 43    pancreas cancer    Prior to Admission medications    Medication Sig Start Date End Date Taking? Authorizing Provider  acetaminophen (TYLENOL) 325 MG tablet Take 2 tablets (650 mg total) by mouth every 6 (six) hours as needed for mild pain, fever or headache. 03/15/13  Yes Robbie Lis, MD  amLODipine (NORVASC) 10 MG tablet Take 1 tablet (10 mg total) by mouth daily. 01/09/13  Yes Estela Leonie Green, MD  aspirin EC 325 MG EC tablet Take 1 tablet (325 mg total) by mouth daily. 01/09/13  Yes Estela Leonie Green, MD  feeding supplement (BOOST HIGH PROTEIN) LIQD Take 120 mLs by mouth 3 (three) times daily between meals.   Yes Historical Provider, MD  folic acid (FOLVITE) 1 MG tablet Take 1 tablet (1 mg total) by mouth daily. 01/26/13  Yes Curt Bears, MD  hydrALAZINE (APRESOLINE) 25 MG tablet Take 1 tablet (25 mg total) by mouth every 8 (eight) hours. 01/09/13  Yes Estela Leonie Green, MD  ipratropium-albuterol (DUONEB) 0.5-2.5 (3) MG/3ML SOLN Take 3 mLs by nebulization every 4 (four) hours as needed (for shortness of breath). 03/15/13  Yes Robbie Lis, MD  levETIRAcetam (KEPPRA) 1000 MG tablet Take 1 tablet (1,000 mg total) by mouth 2 (two) times daily. 01/09/13  Yes Estela Leonie Green, MD  levofloxacin (LEVAQUIN) 750 MG tablet Take 1 tablet (750 mg total) by mouth every other day. 03/15/13  Yes Robbie Lis, MD  Morphine Sulfate (MORPHINE CONCENTRATE) 10 mg / 0.5  ml concentrated solution Take 0.25 mLs (5 mg total) by mouth every 4 (four) hours as needed for moderate pain. 03/15/13  Yes Robbie Lis, MD  oxyCODONE-acetaminophen (PERCOCET/ROXICET) 5-325 MG per tablet Take 1 tablet by mouth every 4 (four) hours as needed for severe pain. 03/15/13  Yes Robbie Lis, MD  pantoprazole (PROTONIX) 40 MG tablet Take 1 tablet (40 mg total) by mouth daily. 01/09/13  Yes Estela Leonie Green, MD  senna-docusate (SENOKOT-S) 8.6-50 MG per tablet Take 1 tablet by mouth at bedtime as needed for moderate constipation. 03/15/13  Yes Robbie Lis, MD   tuberculin 5 UNIT/0.1ML injection Inject 5 Units into the skin once.   Yes Historical Provider, MD  zolpidem (AMBIEN) 10 MG tablet Take 1 tablet (10 mg total) by mouth at bedtime as needed for sleep. With 1 refill called rx to [facility] [phone] 03/15/13  Yes Alma Vivi Ferns, MD  prochlorperazine (COMPAZINE) 10 MG tablet Take 1 tablet (10 mg total) by mouth every 6 (six) hours as needed for nausea or vomiting. 01/26/13   Curt Bears, MD    Physical Exam: Filed Vitals:   03/19/2013 2115 03/28/2013 2130 03/29/2013 2200 03/24/2013 2220  BP: 67/49 78/42  58/42  Pulse:      Temp:    97.9 F (36.6 C)  TempSrc:    Core (Comment)  Resp: 30 29 27    SpO2:        General: Alert, Awake  muffling, not following command  Eyes: Pupils are reactive  ENT: Oral Mucosa clear dry. Cardiovascular: S1 and S2 Present, aortic systolic  Murmur, Peripheral Pulses Present Respiratory: Bilateral Air entry equal and Decreased, bilateral Crackles,no  wheezes Abdomen: Bowel Sound Present, Soft and Non tender  Labs on Admission:  CBC: No results found for this basename: WBC, NEUTROABS, HGB, HCT, MCV, PLT,  in the last 168 hours  CMP     Component Value Date/Time   NA 154* 03/26/2013 1852   NA 145 03/03/2013 0806   K 6.0* 03/02/2013 1852   K 4.3 03/03/2013 0806   CL 106 03/29/2013 1852   CO2 15* 03/03/2013 1852   CO2 29 03/03/2013 0806   GLUCOSE 129* 03/27/2013 1852   GLUCOSE 137 03/03/2013 0806   BUN 74* 03/19/2013 1852   BUN 24.7 03/03/2013 0806   CREATININE 4.14* 03/14/2013 1852   CREATININE 1.2 03/03/2013 0806   CALCIUM 9.9 03/07/2013 1852   CALCIUM 9.2 03/03/2013 0806   PROT 7.9 03/04/2013 1852   PROT 6.8 03/03/2013 0806   ALBUMIN 2.9* 03/25/2013 1852   ALBUMIN 3.2* 03/03/2013 0806   AST 61* 03/26/2013 1852   AST 30 03/03/2013 0806   ALT 39 03/29/2013 1852   ALT 90* 03/03/2013 0806   ALKPHOS 77 03/19/2013 1852   ALKPHOS 68 03/03/2013 0806   BILITOT 0.6 03/27/2013 1852   BILITOT 0.28 03/03/2013 0806   GFRNONAA 14* 03/23/2013 1852    GFRAA 16* 03/10/2013 1852    No results found for this basename: LIPASE, AMYLASE,  in the last 168 hours  Recent Labs Lab 03/23/2013 1851  AMMONIA 26    No results found for this basename: CKTOTAL, CKMB, CKMBINDEX, TROPONINI,  in the last 168 hours BNP (last 3 results)  Recent Labs  03/17/2013 1850  PROBNP 442.6*    Radiological Exams on Admission: Ct Cervical Spine Wo Contrast  03/29/2013   CLINICAL DATA:  Fall column altered mental status  EXAM: CT HEAD WITHOUT CONTRAST  CT CERVICAL SPINE WITHOUT CONTRAST  TECHNIQUE: Multidetector CT imaging of the head and cervical spine was performed following the standard protocol without intravenous contrast. Multiplanar CT image reconstructions of the cervical spine were also generated.  COMPARISON:  MR MRA HEAD W/O CM dated 03/08/2013; CT HEAD W/O CM dated 03/07/2013; CT HEAD W/O CM dated 01/02/2013; NM PET IMAGE INITIAL (PI) SKULL BASE TO THIGH dated 01/26/2013  FINDINGS: CT HEAD FINDINGS  There is significant motion degradation on the initial images. Images were repeated with improvement. No intracranial hemorrhage. No parenchymal contusion. No midline shift or mass effect. Basilar cisterns are patent. No skull base fracture. No fluid in the paranasal sinuses or mastoid air cells. Orbits are normal.  There is a large region of hypodensity involving the right frontal parietal white matter and cortex consistent with subacute infarction which was seen on prior MRI.  Generalized cortical atrophy.  There is no evidence of fracture of the skullbase. No fluid in the paranasal sinuses or mastoid air cells. No evidence of fracture of the calvarium.  CT CERVICAL SPINE FINDINGS  No prevertebral soft tissue swelling. Normal alignment of cervical vertebral bodies. No loss of vertebral body height. Normal facet articulation. Normal craniocervical junction.  No evidence epidural or paraspinal hematoma.  There is a gap in the posterior arch of the C1 vertebral body which  either represents remote fracture or congenital nonunion.  IMPRESSION: 1. Evolution of large subacute infarction within the right middle cerebral artery territory. 2. No acute intracranial trauma. 3. No cervical spine fracture   Electronically Signed   By: Suzy Bouchard M.D.   On: 03/07/2013 19:25   Dg Chest Portable 1 View  03/05/2013   CLINICAL DATA:  Altered mental status, hypertension, lung cancer  EXAM: PORTABLE CHEST - 1 VIEW  COMPARISON:  Portable exam 1910 hr compared to 03/09/2013  FINDINGS: Normal heart size, mediastinal contours, and pulmonary vascularity.  Respiratory motion artifacts at lung bases.  Lungs appear mildly hyperinflated but clear.  No definite infiltrate, pleural effusion or pneumothorax.  Bones diffusely demineralized.  IMPRESSION: No acute abnormalities.   Electronically Signed   By: Lavonia Dana M.D.   On: 03/05/2013 19:26    EKG: Independently reviewed. atrial fibrillation, RVR.  Assessment/Plan Principal Problem:   Shock Active Problems:   HTN (hypertension)   Congestive dilated cardiomyopathy   Stroke   Seizure cerebral   Adenocarcinoma of lung, stage 4   Septic shock   Hypernatremia   AKI (acute kidney injury)   1. Shock The patient is presenting with severe hypotension not responding to IV fluids and requiring Levophed, along with that he has acute kidney injury and severe hypernatremia with significant metabolic acidosis as well as respiratory acidosis. Significantly elevated lactic acid levels. and progressively worsening subacute right MCA infarct. With this the prognosis is very poor for this patient, and critical care and ED there has discussed with the family about goals of care. The patient has been made DNR/DNI by the critical care after discussing with the family, and family's goals of care are for comfort without any active management. I discussed the case with patient's son darrel, and discuss the plan at bedside with the family. Patient will  be placed on IV hydration, oxygen as needed, IV morphine and IV Ativan as needed, atropine as needed, eyedrops as needed. Consults: Palliative care  Nutrition: Regular diet as tolerated  Code Status: DNR/DNI  Family Communication: Discuss with son on phone and family who were present at bedside, opportunity was given to ask question  and all questions were answered satisfactorily at the time of interview. Disposition: Admitted to inpatient in med-surge unit.  Author: Berle Mull, MD [facility] Pager: [phone] 03/14/2013, 11:04 PM    If 7PM-7AM, please contact night-coverage [email or web address] Password [email or web address]

## 2013-03-20 NOTE — ED Notes (Signed)
Per MD Lincoln Maxin, Pt is now palliative care. Verbal order to dx levophed. Father and cousin now at bedside; update on pt's plan of care.

## 2013-03-20 NOTE — ED Notes (Signed)
Elevated I-stat trop reproted Dr. Tawnya Crook

## 2013-03-20 NOTE — ED Notes (Signed)
Spoke with [facility]- caretaker reports pt baseline is alert but confused at times, able to carry on conversations per normal.  Pt fell out of bed 3 times today, 2 hours after putting him to bed after his last fall they went into his room and noticed his body to be out of bed but his neck was caught in side rail.

## 2013-03-20 NOTE — ED Notes (Signed)
Admitting MD at bedside.

## 2013-03-20 NOTE — ED Notes (Signed)
Dr. Tawnya Crook notified of elevated CG-4

## 2013-03-20 NOTE — Consult Note (Addendum)
Name: Carlos Beasley MRN: [id number] DOB: 07-09-1945    LOS: 0  Referring Provider:  Dr. 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  In the ER he was found to have a blood pressure of 63 systolic, lactate of 12 and pH of 6.99.  He was started on levophed through a peripheral IV, given 3L  NS and started on vancomycin and zosyn.  The ER physician spoke with the patient's son and confirmed his wishes to be DNR.  His son however lives in [place] and initially requested that attempts be made to keep Carlos Beasley alive until he could get to the bedside early the next morning.  On my exam Carlos Beasley was laying on the stretcher, intermittently moaning and reaching out.  His breathing appeared labored.  He was grimacing.  I spoke with Carlos Beasley niece who lives in [place] and his father who later arrived at the bedside, as well as both of his sons (Jacquenette Shone in [place] and [place] in [place]).  It was decided that we would continue IV fluids and oxygen but that we would not start any further life saving measures as Carlos Beasley is not likely to survive this illness regardless of medical intervention and further heroic efforts are likely to only prolong suffering.    Past Medical History  Diagnosis Date  . Hypertension   . Lung cancer   . Hx of radiation therapy 01/19/13    left parietal, lt cerebellar   Past Surgical History  Procedure Laterality Date  . No past surgeries     Prior to Admission medications   Medication Sig Start Date End Date Taking? Authorizing Provider  acetaminophen (TYLENOL) 325 MG tablet Take 2 tablets (650 mg total) by mouth every 6 (six) hours as needed for mild pain, fever or headache. 03/15/13  Yes Robbie Lis, MD  amLODipine  (NORVASC) 10 MG tablet Take 1 tablet (10 mg total) by mouth daily. 01/09/13  Yes Estela Leonie Green, MD  aspirin EC 325 MG EC tablet Take 1 tablet (325 mg total) by mouth daily. 01/09/13  Yes Estela Leonie Green, MD  feeding supplement (BOOST HIGH PROTEIN) LIQD Take 120 mLs by mouth 3 (three) times daily between meals.   Yes Historical Provider, MD  folic acid (FOLVITE) 1 MG tablet Take 1 tablet (1 mg total) by mouth daily. 01/26/13  Yes Curt Bears, MD  hydrALAZINE (APRESOLINE) 25 MG tablet Take 1 tablet (25 mg total) by mouth every 8 (eight) hours. 01/09/13  Yes Estela Leonie Green, MD  ipratropium-albuterol (DUONEB) 0.5-2.5 (3) MG/3ML SOLN Take 3 mLs by nebulization every 4 (four) hours as needed (for shortness of breath). 03/15/13  Yes Robbie Lis, MD  levETIRAcetam (KEPPRA) 1000 MG tablet Take 1 tablet (1,000 mg total) by mouth 2 (two) times daily. 01/09/13  Yes Estela Leonie Green, MD  levofloxacin (LEVAQUIN) 750 MG tablet Take 1 tablet (750 mg total) by mouth every other day. 03/15/13  Yes Robbie Lis, MD  Morphine Sulfate (MORPHINE CONCENTRATE) 10 mg / 0.5 ml concentrated solution Take 0.25 mLs (5 mg total) by mouth every 4 (four) hours as needed for moderate pain. 03/15/13  Yes Robbie Lis, MD  oxyCODONE-acetaminophen (PERCOCET/ROXICET) 5-325 MG per tablet Take 1 tablet by mouth every 4 (four) hours as needed for severe pain. 03/15/13  Yes Robbie Lis, MD  pantoprazole (PROTONIX) 40 MG tablet Take 1 tablet (40 mg total) by mouth daily. 01/09/13  Yes Estela Leonie Green, MD  senna-docusate (SENOKOT-S) 8.6-50 MG per tablet Take 1 tablet by mouth at bedtime as needed for moderate constipation. 03/15/13  Yes Robbie Lis, MD  tuberculin 5 UNIT/0.1ML injection Inject 5 Units into the skin once.   Yes Historical Provider, MD  zolpidem (AMBIEN) 10 MG tablet Take 1 tablet (10 mg total) by mouth at bedtime as needed for sleep. With 1 refill called rx to [facility] [phone]  03/15/13  Yes Alma Vivi Ferns, MD  prochlorperazine (COMPAZINE) 10 MG tablet Take 1 tablet (10 mg total) by mouth every 6 (six) hours as needed for nausea or vomiting. 01/26/13   Curt Bears, MD   Allergies No Known Allergies  Family History Family History  Problem Relation Age of Onset  . Heart Problems Mother   . Cancer Mother 25    lung cancer  . Cancer Father 31    pancreas cancer   Social History  reports that he has been smoking Cigarettes.  He has a 12.5 pack-year smoking history. He has never used smokeless tobacco. He reports that he drinks alcohol. He reports that he uses illicit drugs (Cocaine).  Review Of Systems:  Review of systems was not able to be obtained as pt cannot answer questions  Brief patient description:  68 y/o man with metastatic adenocarcinoma of the lung, recent stroke and now presenting with likely septic shock.  Events Since Admission: Goals of care to focus on comfort   Vital Signs: Temp:  [95.5 F (35.3 C)-96.6 F (35.9 C)] 95.5 F (35.3 C) (03/20 2058) Pulse Rate:  [30-151] 151 (03/20 2113) Resp:  [18-32] 29 (03/20 2130) BP: (61-135)/(38-78) 78/42 mmHg (03/20 2130) SpO2:  [96 %-100 %] 100 % (03/20 2113)  Physical Examination: General:  Laying on stretcher, appears older than stated age, wrapped in blanket and bair hugger Neuro:  Non-responsive, moaning HEENT:  Arcus silnilis, scleral edema, non-rebreather in place Neck:  supple Cardiovascular:  Tachycardic, regular rhythm, II/VI systolic murmur Lungs:  Tachypneic, coarse breath sounds throughout, no wheezing Abdomen:  Soft, ND Musculoskeletal:  No joint abnormalities, no edema Skin:  No bruising, rashes or other lesions  Active Problems:   Shock   ASSESSMENT AND PLAN I had discussions with multiple family members about Carlos Beasley prognosis including both of his sons, [name] [phone]) and Darryl [phone]).  I expressed my concern that no matter what interventions are  performed [name] will likely not survive this illness and that to try to keep him alive will prolong his suffering and prevent Carlos Beasley from adequately treating pain.  They expressed understanding of this and agreed that the primary goal at this time should be comfort.  I have discontinued the levophed.  I have ordered morphine and ativan for pain and air hunger and after receiving his first dose of morphine, Mr. Tilghman appeared much more comfortable and less air hungry.  [name] is currently boarding a plain and is scheduled to arrive in [place] at 11:30PM.  Darryl will be flying in tomorrow morning.  I think it is reasonable to continue aggressive fluid resuscitation and oxygen therapy for now.  Mr. Demasi will be admitted to the [facility] service.  Please feel free to page me overnight with  any questions or need for clarification.   I spent 35 minutes of critical care time in the care of this patient seperate from procedures which are documented elsewhere  Lincoln Maxin, Mariann Laster., M.D. Pulmonary and [place] Pager: [phone]  03/26/2013, 9:39 PM

## 2013-03-20 NOTE — ED Notes (Signed)
Critical care at bedside. Niece at bedside updated on patients status.

## 2013-03-20 NOTE — ED Provider Notes (Signed)
CSN: [id number]     Arrival date & time 03/13/2013  1815 History   First MD Initiated Contact with Patient 03/07/2013 1822     Chief Complaint  Patient presents with  . Altered Mental Status   HPI 68 year old male with a history of stage IV lung cancer and recently hospitalized with a stroke presents with altered mental status.  The patient is unable to provide any history secondary to altered mental status. All history is obtained per EMS who received report from the nursing home. Reportedly the patient has fallen several times today. Nursing home staff came in the room and found the patient having fallen across a bed with his neck caught in between the bed and the bed rail. When EMS arrived, they found him to be hypotensive with blood pressures in the 60s. He was unresponsive. The were unable to get oxygen saturation. He was tachycardic from the 120s to 200 with A. fib by EKG.  On arrival to the emergency department he is unresponsive, history the tachycardic, hypotensive, tachypneic, in respiratory distress, critically ill.   Past Medical History  Diagnosis Date  . Hypertension   . Lung cancer   . Hx of radiation therapy 01/19/13    left parietal, lt cerebellar   Past Surgical History  Procedure Laterality Date  . No past surgeries     Family History  Problem Relation Age of Onset  . Heart Problems Mother   . Cancer Mother 2    lung cancer  . Cancer Father 6    pancreas cancer   History  Substance Use Topics  . Smoking status: Current Every Day Smoker -- 0.50 packs/day for 25 years    Types: Cigarettes  . Smokeless tobacco: Never Used  . Alcohol Use: Yes     Comment: occasionally    Review of Systems  Unable to perform ROS: Patient unresponsive      Allergies  Review of patient's allergies indicates no known allergies.  Home Medications   Current Outpatient Rx  Name  Route  Sig  Dispense  Refill  . acetaminophen (TYLENOL) 325 MG tablet   Oral   Take 2  tablets (650 mg total) by mouth every 6 (six) hours as needed for mild pain, fever or headache.   30 tablet   0   . amLODipine (NORVASC) 10 MG tablet   Oral   Take 1 tablet (10 mg total) by mouth daily.   30 tablet   1   . aspirin EC 325 MG EC tablet   Oral   Take 1 tablet (325 mg total) by mouth daily.   30 tablet   0   . feeding supplement (BOOST HIGH PROTEIN) LIQD   Oral   Take 120 mLs by mouth 3 (three) times daily between meals.         . folic acid (FOLVITE) 1 MG tablet   Oral   Take 1 tablet (1 mg total) by mouth daily.   30 tablet   3   . hydrALAZINE (APRESOLINE) 25 MG tablet   Oral   Take 1 tablet (25 mg total) by mouth every 8 (eight) hours.   90 tablet   1   . ipratropium-albuterol (DUONEB) 0.5-2.5 (3) MG/3ML SOLN   Nebulization   Take 3 mLs by nebulization every 4 (four) hours as needed (for shortness of breath).   360 mL   0   . levETIRAcetam (KEPPRA) 1000 MG tablet   Oral   Take 1 tablet (  1,000 mg total) by mouth 2 (two) times daily.   60 tablet   1   . levofloxacin (LEVAQUIN) 750 MG tablet   Oral   Take 1 tablet (750 mg total) by mouth every other day.   3 tablet   0   . Morphine Sulfate (MORPHINE CONCENTRATE) 10 mg / 0.5 ml concentrated solution   Oral   Take 0.25 mLs (5 mg total) by mouth every 4 (four) hours as needed for moderate pain.   30 mL   0   . oxyCODONE-acetaminophen (PERCOCET/ROXICET) 5-325 MG per tablet   Oral   Take 1 tablet by mouth every 4 (four) hours as needed for severe pain.   10 tablet   0   . pantoprazole (PROTONIX) 40 MG tablet   Oral   Take 1 tablet (40 mg total) by mouth daily.   30 tablet   1   . senna-docusate (SENOKOT-S) 8.6-50 MG per tablet   Oral   Take 1 tablet by mouth at bedtime as needed for moderate constipation.   30 tablet   0   . tuberculin 5 UNIT/0.1ML injection   Intradermal   Inject 5 Units into the skin once.         Marland Kitchen zolpidem (AMBIEN) 10 MG tablet   Oral   Take 1 tablet (10  mg total) by mouth at bedtime as needed for sleep. With 1 refill called rx to [facility] [phone]   30 tablet   0   . prochlorperazine (COMPAZINE) 10 MG tablet   Oral   Take 1 tablet (10 mg total) by mouth every 6 (six) hours as needed for nausea or vomiting.   60 tablet   0    BP 58/37  Pulse 134  Temp(Src) 98.4 F (36.9 C) (Axillary)  Resp 28  Ht 6\' 2"  (1.88 m)  Wt 115 lb 1.3 oz (52.2 kg)  BMI 14.77 kg/m2  SpO2 72% Physical Exam  Nursing note and vitals reviewed. Constitutional: He appears well-developed. He appears distressed.  Cachectic, in respiratory distress, toxic appearing  HENT:  Head: Normocephalic and atraumatic.  Eyes: Conjunctivae are normal. Pupils are equal, round, and reactive to light. No scleral icterus.  Neck: Neck supple. No tracheal deviation present.  Cardiovascular: An irregularly irregular rhythm present. Tachycardia present.  Exam reveals no gallop and no friction rub.   No murmur heard. Pulses:      Radial pulses are 1+ on the right side, and 1+ on the left side.  Pulmonary/Chest: No stridor. Tachypnea noted. He is in respiratory distress. He has no decreased breath sounds. He has no rhonchi.  Abdominal: Soft. He exhibits no distension. There is no tenderness. There is no rebound and no guarding.  Musculoskeletal: He exhibits no tenderness.  Neurological: GCS eye subscore is 2. GCS verbal subscore is 3. GCS motor subscore is 6.  Awake, lethargic, not oriented to place, time, or situation.  Follows commands with right uppper and right lower extremitiy (wiggles toes, squeezes fingers) but not moving his LUE and LLE. PERRL.  Skin: Skin is warm and dry.    ED Course  Procedures (including critical care time) Labs Review Labs Reviewed  PRO B NATRIURETIC PEPTIDE - Abnormal; Notable for the following:    Pro B Natriuretic peptide (BNP) 442.6 (*)    All other components within normal limits  LACTIC ACID, PLASMA - Abnormal; Notable for the  following:    Lactic Acid, Venous 11.5 (*)    All other components within  normal limits  COMPREHENSIVE METABOLIC PANEL - Abnormal; Notable for the following:    Sodium 154 (*)    Potassium 6.0 (*)    CO2 15 (*)    Glucose, Bld 129 (*)    BUN 74 (*)    Creatinine, Ser 4.14 (*)    Albumin 2.9 (*)    AST 61 (*)    GFR calc non Af Amer 14 (*)    GFR calc Af Amer 16 (*)    All other components within normal limits  PROTIME-INR - Abnormal; Notable for the following:    Prothrombin Time 17.5 (*)    All other components within normal limits  URINALYSIS, ROUTINE W REFLEX MICROSCOPIC - Abnormal; Notable for the following:    Color, Urine AMBER (*)    APPearance HAZY (*)    Bilirubin Urine SMALL (*)    Ketones, ur 15 (*)    Protein, ur 30 (*)    Nitrite POSITIVE (*)    Leukocytes, UA TRACE (*)    All other components within normal limits  URINE MICROSCOPIC-ADD ON - Abnormal; Notable for the following:    Bacteria, UA FEW (*)    Casts HYALINE CASTS (*)    All other components within normal limits  I-STAT CG4 LACTIC ACID, ED - Abnormal; Notable for the following:    Lactic Acid, Venous 12.08 (*)    All other components within normal limits  I-STAT TROPOININ, ED - Abnormal; Notable for the following:    Troponin i, poc 0.13 (*)    All other components within normal limits  I-STAT VENOUS BLOOD GAS, ED - Abnormal; Notable for the following:    pH, Ven 6.993 (*)    pCO2, Ven 64.0 (*)    pO2, Ven 22.0 (*)    Bicarbonate 16.6 (*)    Acid-base deficit 15.0 (*)    All other components within normal limits  CULTURE, BLOOD (ROUTINE X 2)  CULTURE, BLOOD (ROUTINE X 2)  URINE CULTURE  AMMONIA  CBG MONITORING, ED  CBG MONITORING, ED  TYPE AND SCREEN  ABO/RH   Imaging Review Ct Cervical Spine Wo Contrast  03/19/2013   CLINICAL DATA:  Fall column altered mental status  EXAM: CT HEAD WITHOUT CONTRAST  CT CERVICAL SPINE WITHOUT CONTRAST  TECHNIQUE: Multidetector CT imaging of the head and  cervical spine was performed following the standard protocol without intravenous contrast. Multiplanar CT image reconstructions of the cervical spine were also generated.  COMPARISON:  MR MRA HEAD W/O CM dated 03/08/2013; CT HEAD W/O CM dated 03/07/2013; CT HEAD W/O CM dated 01/02/2013; NM PET IMAGE INITIAL (PI) SKULL BASE TO THIGH dated 01/26/2013  FINDINGS: CT HEAD FINDINGS  There is significant motion degradation on the initial images. Images were repeated with improvement. No intracranial hemorrhage. No parenchymal contusion. No midline shift or mass effect. Basilar cisterns are patent. No skull base fracture. No fluid in the paranasal sinuses or mastoid air cells. Orbits are normal.  There is a large region of hypodensity involving the right frontal parietal white matter and cortex consistent with subacute infarction which was seen on prior MRI.  Generalized cortical atrophy.  There is no evidence of fracture of the skullbase. No fluid in the paranasal sinuses or mastoid air cells. No evidence of fracture of the calvarium.  CT CERVICAL SPINE FINDINGS  No prevertebral soft tissue swelling. Normal alignment of cervical vertebral bodies. No loss of vertebral body height. Normal facet articulation. Normal craniocervical junction.  No evidence epidural or  paraspinal hematoma.  There is a gap in the posterior arch of the C1 vertebral body which either represents remote fracture or congenital nonunion.  IMPRESSION: 1. Evolution of large subacute infarction within the right middle cerebral artery territory. 2. No acute intracranial trauma. 3. No cervical spine fracture   Electronically Signed   By: Suzy Bouchard M.D.   On: 03/23/2013 19:25   Dg Chest Portable 1 View  03/12/2013   CLINICAL DATA:  Altered mental status, hypertension, lung cancer  EXAM: PORTABLE CHEST - 1 VIEW  COMPARISON:  Portable exam 1910 hr compared to 03/09/2013  FINDINGS: Normal heart size, mediastinal contours, and pulmonary vascularity.   Respiratory motion artifacts at lung bases.  Lungs appear mildly hyperinflated but clear.  No definite infiltrate, pleural effusion or pneumothorax.  Bones diffusely demineralized.  IMPRESSION: No acute abnormalities.   Electronically Signed   By: Lavonia Dana M.D.   On: 03/09/2013 19:26     EKG Interpretation   Date/Time:  Friday March 20 2013 18:21:35 EDT Ventricular Rate:  164 PR Interval:  132 QRS Duration: 86 QT Interval:  296 QTC Calculation: 489 R Axis:   89 sodium 154, potassium 6, bicarbonate 15, creatinine 4.14, lactate 11.5 Text Interpretation:  Afib with RVR Multiple premature complexes, vent   Borderline right axis deviation Consider left ventricular hypertrophy  Nonspecific T abnormalities, lateral leads B Confirmed by DOCHERTY  MD,  MEGAN [id number]) on 03/30/2013 8:13:08 PM      MDM   68 year old male with stage IV lung cancer presents after being found down at a nursing home, after multiple falls. Hypotensive, tachycardic to 200, in A. fib with RVR, in respiratory distress. He was recently hospitalized with a stroke and has left residual hemiparesis. He is unable to contribute to history.  On arrival blood and urine cultures obtained. He is given prospect of antibiotics, vancomycin and Zosyn. He was resuscitated with several liters of IV fluid. Briefly he was started on Levaquin for hypertension.  Workup pertinent for head CT with no acute intracranial abnormality, chest x-ray with no acute cardiopulmonary normality, EKGs with A. fib with RVR, sodium 154, potassium 6, bicarbonate 16, creatinine 4.1, lactate 11.5, troponin mildly elevated, pH 6.9, PCO2 64.  Patient has end-stage cancer. He is critically ill and very unlikely to survive this acute illness. He has multiorgan failure including renal failure with profound acidosis and lactic acidosis. Septic shock is most likely.  Patient is DO NOT RESUSCITATE DO NOT INTUBATE based on prior stated wishes, documented there during  his prior hospitalization. This was discussed with his son who is currently in [place]. The son confirms that these were his father's wishes. After thorough discussion with the son in multiple family members as well as involvement by critical care, it was determined that the patient is to be DNR/DNI with comfort measures only. Pressors were withdrawn. Patient is expected to expire in the short term. He is admitted to [facility] and palliative care will be involved.   Final diagnoses:  Shock  Septic shock  Hypernatremia  AKI (acute kidney injury)  Adenocarcinoma of lung, stage 4  Stroke  Metabolic acidosis  Lactic acidosis      Wendall Papa, MD 27-Mar-2013 1245

## 2013-03-20 NOTE — ED Notes (Signed)
Chaplain and family at bedside.

## 2013-03-20 NOTE — ED Notes (Addendum)
Pt to ED via [facility] from [facility] home for evaluation of altered mental status.  Staff reports pt fell twice today, staff member walked by room and noticed pt body out of bed and his neck caught in the side rail- pt moaning upon arrival to ED, tachycardic upon arrival- fully immobilized at this time.

## 2013-03-22 LAB — URINE CULTURE
Colony Count: NO GROWTH
Culture: NO GROWTH

## 2013-03-22 LAB — ABO/RH: ABO/RH(D): AB POS

## 2013-03-22 NOTE — ED Provider Notes (Signed)
Medical screening examination/treatment/procedure(s) were conducted as a shared visit with resident-physician practitioner(s) and myself.  I personally evaluated the patient during the encounter.  Pt is a 68 y.o. male with pmhx as above presenting with multiple falls today at SNF with AMS, hypotension, afib with RVR, in acute distress.  Pt found to have severe metabolic acidosis, LA 53.9, Cr 4.1.  Pt has metastatic lung ca w/o known brain mets, with signs of multiorgan failure due to septic shock.  He is known DNR/DNI.  Multiple phone calls made to pt's son in [place] about pt's critical condition.  In discussion with CCM, pt will be made comfort care.  [facility] will admit.   EKG Interpretation  Date/Time:  Friday March 20 2013 18:21:35 EDT Ventricular Rate:  164 PR Interval:  132 QRS Duration: 86 QT Interval:  296 QTC Calculation: 489 R Axis:   89 Text Interpretation:  Afib with RVR Multiple premature complexes, vent  Borderline right axis deviation Consider left ventricular hypertrophy Nonspecific T abnormalities, lateral leads B Confirmed by DOCHERTY  MD, MEGAN ([id number]) on 03/09/2013 8:13:08 PM       CRITICAL CARE Performed by: Ernestina Patches, E Total critical care time: 40 Critical care time was exclusive of separately billable procedures and treating other patients. Critical care was necessary to treat or prevent imminent or life-threatening deterioration. Critical care was time spent personally by me on the following activities: development of treatment plan with patient and/or surrogate as well as nursing, discussions with consultants, evaluation of patient's response to treatment, examination of patient, obtaining history from patient or surrogate, ordering and performing treatments and interventions, ordering and review of laboratory studies, ordering and review of radiographic studies, pulse oximetry and re-evaluation of patient's condition.    Neta Ehlers, MD 03/22/13 1209

## 2013-03-23 NOTE — Discharge Summary (Signed)
Physician Discharge Summary  Carlos Beasley:[id number] DOB: [birth year] DOA: 29-Mar-2013  PCP: No primary provider on file.  Admit date: 03-29-2013 Date and time of death: 03-30-2013 12:00 AM  Discharge Diagnoses:  Principal Problem:   Shock Active Problems:   HTN (hypertension)   Congestive dilated cardiomyopathy   Stroke   Seizure cerebral   Adenocarcinoma of lung, stage 4   Septic shock   Hypernatremia   AKI (acute kidney injury)   Hospital Course:  The patient presented with altered mental status and a possible fall. On arrival he was found to have severe hypernatremia, acute kidney injury, progressively worsening subacute right MCA infarct, severe hypotension and shock requiring IV pressors, combined metabolic and respiratory acidosis. With all the comorbidities the patient was initially consulted with critical care. Critical care discussed with the family and the family decided to make the patient DNR/DNI and comfort care without any active treatment for above-mentioned condition. Patient was admitted to the hospital placed on IV hydration, oxygen as needed, IV and nonspecific and IV anxiolytic as needed. Admit night 12 AM the patient was found unresponsive, on call floor personal nurse practitioner Ms. Donnal Debar was called who after evaluating the patient pronounced the patient dead. Date and time of death: Mar 30, 2013 12:00 AM  Significant Diagnostic Studies:  30-Mar-2013   : CLINICAL DATA: Fall column altered mental status  EXAM: CT HEAD WITHOUT CONTRAST  CT CERVICAL SPINE WITHOUT CONTRAST  TECHNIQUE: Multidetector CT imaging of the head and cervical spine was performed following the standard protocol without intravenous contrast. Multiplanar CT image reconstructions of the cervical spine were also generated.  COMPARISON: MR MRA HEAD W/O CM dated 03/08/2013; CT HEAD W/O CM dated 03/07/2013; CT HEAD W/O CM dated 01/02/2013; NM PET IMAGE INITIAL (PI) SKULL BASE TO THIGH dated 01/26/2013  FINDINGS:  CT HEAD FINDINGS  There is significant motion degradation on the initial images. Images were repeated with improvement. No intracranial hemorrhage. No parenchymal contusion. No midline shift or mass effect. Basilar cisterns are patent. No skull base fracture. No fluid in the paranasal sinuses or mastoid air cells. Orbits are normal.  There is a large region of hypodensity involving the right frontal parietal white matter and cortex consistent with subacute infarction which was seen on prior MRI.  Generalized cortical atrophy.  There is no evidence of fracture of the skullbase. No fluid in the paranasal sinuses or mastoid air cells. No evidence of fracture of the calvarium.  CT CERVICAL SPINE FINDINGS  No prevertebral soft tissue swelling. Normal alignment of cervical vertebral bodies. No loss of vertebral body height. Normal facet articulation. Normal craniocervical junction.  No evidence epidural or paraspinal hematoma.  There is a gap in the posterior arch of the C1 vertebral body which either represents remote fracture or congenital nonunion.  IMPRESSION: 1. Evolution of large subacute infarction within the right middle cerebral artery territory. 2. No acute intracranial trauma. 3. No cervical spine fracture   Electronically Signed   By: Suzy Bouchard M.D.   On: 30-Mar-2013 01:07   Ct Cervical Spine Wo Contrast  03-29-13   CLINICAL DATA:  Fall column altered mental status  EXAM: CT HEAD WITHOUT CONTRAST  CT CERVICAL SPINE WITHOUT CONTRAST  TECHNIQUE: Multidetector CT imaging of the head and cervical spine was performed following the standard protocol without intravenous contrast. Multiplanar CT image reconstructions of the cervical spine were also generated.  COMPARISON:  MR MRA HEAD W/O CM dated 03/08/2013; CT HEAD W/O CM dated 03/07/2013; CT HEAD  W/O CM dated 01/02/2013; NM PET IMAGE INITIAL (PI) SKULL BASE TO THIGH dated 01/26/2013  FINDINGS: CT HEAD FINDINGS  There is significant motion degradation on the  initial images. Images were repeated with improvement. No intracranial hemorrhage. No parenchymal contusion. No midline shift or mass effect. Basilar cisterns are patent. No skull base fracture. No fluid in the paranasal sinuses or mastoid air cells. Orbits are normal.  There is a large region of hypodensity involving the right frontal parietal white matter and cortex consistent with subacute infarction which was seen on prior MRI.  Generalized cortical atrophy.  There is no evidence of fracture of the skullbase. No fluid in the paranasal sinuses or mastoid air cells. No evidence of fracture of the calvarium.  CT CERVICAL SPINE FINDINGS  No prevertebral soft tissue swelling. Normal alignment of cervical vertebral bodies. No loss of vertebral body height. Normal facet articulation. Normal craniocervical junction.  No evidence epidural or paraspinal hematoma.  There is a gap in the posterior arch of the C1 vertebral body which either represents remote fracture or congenital nonunion.  IMPRESSION: 1. Evolution of large subacute infarction within the right middle cerebral artery territory. 2. No acute intracranial trauma. 3. No cervical spine fracture   Electronically Signed   By: Suzy Bouchard M.D.   On: 03/25/2013 19:25   Dg Chest Portable 1 View  03/08/2013   CLINICAL DATA:  Altered mental status, hypertension, lung cancer  EXAM: PORTABLE CHEST - 1 VIEW  COMPARISON:  Portable exam 1910 hr compared to 03/09/2013  FINDINGS: Normal heart size, mediastinal contours, and pulmonary vascularity.  Respiratory motion artifacts at lung bases.  Lungs appear mildly hyperinflated but clear.  No definite infiltrate, pleural effusion or pneumothorax.  Bones diffusely demineralized.  IMPRESSION: No acute abnormalities.   Electronically Signed   By: Lavonia Dana M.D.   On: 03/26/2013 19:26    Labs: Basic Metabolic Panel:  Recent Labs Lab 03/16/2013 1852  NA 154*  K 6.0*  CL 106  CO2 15*  GLUCOSE 129*  BUN 74*   CREATININE 4.14*  CALCIUM 9.9   Liver Function Tests:  Recent Labs Lab 03/01/2013 1852  AST 61*  ALT 39  ALKPHOS 77  BILITOT 0.6  PROT 7.9  ALBUMIN 2.9*   No results found for this basename: LIPASE, AMYLASE,  in the last 168 hours  Recent Labs Lab 03/28/2013 1851  AMMONIA 26    Signed: Author: Berle Mull, MD [facility] Pager: [phone] 03/23/2013 10:28 PM

## 2013-03-24 ENCOUNTER — Other Ambulatory Visit: Payer: Medicare Other

## 2013-03-27 LAB — CULTURE, BLOOD (ROUTINE X 2)
Culture: NO GROWTH
Culture: NO GROWTH

## 2013-04-01 NOTE — Progress Notes (Signed)
Responded to ER room B15 to support patient's family. Patient actively dying.  Provided comfort care as well as emotional and spiritual support. Facilitated moving family back and forth between waiting area and bedside. Prayed with family. Dorris Fetch, Chaplain

## 2013-04-01 DEATH — deceased

## 2013-04-03 NOTE — Procedures (Signed)
  Name: ISAIC SYLER  MRN: [id number]  Date: 01/19/13   DOB: March 10, 1945  Stereotactic Radiosurgery Operative Note  PRE-OPERATIVE DIAGNOSIS:  Multiple Brain Metastases  POST-OPERATIVE DIAGNOSIS:  Multiple Brain Metastases  PROCEDURE:  Stereotactic Radiosurgery  SURGEON:  Gabrial Domine P, MD  NARRATIVE: The patient underwent a radiation treatment planning session in the radiation oncology simulation suite under the care of the radiation oncology physician and physicist.  I participated closely in the radiation treatment planning afterwards. The patient underwent planning CT which was fused to 3T high resolution MRI with 1 mm axial slices.  These images were fused on the planning system.  We contoured the gross target volumes and subsequently expanded this to yield the Planning Target Volume. I actively participated in the planning process.  I helped to define and review the target contours and also the contours of the optic pathway, eyes, brainstem and selected nearby organs at risk.  All the dose constraints for critical structures were reviewed and compared to AAPM Task Group 101.  The prescription dose conformity was reviewed.  I approved the plan electronically.    Accordingly, Arnetha Gula was brought to the TrueBeam stereotactic radiation treatment linac and placed in the custom immobilization mask.  The patient was aligned according to the IR fiducial markers with BrainLab Exactrac, then orthogonal x-rays were used in ExacTrac with the 6DOF robotic table and the shifts were made to align the patient  Arnetha Gula received stereotactic radiosurgery uneventfully.    Lesions treated:  2   Complex lesions treated:  0 (>3.5 cm, <47mm of optic path, or within the brainstem)   The detailed description of the procedure is recorded in the radiation oncology procedure note.  I was present for the duration of the procedure.  DISPOSITION:  Following delivery, the patient was transported to nursing  in stable condition and monitored for possible acute effects to be discharged to home in stable condition with follow-up in one month.  Jacobb Alen P, MD 04/03/2013 9:15 AM

## 2013-04-07 ENCOUNTER — Ambulatory Visit: Payer: Medicare Other

## 2013-08-28 ENCOUNTER — Other Ambulatory Visit: Payer: Self-pay | Admitting: *Deleted

## 2015-06-06 IMAGING — CT CT CHEST W/ CM
1 of 3 series · 13 of 29 positions shown, 17 images · IV contrast (APPLIED)
Comparison: US SCROTUM dated 09/08/2007; MR HEAD WO/W CM dated
01/03/2013

CLINICAL DATA: Brain lesion on comparison MRI. Concern for
metastatic disease.

EXAM:
CT CHEST, ABDOMEN, AND PELVIS WITH CONTRAST
TECHNIQUE: Multidetector CT imaging of the chest, abdomen and pelvis was
performed following the standard protocol during bolus
administration of intravenous contrast.
CONTRAST:  100mL OMNIPAQUE IOHEXOL 300 MG/ML  SOLN

[Series 3: delay 5.0 i31f 1 · axial · delayed · 0.73mm/px · z∈[-1071,-906]mm · 13 of 41 slices shown, 17 images]
[im 4/41  mediastinal]
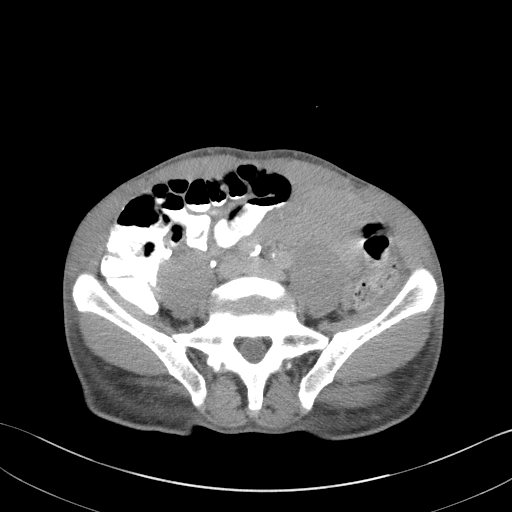
[im 4/41  lung]
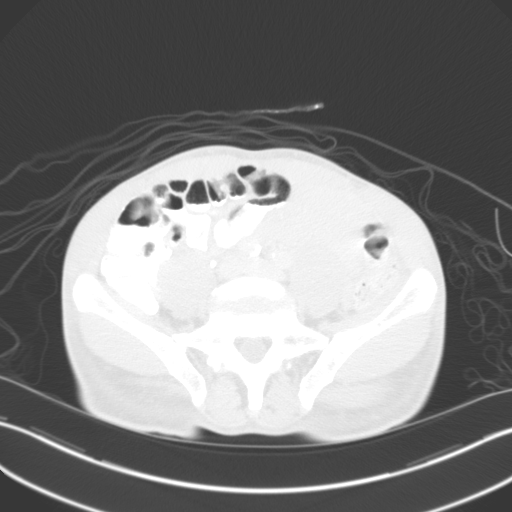
[im 7/41  lung]
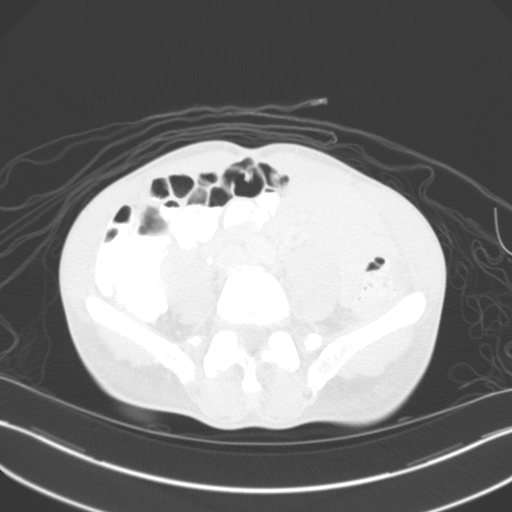
[im 10/41  lung]
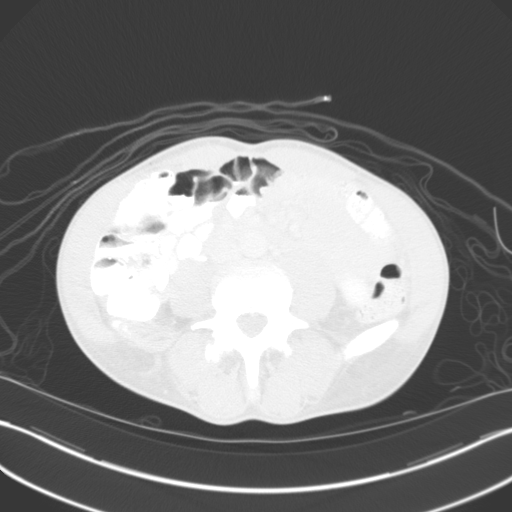
[im 13/41  lung]
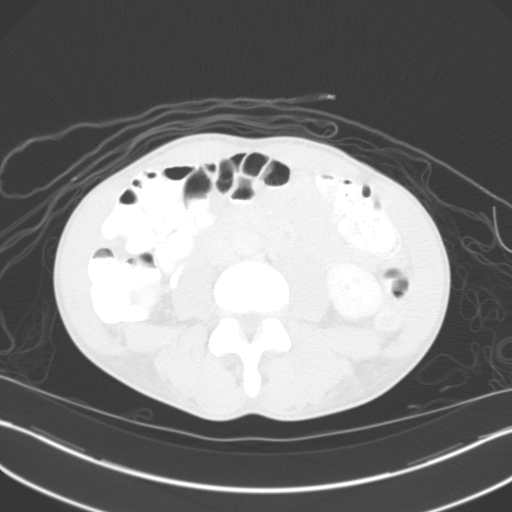
[im 16/41  mediastinal]
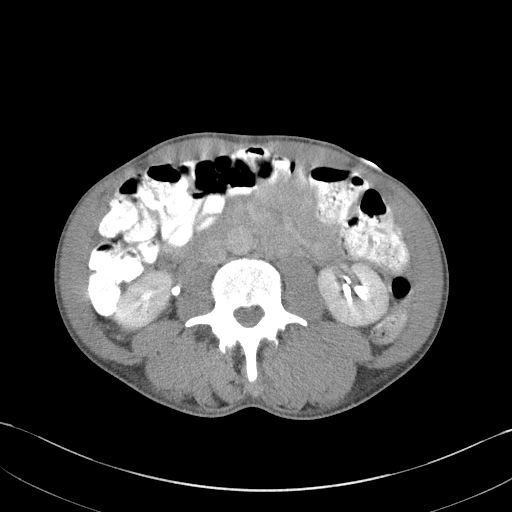
[im 16/41  lung]
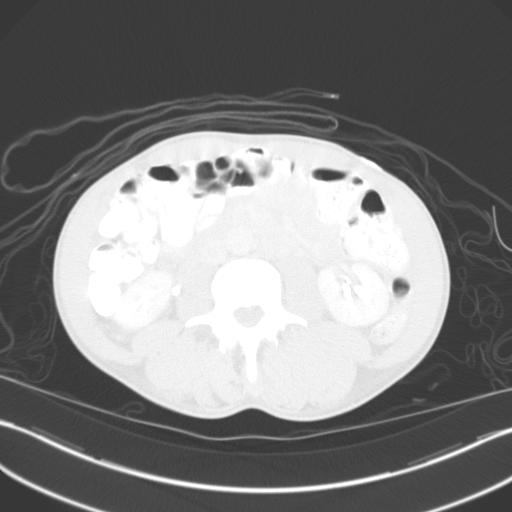
[im 19/41  lung]
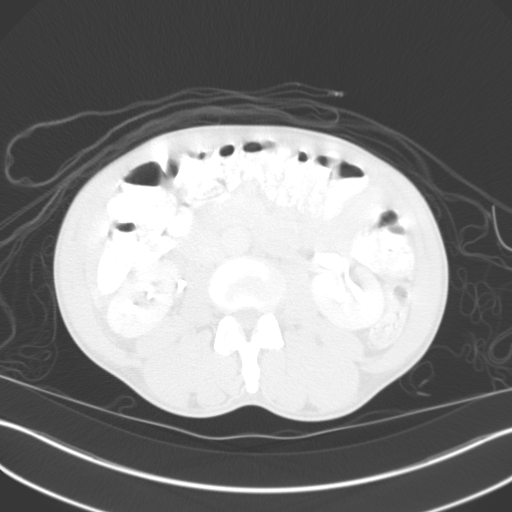
[im 21/41  lung]
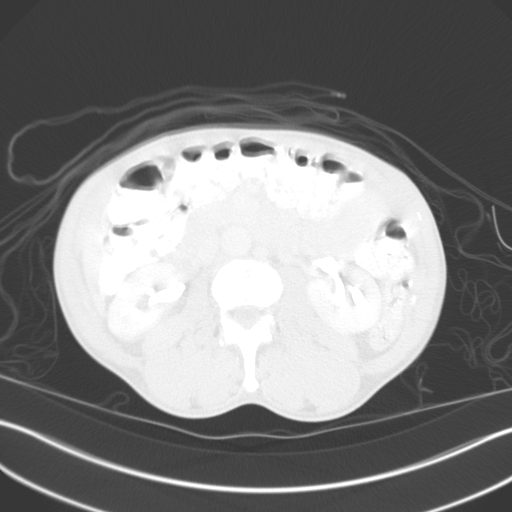
[im 22/41  lung]
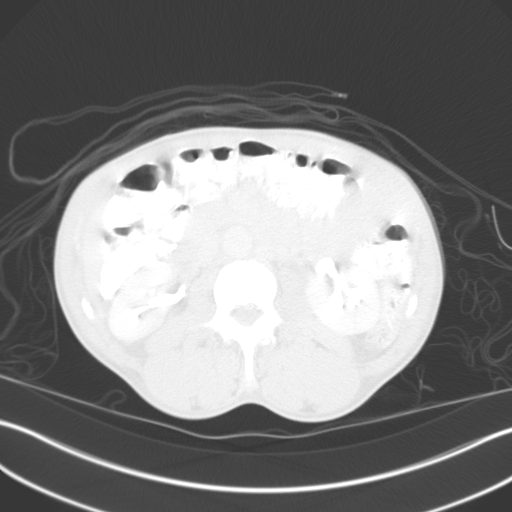
[im 25/41  mediastinal]
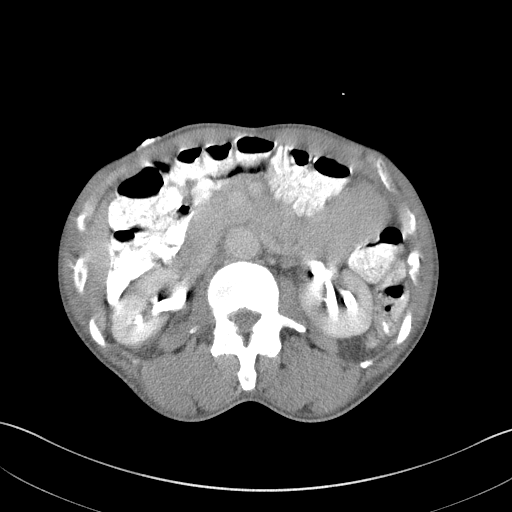
[im 25/41  lung]
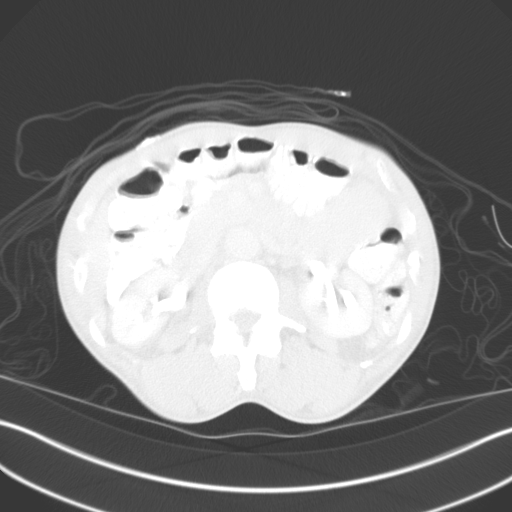
[im 28/41  lung]
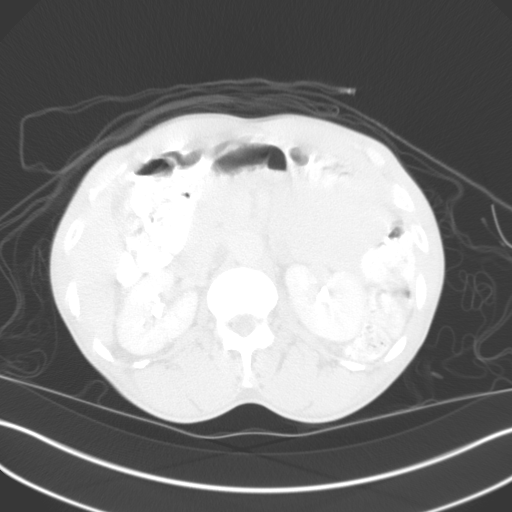
[im 31/41  lung]
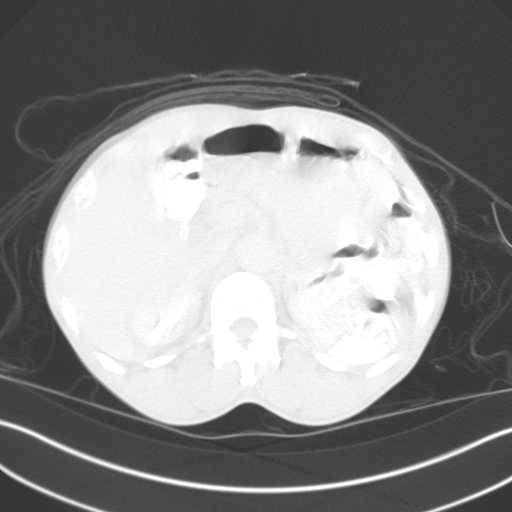
[im 34/41  lung]
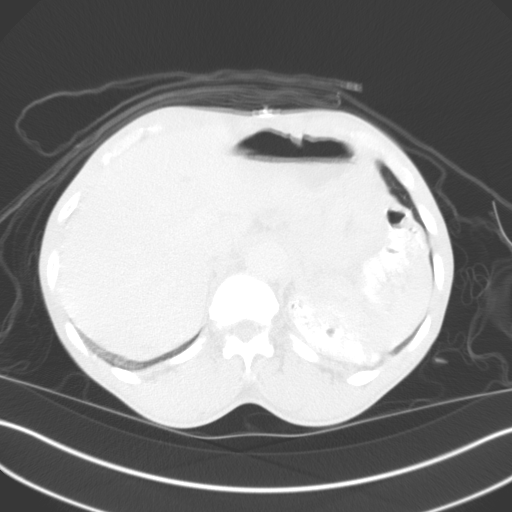
[im 37/41  mediastinal]
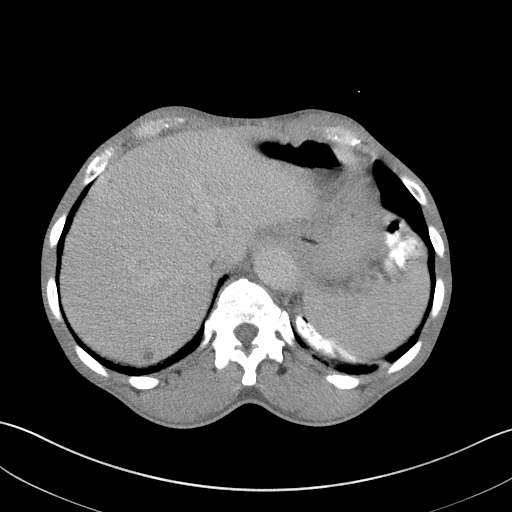
[im 37/41  lung]
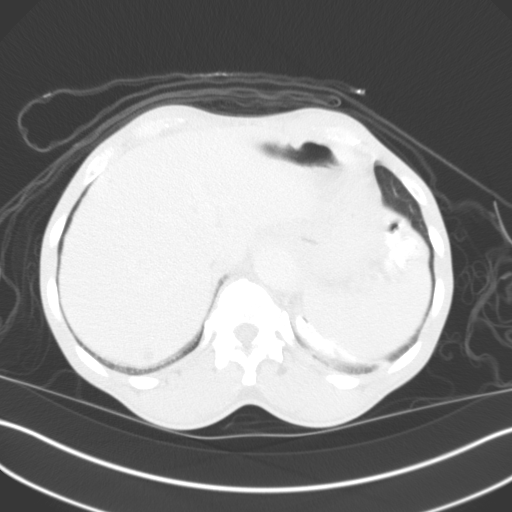

[13 of 29 positions shown; findings below may reference images not displayed]

FINDINGS: CT CHEST FINDINGS

There is little body fat in the patient's thorax . No axillary
lymphadenopathy. Potential right supraclavicular lymph node
measuring 16 mm (image number 1, series 5). There is enlarged right
lower paratracheal lymph node measuring 15 mm short axis. Left lower
paratracheal lymph node measures 12 mm. There is a large subcarinal
lymph node measuring 17 mm short axis. Prominent right hilar lymph
node measures 10 mm (image 25, series 2).

Review of the lung parenchyma demonstrates a spiculated nodule at
the superior aspect of the right upper lobe measuring 7 x 7 mm
(image 11, is series 6). This is also seen on image number 2 of
series 4. No additional suspicious pulmonary nodules. Central
airways are normal.

CT ABDOMEN AND PELVIS FINDINGS

Low-density lesion the posterior right hepatic lobe has simple fluid
attenuation consistent with benign cyst. 3 mm hypodense lesion in
the central left hepatic lobe (image 52) is too small to
characterize. The gallbladder, pancreas, spleen, adrenal glands, and
kidneys are normal.

Stomach, small bowel, and colon are unremarkable.

Abdominal aorta is normal caliber. No retroperitoneal periportal
lymphadenopathy.

No free fluid the pelvis. The prostate gland and bladder normal. No
pelvic lymphadenopathy. No aggressive osseous lesion.
IMPRESSION: 1. Mediastinal lymphadenopathy is concerning for metastatic
adenopathy.
2. Small right upper lobe spiculated nodule may represent primary
bronchogenic carcinoma.
3. Potential right supraclavicular enlarged lymph node. Ultrasound
evaluation could delineate this lymph node.
4. Low-density lesions in the liver likely represent benign cysts.
The smaller cyst is too small to characterize.
5. Patient may ultimately benefit from an outpatient FDG PET-CT
staging exam.
